# Patient Record
Sex: Female | Born: 1970 | Race: Black or African American | Hispanic: No | Smoking: Never smoker
Health system: Southern US, Community
[De-identification: ages and names within clinical notes are randomized; demographics above are authoritative.]

## PROBLEM LIST (undated history)

## (undated) VITALS — BP 117/77 | HR 64 | Temp 98.4°F | Resp 16

## (undated) VITALS — BP 111/68 | HR 95 | Temp 97.7°F | Resp 18

## (undated) DIAGNOSIS — G629 Polyneuropathy, unspecified: Secondary | ICD-10-CM

## (undated) DIAGNOSIS — D219 Benign neoplasm of connective and other soft tissue, unspecified: Secondary | ICD-10-CM

## (undated) DIAGNOSIS — E559 Vitamin D deficiency, unspecified: Secondary | ICD-10-CM

## (undated) DIAGNOSIS — K219 Gastro-esophageal reflux disease without esophagitis: Secondary | ICD-10-CM

## (undated) DIAGNOSIS — F325 Major depressive disorder, single episode, in full remission: Secondary | ICD-10-CM

## (undated) DIAGNOSIS — G988 Other disorders of nervous system: Secondary | ICD-10-CM

## (undated) DIAGNOSIS — I10 Essential (primary) hypertension: Secondary | ICD-10-CM

## (undated) DIAGNOSIS — I8290 Acute embolism and thrombosis of unspecified vein: Secondary | ICD-10-CM

## (undated) DIAGNOSIS — D649 Anemia, unspecified: Secondary | ICD-10-CM

## (undated) DIAGNOSIS — E669 Obesity, unspecified: Secondary | ICD-10-CM

## (undated) DIAGNOSIS — Z923 Personal history of irradiation: Secondary | ICD-10-CM

## (undated) DIAGNOSIS — M75100 Unspecified rotator cuff tear or rupture of unspecified shoulder, not specified as traumatic: Secondary | ICD-10-CM

## (undated) DIAGNOSIS — Z803 Family history of malignant neoplasm of breast: Secondary | ICD-10-CM

## (undated) DIAGNOSIS — I890 Lymphedema, not elsewhere classified: Secondary | ICD-10-CM

## (undated) DIAGNOSIS — C50919 Malignant neoplasm of unspecified site of unspecified female breast: Secondary | ICD-10-CM

## (undated) DIAGNOSIS — Z800 Family history of malignant neoplasm of digestive organs: Secondary | ICD-10-CM

## (undated) DIAGNOSIS — Z9221 Personal history of antineoplastic chemotherapy: Secondary | ICD-10-CM

## (undated) HISTORY — DX: Polyneuropathy, unspecified: G62.9

## (undated) HISTORY — DX: Family history of malignant neoplasm of digestive organs: Z80.0

## (undated) HISTORY — DX: Unspecified rotator cuff tear or rupture of unspecified shoulder, not specified as traumatic: M75.100

## (undated) HISTORY — PX: ABDOMINAL HYSTERECTOMY: SUR658

## (undated) HISTORY — PX: LYMPHADENECTOMY: SHX15

## (undated) HISTORY — DX: Family history of malignant neoplasm of breast: Z80.3

## (undated) HISTORY — PX: TUBAL LIGATION: SHX77

## (undated) HISTORY — PX: BREAST LUMPECTOMY: SHX2

## (undated) HISTORY — DX: Lymphedema, not elsewhere classified: I89.0

## (undated) HISTORY — DX: Other disorders of nervous system: G98.8

## (undated) HISTORY — DX: Major depressive disorder, single episode, in full remission: F32.5

## (undated) HISTORY — DX: Essential (primary) hypertension: I10

## (undated) HISTORY — DX: Obesity, unspecified: E66.9

## (undated) HISTORY — DX: Vitamin D deficiency, unspecified: E55.9

## (undated) HISTORY — DX: Benign neoplasm of connective and other soft tissue, unspecified: D21.9

---

## 1997-02-17 HISTORY — PX: TUBAL LIGATION: SHX77

## 2001-02-17 DIAGNOSIS — B009 Herpesviral infection, unspecified: Secondary | ICD-10-CM

## 2001-02-17 HISTORY — DX: Herpesviral infection, unspecified: B00.9

## 2002-04-30 ENCOUNTER — Emergency Department: Admit: 2002-04-30 | Payer: Self-pay | Source: Emergency Department | Admitting: Emergency Medical Services

## 2006-02-17 HISTORY — PX: SALPINGOOPHORECTOMY: SHX82

## 2006-02-17 HISTORY — PX: HYSTERECTOMY: SHX81

## 2006-03-23 ENCOUNTER — Ambulatory Visit
Admit: 2006-03-23 | Disposition: A | Discharge status: Home / Self Care | Payer: Self-pay | Source: Ambulatory Visit | Admitting: Gynecologic Oncology

## 2006-03-23 LAB — CBC- CERNER
Hematocrit: 24.2 % — ABNORMAL LOW (ref 37.0–47.0)
Hgb: 7.8 G/DL — ABNORMAL LOW (ref 12.0–16.0)
MCH: 22 PG — ABNORMAL LOW (ref 28.0–32.0)
MCHC: 32.2 G/DL (ref 32.0–36.0)
MCV: 68.4 FL — ABNORMAL LOW (ref 80.0–100.0)
MPV: 8.2 FL (ref 7.4–10.4)
Platelets: 256 /mm3 (ref 140–400)
RBC: 3.54 /mm3 — ABNORMAL LOW (ref 4.20–5.40)
RDW: 17.5 % — ABNORMAL HIGH (ref 11.5–15.0)
WBC: 5.7 /mm3 (ref 3.5–10.8)

## 2006-03-23 LAB — COMPREHENSIVE METABOLIC PANEL
ALT: 18 U/L (ref 3–36)
AST (SGOT): 27 U/L (ref 10–41)
Albumin/Globulin Ratio: 1.2 (ref 1.1–1.8)
Albumin: 4.1 g/dL (ref 3.4–4.9)
Alkaline Phosphatase: 77 U/L (ref 43–112)
BUN: 9 mg/dL (ref 8–20)
Bilirubin, Total: 0.2 mg/dL (ref 0.1–1.0)
CO2: 27 mEq/L (ref 21–30)
Calcium: 9 mg/dL (ref 8.6–10.2)
Chloride: 105 mEq/L (ref 98–107)
Creatinine: 0.9 mg/dL (ref 0.6–1.5)
Globulin: 3.3 g/dL (ref 2.0–3.7)
Glucose: 72 mg/dL (ref 70–100)
Potassium: 4 mEq/L (ref 3.6–5.0)
Protein, Total: 7.4 g/dL (ref 6.0–8.0)
Sodium: 140 mEq/L (ref 136–146)

## 2006-03-23 LAB — HCG QUANTITATIVE LEVEL - FH CERNER: TUMOR MARKER BHCG QUANT: 0 m[IU]/mL

## 2006-04-02 ENCOUNTER — Ambulatory Visit (HOSPITAL_BASED_OUTPATIENT_CLINIC_OR_DEPARTMENT_OTHER)
Admission: RE | Admit: 2006-04-02 | Disposition: A | Discharge status: Home / Self Care | Payer: Self-pay | Source: Ambulatory Visit | Admitting: Gynecologic Oncology

## 2006-04-02 LAB — CBC- CERNER
Hematocrit: 34.3 % — ABNORMAL LOW (ref 37.0–47.0)
Hgb: 11.2 G/DL — ABNORMAL LOW (ref 12.0–16.0)
MCH: 25.7 PG — ABNORMAL LOW (ref 28.0–32.0)
MCHC: 32.7 G/DL (ref 32.0–36.0)
MCV: 78.5 FL — ABNORMAL LOW (ref 80.0–100.0)
MPV: 7.6 FL (ref 7.4–10.4)
Platelets: 134 /mm3 — ABNORMAL LOW (ref 140–400)
RBC: 4.36 /mm3 (ref 4.20–5.40)
RDW: 23.1 % — ABNORMAL HIGH (ref 11.5–15.0)
WBC: 13.5 /mm3 — ABNORMAL HIGH (ref 3.5–10.8)

## 2006-04-02 LAB — CHLORIDE WHOLE BLOOD: Chloride, WB: 108 mEQ/L — ABNORMAL HIGH (ref 98–106)

## 2006-04-02 LAB — ABG WITH NA/K/CA IONIZED
Arterial Total CO2: 23.9 mEq/L — ABNORMAL LOW (ref 24.0–30.0)
Base Excess, Arterial: -0.9 mEq/L (ref <=2.0)
Calcium, Ionized: 2.3 MEQ/L (ref 2.30–2.58)
HCO3, Arterial: 22.8 mEq/L — ABNORMAL LOW (ref 23.0–29.0)
O2 Sat, Arterial: 98.4 % (ref 95.0–100.0)
Temperature: 36.1
Whole Blood Potassium: 3.4 mEQ/L — ABNORMAL LOW (ref 3.5–5.3)
Whole Blood Sodium: 140 mMEQ/L (ref 136–146)
pCO2, Arterial: 33.8 mmHg — ABNORMAL LOW (ref 35.0–45.0)
pH, Arterial: 7.44 (ref 7.350–7.450)
pO2, Arterial: 103 mmHg — ABNORMAL HIGH (ref 80.0–90.0)

## 2006-04-02 LAB — GLUCOSE WHOLE BLOOD: Whole Blood Glucose: 103 mg/dL — ABNORMAL HIGH (ref 70–100)

## 2006-04-02 LAB — PT AND APTT
PT INR: 1.1 {INR} (ref 0.9–1.1)
PT: 13.4 s — ABNORMAL HIGH (ref 10.8–13.3)
PTT: 25 s (ref 21–32)

## 2006-04-02 LAB — TOTAL HEMOGLOBIN GROUP
Hematocrit Calc: 19.2 % — ABNORMAL LOW (ref 37.0–47.0)
Hemoglobin Total: 6.1 G/DL — ABNORMAL LOW (ref 12.0–16.0)

## 2006-04-02 LAB — FIBRINOGEN: Fibrinogen: 214 MG/DL (ref 194–420)

## 2006-04-03 LAB — CBC- CERNER
Hematocrit: 32.4 % — ABNORMAL LOW (ref 37.0–47.0)
Hgb: 10.7 G/DL — ABNORMAL LOW (ref 12.0–16.0)
MCH: 26.1 PG — ABNORMAL LOW (ref 28.0–32.0)
MCHC: 33.1 G/DL (ref 32.0–36.0)
MCV: 78.8 FL — ABNORMAL LOW (ref 80.0–100.0)
MPV: 8.5 FL (ref 7.4–10.4)
Platelets: 147 /mm3 (ref 140–400)
RBC: 4.12 /mm3 — ABNORMAL LOW (ref 4.20–5.40)
RDW: 21.9 % — ABNORMAL HIGH (ref 11.5–15.0)
WBC: 14 /mm3 — ABNORMAL HIGH (ref 3.5–10.8)

## 2006-04-03 LAB — BASIC METABOLIC PANEL
BUN: 7 mg/dL — ABNORMAL LOW (ref 8–20)
CO2: 26 mEq/L (ref 21–30)
Calcium: 7.9 mg/dL — ABNORMAL LOW (ref 8.6–10.2)
Chloride: 105 mEq/L (ref 98–107)
Creatinine: 0.9 mg/dL (ref 0.6–1.5)
Glucose: 103 mg/dL — ABNORMAL HIGH (ref 70–100)
Potassium: 4.2 mEq/L (ref 3.6–5.0)
Sodium: 136 mEq/L (ref 136–146)

## 2006-04-03 LAB — GFR

## 2006-04-04 LAB — CBC- CERNER
Hematocrit: 30.1 % — ABNORMAL LOW (ref 37.0–47.0)
Hgb: 10 G/DL — ABNORMAL LOW (ref 12.0–16.0)
MCH: 26.3 PG — ABNORMAL LOW (ref 28.0–32.0)
MCHC: 33.2 G/DL (ref 32.0–36.0)
MCV: 79.4 FL — ABNORMAL LOW (ref 80.0–100.0)
MPV: 8.4 FL (ref 7.4–10.4)
Platelets: 127 /mm3 — ABNORMAL LOW (ref 140–400)
RBC: 3.79 /mm3 — ABNORMAL LOW (ref 4.20–5.40)
RDW: 23 % — ABNORMAL HIGH (ref 11.5–15.0)
WBC: 10 /mm3 (ref 3.5–10.8)

## 2006-04-05 LAB — TYPE AND XMATCH CERNER: AB Screen Gel: NEGATIVE

## 2006-04-09 ENCOUNTER — Inpatient Hospital Stay (HOSPITAL_BASED_OUTPATIENT_CLINIC_OR_DEPARTMENT_OTHER)
Admission: AD | Admit: 2006-04-09 | Disposition: A | Discharge status: Home / Self Care | Payer: Self-pay | Source: Other Acute Inpatient Hospital | Admitting: Female Pelvic Medicine and Reconstructive Surgery

## 2006-04-10 LAB — BASIC METABOLIC PANEL
BUN: 20 mg/dL (ref 8–20)
CO2: 31 mEq/L — ABNORMAL HIGH (ref 21–30)
Calcium: 8.3 mg/dL — ABNORMAL LOW (ref 8.6–10.2)
Chloride: 100 mEq/L (ref 98–107)
Creatinine: 1.1 mg/dL (ref 0.6–1.5)
Glucose: 96 mg/dL (ref 70–100)
Potassium: 3.3 mEq/L — ABNORMAL LOW (ref 3.6–5.0)
Sodium: 141 mEq/L (ref 136–146)

## 2006-04-10 LAB — CBC WITH AUTO DIFFERENTIAL CERNER
Basophils Absolute: 0 /mm3 (ref 0.0–0.2)
Basophils: 0 % (ref 0–2)
Eosinophils Absolute: 0.1 /mm3 (ref 0.0–0.7)
Eosinophils: 1 % (ref 0–5)
Granulocytes Absolute: 9.8 /mm3 — ABNORMAL HIGH (ref 1.8–8.1)
Hematocrit: 27.3 % — ABNORMAL LOW (ref 37.0–47.0)
Hgb: 9 G/DL — ABNORMAL LOW (ref 12.0–16.0)
Lymphocytes Absolute: 1 /mm3 (ref 0.5–4.4)
Lymphocytes: 9 % — ABNORMAL LOW (ref 15–41)
MCH: 25.9 PG — ABNORMAL LOW (ref 28.0–32.0)
MCHC: 33.1 G/DL (ref 32.0–36.0)
MCV: 78.3 FL — ABNORMAL LOW (ref 80.0–100.0)
MPV: 8.3 FL (ref 7.4–10.4)
Monocytes Absolute: 1.1 /mm3 (ref 0.0–1.2)
Monocytes: 9 % (ref 0–11)
Neutrophils %: 81 % — ABNORMAL HIGH (ref 52–75)
Platelets: 306 /mm3 (ref 140–400)
RBC: 3.48 /mm3 — ABNORMAL LOW (ref 4.20–5.40)
RDW: 23.8 % — ABNORMAL HIGH (ref 11.5–15.0)
WBC: 12 /mm3 — ABNORMAL HIGH (ref 3.5–10.8)

## 2006-04-10 LAB — CBC- CERNER
Hematocrit: 26.6 % — ABNORMAL LOW (ref 37.0–47.0)
Hgb: 8.8 G/DL — ABNORMAL LOW (ref 12.0–16.0)
MCH: 25.4 PG — ABNORMAL LOW (ref 28.0–32.0)
MCHC: 33 G/DL (ref 32.0–36.0)
MCV: 77.1 FL — ABNORMAL LOW (ref 80.0–100.0)
MPV: 8.1 FL (ref 7.4–10.4)
Platelets: 328 /mm3 (ref 140–400)
RBC: 3.45 /mm3 — ABNORMAL LOW (ref 4.20–5.40)
RDW: 24.3 % — ABNORMAL HIGH (ref 11.5–15.0)
WBC: 10.5 /mm3 (ref 3.5–10.8)

## 2006-04-10 LAB — TYPE AND SCREEN: AB Screen Gel: NEGATIVE

## 2006-04-10 LAB — GFR

## 2006-04-10 LAB — PT AND APTT
PT INR: 1.2 {INR} — ABNORMAL HIGH (ref 0.9–1.1)
PT: 13.6 s — ABNORMAL HIGH (ref 10.8–13.3)
PTT: 33 s — ABNORMAL HIGH (ref 21–32)

## 2006-04-10 LAB — MAGNESIUM: Magnesium: 1.9 mg/dL (ref 1.6–2.3)

## 2006-04-10 LAB — PHOSPHORUS: Phosphorus: 3.3 mg/dL (ref 2.5–4.5)

## 2006-04-11 LAB — COMPREHENSIVE METABOLIC PANEL
ALT: 21 U/L (ref 3–36)
AST (SGOT): 24 U/L (ref 10–41)
Albumin/Globulin Ratio: 0.7 — ABNORMAL LOW (ref 1.1–1.8)
Albumin: 2.7 g/dL — ABNORMAL LOW (ref 3.4–4.9)
Alkaline Phosphatase: 114 U/L — ABNORMAL HIGH (ref 43–112)
BUN: 15 mg/dL (ref 8–20)
Bilirubin, Total: 0.4 mg/dL (ref 0.1–1.0)
CO2: 31 mEq/L — ABNORMAL HIGH (ref 21–30)
Calcium: 7.8 mg/dL — ABNORMAL LOW (ref 8.6–10.2)
Chloride: 98 mEq/L (ref 98–107)
Creatinine: 1.1 mg/dL (ref 0.6–1.5)
Globulin: 3.8 g/dL — ABNORMAL HIGH (ref 2.0–3.7)
Glucose: 115 mg/dL — ABNORMAL HIGH (ref 70–100)
Potassium: 3.5 mEq/L — ABNORMAL LOW (ref 3.6–5.0)
Protein, Total: 6.5 g/dL (ref 6.0–8.0)
Sodium: 141 mEq/L (ref 136–146)

## 2006-04-11 LAB — CBC WITH AUTO DIFFERENTIAL CERNER
Basophils Absolute: 0 /mm3 (ref 0.0–0.2)
Basophils: 0 % (ref 0–2)
Eosinophils Absolute: 0.1 /mm3 (ref 0.0–0.7)
Eosinophils: 1 % (ref 0–5)
Granulocytes Absolute: 11.7 /mm3 — ABNORMAL HIGH (ref 1.8–8.1)
Hematocrit: 24.2 % — ABNORMAL LOW (ref 37.0–47.0)
Hgb: 8.1 G/DL — ABNORMAL LOW (ref 12.0–16.0)
Lymphocytes Absolute: 0.6 /mm3 (ref 0.5–4.4)
Lymphocytes: 4 % — ABNORMAL LOW (ref 15–41)
MCH: 26.3 PG — ABNORMAL LOW (ref 28.0–32.0)
MCHC: 33.6 G/DL (ref 32.0–36.0)
MCV: 78.1 FL — ABNORMAL LOW (ref 80.0–100.0)
MPV: 8.4 FL (ref 7.4–10.4)
Monocytes Absolute: 1.1 /mm3 (ref 0.0–1.2)
Monocytes: 8 % (ref 0–11)
Neutrophils %: 87 % — ABNORMAL HIGH (ref 52–75)
Platelets: 335 /mm3 (ref 140–400)
RBC: 3.1 /mm3 — ABNORMAL LOW (ref 4.20–5.40)
RDW: 24.2 % — ABNORMAL HIGH (ref 11.5–15.0)
WBC: 13.5 /mm3 — ABNORMAL HIGH (ref 3.5–10.8)

## 2006-04-11 LAB — MAGNESIUM: Magnesium: 2.1 mg/dL (ref 1.6–2.3)

## 2006-04-11 LAB — LIPASE: Lipase: 59 U/L (ref 32–219)

## 2006-04-11 LAB — AMYLASE: Amylase: 33 U/L (ref 5–90)

## 2006-04-11 LAB — GFR

## 2006-04-11 LAB — PHOSPHORUS: Phosphorus: 3 mg/dL (ref 2.5–4.5)

## 2006-04-12 LAB — CBC WITH MANUAL DIFF- CERNER
Bands: 6 % (ref 0–9)
Hematocrit: 22.8 % — ABNORMAL LOW (ref 37.0–47.0)
Hgb: 7.7 G/DL — ABNORMAL LOW (ref 12.0–16.0)
Lymphocytes Manual: 5 % — ABNORMAL LOW (ref 15–41)
MCH: 26.1 PG — ABNORMAL LOW (ref 28.0–32.0)
MCHC: 33.5 G/DL (ref 32.0–36.0)
MCV: 78 FL — ABNORMAL LOW (ref 80.0–100.0)
MPV: 8.3 FL (ref 7.4–10.4)
Monocytes Manual: 3 % (ref 0–11)
Neutrophils %: 86 % — ABNORMAL HIGH (ref 52–75)
Platelets: 401 /mm3 — ABNORMAL HIGH (ref 140–400)
RBC Morphology: ABNORMAL
RBC: 2.93 /mm3 — ABNORMAL LOW (ref 4.20–5.40)
RDW: 25.2 % — ABNORMAL HIGH (ref 11.5–15.0)
WBC: 18.8 /mm3 — ABNORMAL HIGH (ref 3.5–10.8)

## 2006-04-12 LAB — PHOSPHORUS: Phosphorus: 3.6 mg/dL (ref 2.5–4.5)

## 2006-04-12 LAB — GFR

## 2006-04-12 LAB — MAGNESIUM: Magnesium: 2 mg/dL (ref 1.6–2.3)

## 2006-04-12 LAB — BASIC METABOLIC PANEL
BUN: 14 mg/dL (ref 8–20)
CO2: 33 mEq/L — ABNORMAL HIGH (ref 21–30)
Calcium: 8.1 mg/dL — ABNORMAL LOW (ref 8.6–10.2)
Chloride: 99 mEq/L (ref 98–107)
Creatinine: 1.7 mg/dL — ABNORMAL HIGH (ref 0.6–1.5)
Glucose: 111 mg/dL — ABNORMAL HIGH (ref 70–100)
Potassium: 3.3 mEq/L — ABNORMAL LOW (ref 3.6–5.0)
Sodium: 141 mEq/L (ref 136–146)

## 2006-04-12 LAB — VANCOMYCIN, RANDOM: Vancomycin Random: 4 ug/mL

## 2006-04-13 LAB — COMPREHENSIVE METABOLIC PANEL
ALT: 24 U/L (ref 3–36)
ALT: 20 U/L (ref 3–36)
AST (SGOT): 29 U/L (ref 10–41)
AST (SGOT): 28 U/L (ref 10–41)
Albumin/Globulin Ratio: 0.8 — ABNORMAL LOW (ref 1.1–1.8)
Albumin/Globulin Ratio: 0.8 — ABNORMAL LOW (ref 1.1–1.8)
Albumin: 2.8 g/dL — ABNORMAL LOW (ref 3.4–4.9)
Albumin: 2.7 g/dL — ABNORMAL LOW (ref 3.4–4.9)
Alkaline Phosphatase: 138 U/L — ABNORMAL HIGH (ref 43–112)
Alkaline Phosphatase: 132 U/L — ABNORMAL HIGH (ref 43–112)
BUN: 12 mg/dL (ref 8–20)
BUN: 14 mg/dL (ref 8–20)
Bilirubin, Total: 0.4 mg/dL (ref 0.1–1.0)
Bilirubin, Total: 0.3 mg/dL (ref 0.1–1.0)
CO2: 33 mEq/L — ABNORMAL HIGH (ref 21–30)
CO2: 32 mEq/L — ABNORMAL HIGH (ref 21–30)
Calcium: 8.6 mg/dL (ref 8.6–10.2)
Calcium: 8.4 mg/dL — ABNORMAL LOW (ref 8.6–10.2)
Chloride: 102 mEq/L (ref 98–107)
Chloride: 102 mEq/L (ref 98–107)
Creatinine: 1.5 mg/dL (ref 0.6–1.5)
Creatinine: 1.4 mg/dL (ref 0.6–1.5)
Globulin: 3.6 g/dL (ref 2.0–3.7)
Globulin: 3.3 g/dL (ref 2.0–3.7)
Glucose: 91 mg/dL (ref 70–100)
Glucose: 135 mg/dL — ABNORMAL HIGH (ref 70–100)
Potassium: 3.4 mEq/L — ABNORMAL LOW (ref 3.6–5.0)
Potassium: 3.6 mEq/L (ref 3.6–5.0)
Protein, Total: 6.4 g/dL (ref 6.0–8.0)
Protein, Total: 6 g/dL (ref 6.0–8.0)
Sodium: 143 mEq/L (ref 136–146)
Sodium: 141 mEq/L (ref 136–146)

## 2006-04-13 LAB — CBC WITH AUTO DIFFERENTIAL CERNER
Basophils Absolute: 0.1 /mm3 (ref 0.0–0.2)
Basophils: 1 % (ref 0–2)
Eosinophils Absolute: 0.1 /mm3 (ref 0.0–0.7)
Eosinophils: 1 % (ref 0–5)
Granulocytes Absolute: 16.7 /mm3 — ABNORMAL HIGH (ref 1.8–8.1)
Hematocrit: 22.9 % — ABNORMAL LOW (ref 37.0–47.0)
Hgb: 7.8 G/DL — ABNORMAL LOW (ref 12.0–16.0)
Lymphocytes Absolute: 0.8 /mm3 (ref 0.5–4.4)
Lymphocytes: 4 % — ABNORMAL LOW (ref 15–41)
MCH: 26.6 PG — ABNORMAL LOW (ref 28.0–32.0)
MCHC: 34.2 G/DL (ref 32.0–36.0)
MCV: 78 FL — ABNORMAL LOW (ref 80.0–100.0)
MPV: 8.6 FL (ref 7.4–10.4)
Monocytes Absolute: 1.2 /mm3 (ref 0.0–1.2)
Monocytes: 6 % (ref 0–11)
Neutrophils %: 89 % — ABNORMAL HIGH (ref 52–75)
Platelets: 490 /mm3 — ABNORMAL HIGH (ref 140–400)
RBC: 2.94 /mm3 — ABNORMAL LOW (ref 4.20–5.40)
RDW: 25.5 % — ABNORMAL HIGH (ref 11.5–15.0)
WBC: 18.9 /mm3 — ABNORMAL HIGH (ref 3.5–10.8)

## 2006-04-13 LAB — PT/INR
PT INR: 1.4 {INR} — ABNORMAL HIGH (ref 0.9–1.1)
PT: 15.9 s — ABNORMAL HIGH (ref 10.8–13.3)

## 2006-04-13 LAB — PT AND APTT
PT INR: 1.5 {INR} — ABNORMAL HIGH (ref 0.9–1.1)
PT: 16.7 s — ABNORMAL HIGH (ref 10.8–13.3)
PTT: 30 s (ref 21–32)

## 2006-04-13 LAB — PHOSPHORUS
Phosphorus: 3.6 mg/dL (ref 2.5–4.5)
Phosphorus: 3.3 mg/dL (ref 2.5–4.5)

## 2006-04-13 LAB — MAGNESIUM
Magnesium: 2.1 mg/dL (ref 1.6–2.3)
Magnesium: 2.3 mg/dL (ref 1.6–2.3)

## 2006-04-13 LAB — TRIGLYCERIDES
Triglycerides: 64 mg/dL (ref 35–135)
Triglycerides: 86 mg/dL (ref 35–135)

## 2006-04-13 LAB — CBC- CERNER
Hematocrit: 23.2 % — ABNORMAL LOW (ref 37.0–47.0)
Hgb: 7.6 G/DL — ABNORMAL LOW (ref 12.0–16.0)
MCH: 25.7 PG — ABNORMAL LOW (ref 28.0–32.0)
MCHC: 32.7 G/DL (ref 32.0–36.0)
MCV: 78.7 FL — ABNORMAL LOW (ref 80.0–100.0)
MPV: 8.4 FL (ref 7.4–10.4)
Platelets: 509 /mm3 — ABNORMAL HIGH (ref 140–400)
RBC: 2.95 /mm3 — ABNORMAL LOW (ref 4.20–5.40)
RDW: 25 % — ABNORMAL HIGH (ref 11.5–15.0)
WBC: 19.4 /mm3 — ABNORMAL HIGH (ref 3.5–10.8)

## 2006-04-13 LAB — PREALBUMIN
Prealbumin: 5.8 mg/dL — ABNORMAL LOW (ref 19.9–41.9)
Prealbumin: 5.6 mg/dL — ABNORMAL LOW (ref 19.9–41.9)

## 2006-04-14 LAB — CBC WITH AUTO DIFFERENTIAL CERNER
Basophils Absolute: 0.1 /mm3 (ref 0.0–0.2)
Basophils: 0 % (ref 0–2)
Eosinophils Absolute: 0.2 /mm3 (ref 0.0–0.7)
Eosinophils: 1 % (ref 0–5)
Granulocytes Absolute: 18 /mm3 — ABNORMAL HIGH (ref 1.8–8.1)
Hematocrit: 28.8 % — ABNORMAL LOW (ref 37.0–47.0)
Hgb: 9.9 G/DL — ABNORMAL LOW (ref 12.0–16.0)
Lymphocytes Absolute: 1.4 /mm3 (ref 0.5–4.4)
Lymphocytes: 6 % — ABNORMAL LOW (ref 15–41)
MCH: 27.2 PG — ABNORMAL LOW (ref 28.0–32.0)
MCHC: 34.2 G/DL (ref 32.0–36.0)
MCV: 79.4 FL — ABNORMAL LOW (ref 80.0–100.0)
MPV: 8.8 FL (ref 7.4–10.4)
Monocytes Absolute: 2 /mm3 — ABNORMAL HIGH (ref 0.0–1.2)
Monocytes: 9 % (ref 0–11)
Neutrophils %: 83 % — ABNORMAL HIGH (ref 52–75)
Platelets: 557 /mm3 — ABNORMAL HIGH (ref 140–400)
RBC: 3.63 /mm3 — ABNORMAL LOW (ref 4.20–5.40)
RDW: 23.3 % — ABNORMAL HIGH (ref 11.5–15.0)
WBC: 21.6 /mm3 — ABNORMAL HIGH (ref 3.5–10.8)

## 2006-04-14 LAB — BASIC METABOLIC PANEL
BUN: 18 mg/dL (ref 8–20)
CO2: 28 mEq/L (ref 21–30)
Calcium: 8.9 mg/dL (ref 8.6–10.2)
Chloride: 104 mEq/L (ref 98–107)
Creatinine: 1.2 mg/dL (ref 0.6–1.5)
Glucose: 152 mg/dL — ABNORMAL HIGH (ref 70–100)
Potassium: 3.8 mEq/L (ref 3.6–5.0)
Sodium: 139 mEq/L (ref 136–146)

## 2006-04-14 LAB — GFR

## 2006-04-15 LAB — BASIC METABOLIC PANEL
BUN: 16 mg/dL (ref 8–20)
CO2: 26 mEq/L (ref 21–30)
Calcium: 8.2 mg/dL — ABNORMAL LOW (ref 8.6–10.2)
Chloride: 101 mEq/L (ref 98–107)
Creatinine: 1 mg/dL (ref 0.6–1.5)
Glucose: 253 mg/dL — ABNORMAL HIGH (ref 70–100)
Potassium: 3.9 mEq/L (ref 3.6–5.0)
Sodium: 137 mEq/L (ref 136–146)

## 2006-04-15 LAB — GFR

## 2006-04-16 LAB — MAGNESIUM: Magnesium: 2.1 mg/dL (ref 1.6–2.3)

## 2006-04-16 LAB — CBC WITH AUTO DIFFERENTIAL CERNER
Basophils Absolute: 0.1 /mm3 (ref 0.0–0.2)
Basophils: 1 % (ref 0–2)
Eosinophils Absolute: 0.4 /mm3 (ref 0.0–0.7)
Eosinophils: 3 % (ref 0–5)
Granulocytes Absolute: 10 /mm3 — ABNORMAL HIGH (ref 1.8–8.1)
Hematocrit: 28.6 % — ABNORMAL LOW (ref 37.0–47.0)
Hgb: 9.7 G/DL — ABNORMAL LOW (ref 12.0–16.0)
Lymphocytes Absolute: 1.6 /mm3 (ref 0.5–4.4)
Lymphocytes: 12 % — ABNORMAL LOW (ref 15–41)
MCH: 26.8 PG — ABNORMAL LOW (ref 28.0–32.0)
MCHC: 33.9 G/DL (ref 32.0–36.0)
MCV: 79 FL — ABNORMAL LOW (ref 80.0–100.0)
MPV: 8.6 FL (ref 7.4–10.4)
Monocytes Absolute: 1.4 /mm3 — ABNORMAL HIGH (ref 0.0–1.2)
Monocytes: 11 % (ref 0–11)
Neutrophils %: 74 % (ref 52–75)
Platelets: 539 /mm3 — ABNORMAL HIGH (ref 140–400)
RBC: 3.62 /mm3 — ABNORMAL LOW (ref 4.20–5.40)
RDW: 23.4 % — ABNORMAL HIGH (ref 11.5–15.0)
WBC: 13.4 /mm3 — ABNORMAL HIGH (ref 3.5–10.8)

## 2006-04-16 LAB — PHOSPHORUS: Phosphorus: 3.6 mg/dL (ref 2.5–4.5)

## 2006-04-16 LAB — COMPREHENSIVE METABOLIC PANEL
ALT: 19 U/L (ref 3–36)
AST (SGOT): 22 U/L (ref 10–41)
Albumin/Globulin Ratio: 0.7 — ABNORMAL LOW (ref 1.1–1.8)
Albumin: 2.5 g/dL — ABNORMAL LOW (ref 3.4–4.9)
Alkaline Phosphatase: 97 U/L (ref 43–112)
BUN: 11 mg/dL (ref 8–20)
Bilirubin, Total: 0.2 mg/dL (ref 0.1–1.0)
CO2: 25 mEq/L (ref 21–30)
Calcium: 8.5 mg/dL — ABNORMAL LOW (ref 8.6–10.2)
Chloride: 107 mEq/L (ref 98–107)
Creatinine: 1 mg/dL (ref 0.6–1.5)
Globulin: 3.6 g/dL (ref 2.0–3.7)
Glucose: 224 mg/dL — ABNORMAL HIGH (ref 70–100)
Potassium: 4.3 mEq/L (ref 3.6–5.0)
Protein, Total: 6.1 g/dL (ref 6.0–8.0)
Sodium: 137 mEq/L (ref 136–146)

## 2006-04-16 LAB — TYPE AND XMATCH - IFH ONLY CERNER
# of Units: 2
AB Screen Gel: NEGATIVE

## 2006-04-16 LAB — PREALBUMIN: Prealbumin: 11.9 mg/dL — ABNORMAL LOW (ref 19.9–41.9)

## 2006-04-16 LAB — TRIGLYCERIDES: Triglycerides: 101 mg/dL (ref 35–135)

## 2006-04-19 LAB — CBC WITH AUTO DIFFERENTIAL CERNER
Basophils Absolute: 0 /mm3 (ref 0.0–0.2)
Basophils: 0 % (ref 0–2)
Eosinophils Absolute: 0.1 /mm3 (ref 0.0–0.7)
Eosinophils: 2 % (ref 0–5)
Granulocytes Absolute: 6.5 /mm3 (ref 1.8–8.1)
Hematocrit: 28.5 % — ABNORMAL LOW (ref 37.0–47.0)
Hgb: 9.5 G/DL — ABNORMAL LOW (ref 12.0–16.0)
Lymphocytes Absolute: 1.2 /mm3 (ref 0.5–4.4)
Lymphocytes: 14 % — ABNORMAL LOW (ref 15–41)
MCH: 26.4 PG — ABNORMAL LOW (ref 28.0–32.0)
MCHC: 33.5 G/DL (ref 32.0–36.0)
MCV: 79 FL — ABNORMAL LOW (ref 80.0–100.0)
MPV: 8.8 FL (ref 7.4–10.4)
Monocytes Absolute: 0.9 /mm3 (ref 0.0–1.2)
Monocytes: 10 % (ref 0–11)
Neutrophils %: 75 % (ref 52–75)
Platelets: 414 /mm3 — ABNORMAL HIGH (ref 140–400)
RBC: 3.6 /mm3 — ABNORMAL LOW (ref 4.20–5.40)
RDW: 22.5 % — ABNORMAL HIGH (ref 11.5–15.0)
WBC: 8.7 /mm3 (ref 3.5–10.8)

## 2006-04-20 LAB — CBC WITH AUTO DIFFERENTIAL CERNER
Basophils Absolute: 0 /mm3 (ref 0.0–0.2)
Basophils: 1 % (ref 0–2)
Eosinophils Absolute: 0.1 /mm3 (ref 0.0–0.7)
Eosinophils: 2 % (ref 0–5)
Granulocytes Absolute: 5.2 /mm3 (ref 1.8–8.1)
Hematocrit: 30.2 % — ABNORMAL LOW (ref 37.0–47.0)
Hgb: 9.9 G/DL — ABNORMAL LOW (ref 12.0–16.0)
Lymphocytes Absolute: 1.1 /mm3 (ref 0.5–4.4)
Lymphocytes: 15 % (ref 15–41)
MCH: 26.5 PG — ABNORMAL LOW (ref 28.0–32.0)
MCHC: 32.8 G/DL (ref 32.0–36.0)
MCV: 80.8 FL (ref 80.0–100.0)
MPV: 8 FL (ref 7.4–10.4)
Monocytes Absolute: 0.7 /mm3 (ref 0.0–1.2)
Monocytes: 10 % (ref 0–11)
Neutrophils %: 73 % (ref 52–75)
Platelets: 451 /mm3 — ABNORMAL HIGH (ref 140–400)
RBC: 3.74 /mm3 — ABNORMAL LOW (ref 4.20–5.40)
RDW: 22.9 % — ABNORMAL HIGH (ref 11.5–15.0)
WBC: 7.1 /mm3 (ref 3.5–10.8)

## 2009-12-17 ENCOUNTER — Ambulatory Visit
Admit: 2009-12-17 | Disposition: A | Discharge status: Home / Self Care | Payer: Self-pay | Source: Ambulatory Visit | Admitting: Internal Medicine

## 2009-12-17 LAB — LIPID PANEL
Cholesterol / HDL Ratio: 3.8 Index
Cholesterol: 166 mg/dL (ref 0–199)
HDL: 44 mg/dL (ref >40)
LDL Calculated: 110 mg/dL — ABNORMAL HIGH (ref 0–99)
Triglycerides: 62 mg/dL (ref 34–149)
VLDL Calculated: 12 mg/dL (ref 10–40)

## 2009-12-17 LAB — COMPREHENSIVE METABOLIC PANEL
ALT: 25 U/L (ref 7–56)
AST (SGOT): 17 U/L (ref 5–40)
Albumin/Globulin Ratio: 1.1 (ref 1.1–1.8)
Albumin: 4.1 g/dL (ref 3.7–5.1)
Alkaline Phosphatase: 108 U/L (ref 43–122)
Anion Gap: 10 (ref 5.0–15.0)
BUN: 9 mg/dL (ref 7–21)
Bilirubin, Total: 0.5 mg/dL (ref 0.2–1.3)
CO2: 28 mEq/L (ref 22–31)
Calcium: 9.5 mg/dL (ref 8.6–10.2)
Chloride: 104 mEq/L (ref 98–107)
Creatinine: 0.9 mg/dL (ref 0.5–1.4)
Globulin: 3.6 g/dL (ref 2.0–3.7)
Glucose: 78 mg/dL (ref 70–100)
Potassium: 4 mEq/L (ref 3.6–5.0)
Protein, Total: 7.7 g/dL (ref 6.0–8.0)
Sodium: 142 mEq/L (ref 136–143)

## 2009-12-17 LAB — CBC
Hematocrit: 40.2 % (ref 37.0–47.0)
Hgb: 13.1 g/dL (ref 12.0–16.0)
MCH: 28 pg (ref 28.0–32.0)
MCHC: 32.6 g/dL (ref 32.0–36.0)
MCV: 85.9 fL (ref 80.0–100.0)
MPV: 11.2 fL (ref 9.4–12.3)
Platelets: 199 10*3/uL (ref 140–400)
RBC: 4.68 10*6/uL (ref 4.20–5.40)
RDW: 13 % (ref 12–15)
WBC: 6.63 10*3/uL (ref 3.50–10.80)

## 2009-12-17 LAB — TSH: TSH: 0.54 u[IU]/mL (ref 0.35–4.94)

## 2009-12-17 LAB — URINALYSIS, REFLEX TO MICROSCOPIC EXAM IF INDICATED
Bilirubin, UA: NEGATIVE
Blood, UA: NEGATIVE
Glucose, UA: NEGATIVE
Ketones UA: NEGATIVE
Leukocyte Esterase, UA: NEGATIVE
Nitrite, UA: NEGATIVE
Specific Gravity UA POCT: 1.02 (ref 1.001–1.035)
Urine pH: 6.5 (ref 5.0–8.0)
Urobilinogen, UA: NORMAL mg/dL

## 2009-12-17 LAB — HEMOLYSIS INDEX: Hemolysis Index: 10 Index — ABNORMAL HIGH (ref 0–9)

## 2009-12-17 LAB — GFR: EGFR: >60

## 2010-06-19 ENCOUNTER — Ambulatory Visit: Admit: 2010-06-19 | Discharge: 2010-06-19 | Payer: Self-pay | Source: Ambulatory Visit

## 2010-09-20 ENCOUNTER — Ambulatory Visit: Admit: 2010-09-20 | Discharge: 2010-09-20 | Payer: Self-pay | Source: Ambulatory Visit

## 2010-11-23 LAB — ECG 12-LEAD
Atrial Rate: 69 {beats}/min
P Axis: 57 degrees
P-R Interval: 160 ms
Q-T Interval: 384 ms
QRS Duration: 78 ms
QTC Calculation (Bezet): 411 ms
R Axis: 51 degrees
T Axis: 52 degrees
Ventricular Rate: 69 {beats}/min

## 2010-12-05 NOTE — Op Note (Signed)
DATE OF BIRTH:                        05/16/1970      ADMISSION DATE:                     04/02/2006            PATIENT LOCATION:                     [id number]            DATE OF PROCEDURE:      SURGEON:                            Zackery Barefoot, MD      ASSISTANT(S):                  PREOPERATIVE DIAGNOSIS:  SYMPTOMATIC LEIOMYOMATA UTERI.            POSTOPERATIVE DIAGNOSES:      1.   SYMPTOMATIC LEIOMYOMATA UTERI.      2.   EXTENSIVE PELVIC ADHESIVE DISEASE.            PROCEDURES:      1.   DIAGNOSTIC LAPAROSCOPY.      2.   EXPLORATORY LAPAROTOMY THROUGH A CHERNEY INCISION.      3.   TOTAL ABDOMINAL HYSTERECTOMY.      4.   RIGHT SALPINGO-OOPHORECTOMY.      5.   BILATERAL HYPOGASTRIC ARTERY LIGATION.      6.   REPAIR OF RECTOSIGMOID HYSTEROTOMY.            ANESTHETIC:  General via endotracheal tube.            ESTIMATED BLOOD LOSS:  1700 mL.            DRAINS:      1.   Foley to gravity.      2.   J-P in pelvis.            REPLACEMENT:  Four units packed red blood cells transfused.            DESCRIPTION OF PROCEDURE:  After the risks, benefits, indications and      alternatives of the procedure were reviewed with the patient and the family      and informed consent was obtained, she was taken to the operating room      where, adequate general anesthesia was obtained, she was prepped and draped      in usual sterile fashion  in the low lithotomy position  for abdominal      surgery.  A Foley catheter was placed.  A sponge stick was then placed in      the vagina.  A 5-mm periumbilical incision was made.  A pneumoperitoneum      was obtained after placing a 5-mm trocar under direct visualization using      an Ethicon Optiview port.  Two lateral 10-mm ports were then placed under      direct visualization after the pneumoperitoneum was obtained.  A 20-22 week      size myomatous uterus which, upon manipulation, was noted to be densely      adherent to the rectosigmoid colon and bladder flap.  Given the extensive       pelvic adhesive disease and the lack of mobilization of the  uterus, a      Pfannenstiel incision was made which was then converted to a Cherney to      allow better visualization and access laterally.  The rectus tendon was      transected at the pubic symphysis.  The peritoneum was then identified,      elevated and entered sharply.  Now the round ligaments were suture ligated      and transected bilaterally.  A left polycystic but normal-appearing ovary      was noted.  The right ovary was noted to be encased with myomas.  The      peritoneum overlying the external iliac vessels was incised bilaterally.      The round ligaments were suture ligated and then transected bilaterally.      The bladder flap was then created, dissecting the bladder off the lower      uterine segment, cervix and proximal vagina.  The right infundibulopelvic      ligament was doubly clamped, transected and suture ligated bilaterally.      The left utero-ovarian ligament was doubly clamped, transected and suture      ligated bilaterally.  Then an extensive posterior pelvic adhesiolysis was      performed mobilizing the corpus from the rectosigmoid colon and pararectal      spaces where the myomas had appeared to grow retroperitoneally.  The      pararectal and paravesical spaces were then created after the rectosigmoid      colon was mobilized sharply away.  A small serosal rectosigmoid defect was      noted.  This was oversewn using 3-0 Vicryl in an interrupted fashion.  A      bilateral hypogastric artery ligation was then performed having created the      pararectal and paravesical spaces bilaterally.  The hypogastric, as well as      external iliac vessels were identified approximately 2 cm from the      bifurcation.  The hypogastric arteries were doubly ligated using a 0 Vicryl      suture.  At this time, the ureters were mobilized laterally.  Of note, a      left hydroureter was appreciated.  The ureters, using sharp dissection,       were mobilized from their medial attachments to the corpus and cervix.      Then the cardinal ligaments were doubly clamped, transected and suture      ligated bilaterally.  The uterosacral ligaments were doubly clamped,      transected and suture ligated bilaterally.  All were done under direct      visualization of the ureters at all time.  Again the rectosigmoid colon had      been mobilized away.  Now the uterus, cervix, right tube and ovary were      then amputated and the proximal vaginal cuff closed using interrupted      sutures of 2-0 Vicryl suture.  Initial attempt at a supracervical      hysterectomy was made, however given the myomas in the lower uterine      segment and upper endocervix and continued bleeding after the attempt at      supracervical hysterectomy was made, the decision was made to perform a      total hysterectomy which was done and hemostasis assured.  Proctoscopy and      sigmoidoscopy were then performed to ensure no area of defect and a bubble  test was also performed to ensure integrity.  The abdomen and pelvis were      copiously irrigated.  Hemostasis was assured at all dissection sites.      Avitene and Gelfoam were placed at sites of dissection.  Seprafilm was then      placed.  A J-P drain was placed in the pelvis.  The rectus was then      reapproximated to the symphysis pubis.  The fascia was closed with a looped      PDS suture and the skin was closed with staples.  The J-P drain was secured      to the anterior abdominal wall using a 2-0 nylon suture.  The patient was      escorted awake and in stable condition, escorted via anesthesia to the      recovery room.                                          Electronic Signing MD: Zackery Barefoot, MD  ([id number])            D: 04/05/2006 by Zackery Barefoot, MD      T: 04/06/2006 by [id number] ([id number]) ([id number])      cc:  Zackery Barefoot, MD

## 2010-12-05 NOTE — Discharge Summary (Unsigned)
DATE OF BIRTH:                        09/30/1970            ADMISSION DATE:                     04/09/2006      DISCHARGE DATE:                     04/21/2006            ATTENDING PHYSICIAN:                  Zackery Barefoot, MD            ADMISSION DIAGNOSIS:      1.   Small bowel obstruction.      2.   History of total abdominal hysterectomy and right      salpingo-oophorectomy recently.            DISCHARGE DIAGNOSIS:      1.   Resolved small bowel obstruction.      2.   History of total abdominal hysterectomy and right      salpingo-oophorectomy recently.      3.   Pelvic abscess, clinically improving, status post interventional      radiology drainage of abscess.      4.   Blood transfusion.            HOSPITAL COURSE:   The patient was admitted on 04/09/2006 complaining of      fever, abdominal distention, and abdominal pain.  While in house she had a      CT scan which confirmed the diagnosis of small bowel obstruction as well as      two fluid collections suggestive of pelvic abscess measuring approximately      5 cm apiece.            Concerns:      1.   Small bowel obstruction.  For nutrition the patient was started on      total parenteral nutrition via a PICC line on 04/12/2006 as we awaited      return of her bowel function.  She initially remained n.p.o. and when the      bowel sounds were audible, distention improved, and flatus obtained, her      nasogastric tube was removed and eventually her total parenteral nutrition      was discontinued and diet was slowly advanced.  By the time of discharge      she was able to tolerate a regular diet without difficulty.            2.   Pelvic abscess.  The patient underwent an interventional radiology      drainage of the pelvic abscess on 04/10/2006 without difficulty.  Initially      she continued to spike temperatures up to 101 despite being on Levaquin,      Flagyl, and subsequently broadened to vancomycin.  She continued to spike      fevers throughout  04/12/2006, and the vancomycin was subsequently switched      to ertapenem which she was continued on throughout the remainder of her      hospital stay.  She soon defervesced and her white count continued to      decline.  At the time of discharge it was normal at 7.1.  She  had two prior      drains in place which at the time of discharge were putting out between 40      and 50 mL of serosanguineous fluid and were, therefore, left in place with      a home health consultation to help manage outpatient care of her      interventional radiology drains.  Of note, her wound cultures from the      drainage site grew out bacteroides species on two separate cultures.  At      the time of discharge the patient received a final dose of ertapenem IV      while in house and was discharged to home on Levaquin and Flagyl p.o.            3.   Anemia.  The patient's hematocrit fell to as low as 22 on 04/13/2006      and she received two units of packed red blood cells, responded      appropriately, and at the time of discharge had a hematocrit of 30 and a      hemoglobin of 9.9.  Vital signs were stable.            CONDITION ON DISCHARGE:  The patient was discharged to home in stable      condition.  By the time of discharge she was afebrile, had no subjective      fevers, was ambulatory, tolerating a regular diet, voiding spontaneously,      and desired to go home.            DISCHARGE MEDICATIONS:  She was discharged to home on the following      medications.  Levaquin 750 mg one tablet p.o. daily, Flagyl 500 mg one      tablet p.o. q.12 h., colace as needed for constipation, Mylanta one to two      teaspoons every four hours as needed for nausea or upset stomach.            DISCHARGE INSTRUCTIONS:  She was to follow up with infectious disease,      phone number [phone], this week, and to follow up with Dr. Maple Hudson      within the next week as well.            Precautions were given to the patient.  All questions were  answered.  The      patient was discharged to home in stable condition.                                          ___________________________________     Date Signed: __________      Zackery Barefoot, MD  ([id number])            D: 04/24/2006 by Roel Cluck, MD      T: 04/24/2006 by [id number] ([id number]) Dorris Carnes: [id number])      cc:  Zackery Barefoot, MD

## 2010-12-05 NOTE — Consults (Unsigned)
DATE OF BIRTH:                        [birth year]      ADMISSION DATE:                     04/09/2006            PATIENT LOCATION:                    [facility] [id number]            DATE OF CONSULTATION:                04/10/2006      CONSULTANT:                        Debroah Baller, MD      CONSULTING SERVICE:                  INFECTIOUS DISEASE            ATTENDING PHYSICIAN:                     Idelia Salm, MD            REASON FOR CONSULTATION:   Antibiotic advice.            HISTORY OF PRESENT ILLNESS:    Ms. Barbary is a 40 year old woman who is      postop day 5 for a total abdominal hysterectomy, with resection of her      right ovary for fibroids and endometriosis with dense adhesions in her      pelvis. She has her left remaining ovary. She was seen at [facility] and was noted to have a high-grade distal small bowel obstruction      on CT, and was transferred over here for admission and care today. She also      has been noted to have some fever. She had a repeat CT today which shows      that she has a decompressed small bowel with fluid in the colon and has two      collections, one that is 5 x 5.5 x 5.5 cm in the left side of the bladder      and 3 x 4.8 cm around the left side of the vaginal cuff. She was started on      Levaquin 500 mg IV daily and Flagyl 500 mg IV q.12. No long lines in place.                  Her T. max. since she has been here has been 101.1. Currently she is 96.1,      pulse 124, respirations 18, blood pressure is 142/71. She is moaning and      groaning. Her lungs are clear on the left. Decreased breath sounds in the      right base. Mouth shows a little bit of possible thrush, it is light. There      is no adenopathy in the neck. Heart sounds are an S1, S2, and heard      normally. Abdomen is quiet and obese, with diffuse tenderness, no erythema.      Staples are in place. She has a positive NG and her extremities are without      edema.  White count  is down to 10.5, hematocrit 26.6, platelets 328, blood sugar is      96. BUN 20, creatinine 1.1. Electrolytes 141, 3.3, 100 and 31. Her micro      results are negative. CT is noted above.            IMPRESSION:   Small bowel obstruction, rule out peritonitis, abscess.            Would cover broadly, add vancomycin 1 gram IV q.12, increase Flagyl to 500      IV q.8, Levaquin 750 IV q daily. Await culture results. Will check LFTs,      amylase, and lipase. Case discussed with the patient and father.            Thank you for the consultation.                        ___________________________________          Date Signed: __________      Debroah Baller, MD  ([id number])            D: 04/10/2006 by Debroah Baller, MD      T: 04/10/2006 by [facility] ([id number]) (N: [id number])      cc:  Debroah Baller, MD          Idelia Salm, MD

## 2012-05-20 ENCOUNTER — Ambulatory Visit: Payer: Commercial Managed Care - POS | Admitting: Obstetrics and Gynecology

## 2012-05-20 ENCOUNTER — Encounter: Payer: Self-pay | Admitting: Obstetrics and Gynecology

## 2012-05-20 VITALS — BP 141/92 | HR 86 | Ht 69.0 in | Wt 294.0 lb

## 2012-05-20 DIAGNOSIS — N390 Urinary tract infection, site not specified: Secondary | ICD-10-CM

## 2012-05-20 DIAGNOSIS — Z01419 Encounter for gynecological examination (general) (routine) without abnormal findings: Secondary | ICD-10-CM

## 2012-05-20 DIAGNOSIS — Z1212 Encounter for screening for malignant neoplasm of rectum: Secondary | ICD-10-CM

## 2012-05-20 NOTE — Progress Notes (Signed)
Subjective:      Leslie [name] is a 42 y.o. G53P3002 female here for a routine exam.  Current complaints: occasional pelvic pressure symptoms over the past couple months.   The patient is sexually active.   The patient has regular exercise: yes. She denies development of new medical concerns over the past year.    She had a TAH/RSO with rectosigmoid enterotomy with readmission with pelvic abscesses on POD #3 and eventual IR drainage with long term TPN and antibiotic therapy.    Gynecologic History  No LMP recorded. Patient has had a hysterectomy.  Contraception: status post hysterectomy    Common GYN tests  Last Pap Date: 02/17/06  Last Pap Result: Normal    The following portions of the patient's history were reviewed and updated as appropriate: allergies, current medications, past family history, past medical history, past social history, past surgical history and problem list.    Review of Systems  Pertinent items are noted in HPI.     Objective:   BP 141/92  Pulse 86  Ht 1.753 m (5\' 9" )  Wt 294 lb  BMI 43.40 kg/m2  General appearance: alert, appears stated age and cooperative  Neck: no adenopathy, supple, symmetrical, trachea midline and thyroid not enlarged, symmetric, no tenderness/mass/nodules  Lungs: clear to auscultation bilaterally  Breasts: normal appearance, no masses or tenderness, No nipple retraction or dimpling  Heart: regular rate and rhythm  Abdomen: soft, non-tender; bowel sounds normal; no masses,  no organomegaly  Extremities: extremities normal, atraumatic, no cyanosis or edema  Pelvic Exam:   Vulva:  normal   Vagina: normal vagina, no discharge, exudate, lesion, or erythema, trace bladder tenderness.   Cervix:  no cervical motion tenderness and no lesions   Corpus: normal size, contour, position, consistency, mobility, non-tender   Adnexa:  no mass, fullness, tenderness     Assessment:     Healthy female exam.    Plan:     UA, C&S  Pap done  Mammogram ordered  O-C Lite Kit given to  patient  Education reviewed: self breast exams.  Follow up in: 1 year

## 2012-05-20 NOTE — Addendum Note (Signed)
Addended by: Cherlyn Cushing on: 05/20/2012 02:39 PM     Modules accepted: Orders

## 2012-05-21 LAB — URINE MICROSCOPIC
Hyaline Casts, UA: NONE SEEN
Squam Epithel, UA: NONE SEEN (ref 0–5)
Urine Bacteria: NONE SEEN
WBC: NONE SEEN (ref 0–5)

## 2012-05-21 LAB — URINALYSIS
Bilirubin, UA: NEGATIVE
Blood, UA: NEGATIVE
Glucose Qualitative: NEGATIVE
Ketones UA: NEGATIVE
Leukocyte Esterase, UA: NEGATIVE
NITRITE: NEGATIVE
Specific Gravity, UA: 1.026 (ref 1.001–1.035)
pH: 6 (ref 5.0–8.0)

## 2012-05-22 LAB — URINE CULTURE

## 2012-05-24 LAB — THINPREP IMAGING PAP WITH REFLEX TO HPV MRNA E6/E7.

## 2012-07-09 ENCOUNTER — Encounter (INDEPENDENT_AMBULATORY_CARE_PROVIDER_SITE_OTHER): Payer: Self-pay | Admitting: Student in an Organized Health Care Education/Training Program

## 2013-07-04 ENCOUNTER — Encounter: Payer: Self-pay | Admitting: Obstetrics and Gynecology

## 2015-03-19 ENCOUNTER — Ambulatory Visit
Admission: RE | Admit: 2015-03-19 | Discharge: 2015-03-19 | Disposition: A | Discharge status: Home / Self Care | Payer: 59 | Source: Ambulatory Visit | Attending: Family Medicine | Admitting: Family Medicine

## 2015-03-19 ENCOUNTER — Other Ambulatory Visit: Payer: Self-pay | Admitting: Family Medicine

## 2015-03-19 DIAGNOSIS — M5416 Radiculopathy, lumbar region: Secondary | ICD-10-CM | POA: Insufficient documentation

## 2015-05-04 HISTORY — PX: OTHER SURGICAL HISTORY: SHX169

## 2015-11-12 ENCOUNTER — Emergency Department
Admission: EM | Admit: 2015-11-12 | Discharge: 2015-11-12 | Disposition: A | Discharge status: Home / Self Care | Payer: No Typology Code available for payment source | Attending: Emergency Medical Services | Admitting: Emergency Medical Services

## 2015-11-12 ENCOUNTER — Emergency Department: Payer: No Typology Code available for payment source

## 2015-11-12 DIAGNOSIS — Z90710 Acquired absence of both cervix and uterus: Secondary | ICD-10-CM | POA: Insufficient documentation

## 2015-11-12 DIAGNOSIS — Z8742 Personal history of other diseases of the female genital tract: Secondary | ICD-10-CM | POA: Insufficient documentation

## 2015-11-12 DIAGNOSIS — D72829 Elevated white blood cell count, unspecified: Secondary | ICD-10-CM | POA: Insufficient documentation

## 2015-11-12 DIAGNOSIS — R1032 Left lower quadrant pain: Secondary | ICD-10-CM | POA: Insufficient documentation

## 2015-11-12 DIAGNOSIS — Z87440 Personal history of urinary (tract) infections: Secondary | ICD-10-CM | POA: Insufficient documentation

## 2015-11-12 LAB — CBC AND DIFFERENTIAL
Absolute NRBC: 0 10*3/uL
Basophils Absolute Automated: 0.04 10*3/uL (ref 0.00–0.20)
Basophils Automated: 0.3 %
Eosinophils Absolute Automated: 0.05 10*3/uL (ref 0.00–0.70)
Eosinophils Automated: 0.4 %
Hematocrit: 40.7 % (ref 37.0–47.0)
Hgb: 13.6 g/dL (ref 12.0–16.0)
Immature Granulocytes Absolute: 0.04 10*3/uL
Immature Granulocytes: 0.3 %
Lymphocytes Absolute Automated: 2.24 10*3/uL (ref 0.50–4.40)
Lymphocytes Automated: 17.5 %
MCH: 28.6 pg (ref 28.0–32.0)
MCHC: 33.4 g/dL (ref 32.0–36.0)
MCV: 85.5 fL (ref 80.0–100.0)
MPV: 11.2 fL (ref 9.4–12.3)
Monocytes Absolute Automated: 0.79 10*3/uL (ref 0.00–1.20)
Monocytes: 6.2 %
Neutrophils Absolute: 9.61 10*3/uL — ABNORMAL HIGH (ref 1.80–8.10)
Neutrophils: 75.3 %
Nucleated RBC: 0 /100 WBC (ref 0.0–1.0)
Platelets: 172 10*3/uL (ref 140–400)
RBC: 4.76 10*6/uL (ref 4.20–5.40)
RDW: 14 % (ref 12–15)
WBC: 12.77 10*3/uL — ABNORMAL HIGH (ref 3.50–10.80)

## 2015-11-12 LAB — URINALYSIS, REFLEX TO MICROSCOPIC EXAM IF INDICATED
Bilirubin, UA: NEGATIVE
Blood, UA: NEGATIVE
Glucose, UA: NEGATIVE
Ketones UA: NEGATIVE
Leukocyte Esterase, UA: NEGATIVE
Nitrite, UA: POSITIVE — AB
Protein, UR: NEGATIVE
Specific Gravity UA: 1.009 (ref 1.001–1.035)
Urine pH: 6 (ref 5.0–8.0)
Urobilinogen, UA: 4 mg/dL — AB

## 2015-11-12 LAB — HEPATIC FUNCTION PANEL
ALT: 11 U/L (ref 0–55)
AST (SGOT): 15 U/L (ref 5–34)
Albumin/Globulin Ratio: 0.9 (ref 0.9–2.2)
Albumin: 3.6 g/dL (ref 3.5–5.0)
Alkaline Phosphatase: 101 U/L (ref 37–106)
Bilirubin Direct: 0.2 mg/dL (ref 0.0–0.5)
Bilirubin Indirect: 0.4 mg/dL (ref 0.0–1.1)
Bilirubin, Total: 0.6 mg/dL (ref 0.2–1.2)
Globulin: 4 g/dL — ABNORMAL HIGH (ref 2.0–3.6)
Protein, Total: 7.6 g/dL (ref 6.0–8.3)

## 2015-11-12 LAB — BASIC METABOLIC PANEL
Anion Gap: 9 (ref 5.0–15.0)
BUN: 11 mg/dL (ref 7–19)
CO2: 23 mEq/L (ref 22–29)
Calcium: 9 mg/dL (ref 8.5–10.5)
Chloride: 106 mEq/L (ref 100–111)
Creatinine: 1.1 mg/dL — ABNORMAL HIGH (ref 0.6–1.0)
Glucose: 83 mg/dL (ref 70–100)
Potassium: 4.1 mEq/L (ref 3.5–5.1)
Sodium: 138 mEq/L (ref 136–145)

## 2015-11-12 LAB — GFR: EGFR: >60

## 2015-11-12 LAB — LIPASE: Lipase: 25 U/L (ref 8–78)

## 2015-11-12 MED ORDER — LEVOFLOXACIN 500 MG PO TABS
500.0000 mg | ORAL_TABLET | Freq: Every day | ORAL | 0 refills | Status: AC
Start: 2015-11-12 — End: 2015-11-22

## 2015-11-12 MED ORDER — ONDANSETRON HCL 4 MG/2ML IJ SOLN
4.0000 mg | Freq: Once | INTRAMUSCULAR | Status: DC
Start: 2015-11-12 — End: 2015-11-12
  Filled 2015-11-12: qty 2

## 2015-11-12 MED ORDER — HYDROCODONE-ACETAMINOPHEN 5-325 MG PO TABS
1.0000 | ORAL_TABLET | Freq: Four times a day (QID) | ORAL | 0 refills | Status: DC | PRN
Start: 2015-11-12 — End: 2017-06-04

## 2015-11-12 MED ORDER — SODIUM CHLORIDE 0.9 % IV BOLUS
1000.0000 mL | Freq: Once | INTRAVENOUS | Status: AC
Start: 2015-11-12 — End: 2015-11-12
  Administered 2015-11-12: 1000 mL via INTRAVENOUS

## 2015-11-12 MED ORDER — MORPHINE SULFATE 4 MG/ML IJ/IV SOLN (WRAP)
4.0000 mg | Freq: Once | Status: DC
Start: 2015-11-12 — End: 2015-11-12
  Filled 2015-11-12: qty 1

## 2015-11-12 MED ORDER — ACETAMINOPHEN 325 MG PO TABS
650.0000 mg | ORAL_TABLET | Freq: Once | ORAL | Status: DC
Start: 2015-11-12 — End: 2015-11-12
  Filled 2015-11-12: qty 2

## 2015-11-12 MED ORDER — HYDROCODONE-ACETAMINOPHEN 5-325 MG PO TABS
1.0000 | ORAL_TABLET | Freq: Once | ORAL | Status: AC
Start: 2015-11-12 — End: 2015-11-12
  Administered 2015-11-12: 1 via ORAL
  Filled 2015-11-12: qty 1

## 2015-11-12 MED ORDER — IOHEXOL 240 MG/ML IJ SOLN
50.0000 mL | Freq: Once | INTRAMUSCULAR | Status: AC
Start: 2015-11-12 — End: 2015-11-12
  Administered 2015-11-12: 50 mL via ORAL

## 2015-11-12 MED ORDER — ONDANSETRON 4 MG PO TBDP
4.0000 mg | ORAL_TABLET | Freq: Four times a day (QID) | ORAL | 0 refills | Status: DC | PRN
Start: 2015-11-12 — End: 2017-11-03

## 2015-11-12 NOTE — ED Provider Notes (Addendum)
Physician/Midlevel provider first contact with patient: 11/12/15 1141         EMERGENCY DEPARTMENT HISTORY AND PHYSICAL EXAM      Patient Information     Patient Name: Leslie [name], Leslie [name]  Encounter Date:  11/12/2015  Patient DOB:  1970/10/27  MRN:  [id number]  Room:  14/B14  Rendering Provider: Sherri Sear, PA-C    History of Presenting Illness     Chief Complaint: LLQ pain  Historian: pt  Onset: sudden  Quality: aching  Location: LLQ    Duration: 1 week      HPI Comments:   45 y.o. female with hx of fibroids p/w worsening LLQ pain x 1 week. Associated with dysuria, although pt states "urinating just makes the pain worse, it doesn't hurt to pee." Pt began taking OTC cranberry tablets without relief. Surgical hx significant for prior total hysterectomy. Associated with nausea and the sensation of constipation, although she had a small BM this morning. No changes in diet or travel. No recent abx. No hx of frequent UTIs.       PMD: Satira Mccallum, MD    Past Medical History     Past Medical History:   Diagnosis Date   . Fibroid    . Herpes simplex without mention of complication 2003    possible recurrance in 2013   . Urinary tract infection        Past Surgical History     Past Surgical History:   Procedure Laterality Date   . HYSTERECTOMY  2008    fibroids, readmitted for pelvic abscess formation POD#3   . OTHER SURGICAL HISTORY      [place] balloon   . SALPINGOOPHORECTOMY  2008    right   . TUBAL LIGATION  1999       Family History     Family History   Problem Relation Age of Onset   . Hypertension Mother    . Coronary artery disease Mother    . Hypertension Father    . Diabetes Father        Social History     Social History     Social History   . Marital status: Legally Separated     Spouse name: N/A   . Number of children: N/A   . Years of education: N/A     Social History Main Topics   . Smoking status: Never Smoker   . Smokeless tobacco: Never Used   . Alcohol use Yes      Comment: wine - social   . Drug use: No   .  Sexual activity: Yes     Partners: Male     Other Topics Concern   . Not on file     Social History Narrative   . No narrative on file       Allergies     Allergies   Allergen Reactions   . Penicillins Rash       Home Medications     Prior to Admission medications    Medication Sig Start Date End Date Taking? Authorizing Provider   Ondansetron HCl (ZOFRAN PO) Take by mouth.   Yes [provider]         Review of Systems     Review of Systems   Constitutional: Negative for fever and chills.   HENT: Negative for sore throat.   Cardiovascular: Negative for chest pain.   Respiratory: Negative for cough and shortness of breath.   Gastrointestinal: + abdominal pain,  nausea. Negative for vomiting, diarrhea.  Genitourinary: + dysuria.   Musculoskeletal: No back pain.  Skin: Negative for rash.   Neuro: Negative for syncope, headache.   All other systems reviewed and are negative.      Physical Exam     Patient Vitals for the past 24 hrs:   BP Temp Temp src Pulse Resp SpO2 Height Weight   11/12/15 1651 - - - 92 - 98 % - -   11/12/15 1610 159/86 - - 88 15 98 % - -   11/12/15 1600 - - - 87 15 98 % - -   11/12/15 1330 (!) 144/109 - - 92 - 99 % - -   11/12/15 1117 (!) 178/103 98.2 F (36.8 C) Oral 85 20 99 % 5\' 9"  (1.753 m) 128.8 kg       Physical Exam   Nursing note and vitals reviewed.   Constitutional: Pt is oriented to person, place, and time. Pt appears well-developed and well-nourished. Obese, NAD.   HENT:     Head: Normocephalic and atraumatic.   Eyes: Conjunctivae normal and EOM are normal.   Neck: Normal range of motion.   Cardiovascular: Intact distal pulses.   Pulmonary/Chest: No respiratory distress. O2 sat 99% on RA. CTAB  Musculoskeletal: Normal ROM of extremities.   Abdomen: Normal BS. No audible bruits.LLQ abdominal tenderness with guarding. Mild suprapubic tenderness. Large body habitus but no hernias appreciated.  Neurological: Pt is alert and oriented to person, place, and time. GCS 15.   Skin: Skin  is warm and dry.   Psychiatric: Normal mood and affect. Behavior is normal.       Orders Placed During This Encounter     Orders Placed This Encounter   Procedures   . CT Abd/Pelvis with PO Contrast   . Basic Metabolic Panel (BMP)   . CBC with differential   . Lipase   . Hepatic function panel (LFTs)   . UA with reflex to micro (all hospital ED's and [facility],  pts 3 + yrs)   . GFR       ED Medications Administered     ED Medication Orders     Start Ordered     Status Ordering Provider    11/12/15 1725 11/12/15 1724    Once     Route: Oral  Ordered Dose: 650 mg     Discontinued CHU, JOSEPH L    11/12/15 1725 11/12/15 1724  HYDROcodone-acetaminophen (NORCO) 5-325 MG per tablet 1 tablet  Once     Route: Oral  Ordered Dose: 1 tablet     Last MAR action:  Given RHEE, JAMES    11/12/15 1249 11/12/15 1248  morphine injection 4 mg  Once     Route: Intravenous  Ordered Dose: 4 mg     Last MAR action:  Hold Kaitlan Bin H    11/12/15 1249 11/12/15 1248  ondansetron (ZOFRAN) injection 4 mg  Once     Route: Intravenous  Ordered Dose: 4 mg     Last MAR action:  Hold Abdimalik Mayorquin H    11/12/15 1155 11/12/15 1154  sodium chloride 0.9 % bolus 1,000 mL  Once     Route: Intravenous  Ordered Dose: 1,000 mL     Last MAR action:  Stopped Denelle Capurro H    11/12/15 1155 11/12/15 1154  iohexol (OMNIPAQUE) 240 MG/ML solution 50 mL  Once     Route: Oral  Ordered Dose: 50 mL  Last MAR action:  Imaging Agent Given Sheppard Evens          Diagnostic Study Results and Data Review     The results of the diagnostic studies below were reviewed by the ED provider:    Labs  Results     Procedure Component Value Units Date/Time    CBC with differential [[id number]]  (Abnormal) Collected:  11/12/15 1401    Specimen:  Blood from Blood Updated:  11/12/15 1433     WBC 12.77 (H) x10 3/uL      Hgb 13.6 g/dL      Hematocrit 11.9 %      Platelets 172 x10 3/uL      RBC 4.76 x10 6/uL      MCV 85.5 fL      MCH 28.6 pg      MCHC 33.4  g/dL      RDW 14 %      MPV 11.2 fL      Neutrophils 75.3 %      Lymphocytes Automated 17.5 %      Monocytes 6.2 %      Eosinophils Automated 0.4 %      Basophils Automated 0.3 %      Immature Granulocyte 0.3 %      Nucleated RBC 0.0 /100 WBC      Neutrophils Absolute 9.61 (H) x10 3/uL      Abs Lymph Automated 2.24 x10 3/uL      Abs Mono Automated 0.79 x10 3/uL      Abs Eos Automated 0.05 x10 3/uL      Absolute Baso Automated 0.04 x10 3/uL      Absolute Immature Granulocyte 0.04 x10 3/uL      Absolute NRBC 0.00 x10 3/uL     Basic Metabolic Panel (BMP) [[id number]]  (Abnormal) Collected:  11/12/15 1401    Specimen:  Blood Updated:  11/12/15 1430     Glucose 83 mg/dL      BUN 11 mg/dL      Creatinine 1.1 (H) mg/dL      Calcium 9.0 mg/dL      Sodium 130 mEq/L      Potassium 4.1 mEq/L      Chloride 106 mEq/L      CO2 23 mEq/L      Anion Gap 9.0    Lipase [[id number]] Collected:  11/12/15 1401    Specimen:  Blood Updated:  11/12/15 1430     Lipase 25 U/L     Hepatic function panel (LFTs) [[id number]]  (Abnormal) Collected:  11/12/15 1401    Specimen:  Blood Updated:  11/12/15 1430     Bilirubin, Total 0.6 mg/dL      Bilirubin, Direct 0.2 mg/dL      Bilirubin, Indirect 0.4 mg/dL      AST (SGOT) 15 U/L      ALT 11 U/L      Alkaline Phosphatase 101 U/L      Protein, Total 7.6 g/dL      Albumin 3.6 g/dL      Globulin 4.0 (H) g/dL      Albumin/Globulin Ratio 0.9    GFR [[id number]] Collected:  11/12/15 1401     Updated:  11/12/15 1430     EGFR >60.0    UA with reflex to micro (all hospital ED's and [facility],  pts 3 + yrs) [[id number]]  (Abnormal) Collected:  11/12/15 1326    Specimen:  Urine Updated:  11/12/15 1356  Urine Type Clean Catch     Color, UA Amber (A)     Clarity, UA Clear     Specific Gravity UA 1.009     Urine pH 6.0     Leukocyte Esterase, UA Negative     Nitrite, UA Positive (A)     Protein, UR Negative     Glucose, UA Negative     Ketones UA Negative     Urobilinogen, UA 4.0 (A) mg/dL       Bilirubin, UA Negative     Blood, UA Negative     RBC, UA 0 - 2 /hpf      WBC, UA 0 - 5 /hpf      Squamous Epithelial Cells, Urine 0 - 5 /hpf      Urine Mucus Present          Radiologic Studies  Radiology Results (24 Hour)     Procedure Component Value Units Date/Time    CT Abd/Pelvis with PO Contrast [[id number]] Collected:  11/12/15 1539    Order Status:  Completed Updated:  11/12/15 1548    Narrative:       Indication: Left lower quadrant pain.    Procedure: Unenhanced CT of the abdomen and pelvis. No IV contrast  material was administered. Oral contrast material is present.    Comparison: Prior exam March 2008.    Findings: There is a left pelvic fluid collection measuring 6.5 x 4.1 cm  with attenuation measuring 30 Hounsfield units. A smaller collection was  present at the same site on previous CT in 2008. The uterus is absent.  No thickening of the adjacent colonic wall. No colonic diverticula are  identified. The appendix is seen extending medially from the cecal tip.  No evidence of appendicitis. No dilated bowel loops are seen. No free  intraperitoneal gas. Sensitivity for some abnormalities is limited  without IV contrast material. No urinary tract calculi are seen. No  hydronephrosis. No renal masses or cysts. Unremarkable gallbladder. No  dilatation of the biliary tree. No focal liver lesions are seen.  Unremarkable spleen, pancreas, adrenals, and abdominal aorta.  Degenerative changes of the lower lumbar facet joints are seen. Normal  cardiac size. Included portions of the lungs are unremarkable.      Impression:         1. No evidence of diverticulitis.  2. There is a left pelvic fluid collection, possibly ovarian in origin  or possibly a postoperative seroma. Correlate for clinical evidence of  infection. Recommend ultrasound or CT follow-up.    Wynema Birch, MD   11/12/2015 3:44 PM              Abnormal results/incidental findings discussed with pt and/or family: yes    Monitors, EKG, Procedures,  Critical Care, and Splints     Procedure Note: US Guided Peripheral IV Line Placement.     Performed by: Sherri Sear PA-C   Consent: Verbal consent obtained.  Risks and benefits: risks, benefits and alternatives were discussed  Site marked: the operative site was marked  Patient identity confirmed: verbally with patient and arm band  Time out: Immediately prior to procedure a "time out" was called to verify the correct patient, procedure, equipment, support staff and site/side marked as required.  Preparation: skin prepped with 2% chlorhexidine  Skin prep agent dried: skin prep agent completely dried prior to procedure  Hand hygiene: hand hygiene performed prior to central venous catheter insertion  Standard sterile prep followed.  Pre-procedure:  landmarks identified  Ultrasound guidance: yes, direct sterile u/s guidance  Comments: Dark dribbling blood withdrawn. Not pulsatile or bright red.  Successful placement: yes    Anesthesia:        none  Location:            R AC vein  Catheter type:    20g  # of attempts:    1    Post-procedure: line secured  Dressing applied  Patient tolerated the procedure well with no immediate complications.        MDM and Clinical Notes     Working Differential (not completely inclusive): diverticulitis, constipation, UTI, pyelo, kidney stone , etc    Nursing records reviewed and agree: Yes    Clinical Notes & Decision Making:   45 yo female with LLQ pain x 1 week with worsening pain with urination. However, on examination pt was hyertensive, tachycardic, and very uncomfortable. IV access difficult to obtain, multiple attempts in the ED. Placed R AC IV to obtain labs, but unable to use for IV contrast, will order CT PO contrast only.       Signout given to Nettie Elm pending CT.     ED Course        Re-Eval: Pt feeling improved. Tolerated PO contrast with no vomiting.       Prescriptions       New Prescriptions    HYDROCODONE-ACETAMINOPHEN (NORCO) 5-325 MG PER TABLET    Take 1  tablet by mouth every 6 (six) hours as needed for Pain.for up to 15 doses    LEVOFLOXACIN (LEVAQUIN) 500 MG TABLET    Take 1 tablet (500 mg total) by mouth daily.for 10 days    ONDANSETRON (ZOFRAN-ODT) 4 MG DISINTEGRATING TABLET    Take 1 tablet (4 mg total) by mouth every 6 (six) hours as needed for Nausea.       Diagnosis and Disposition     Clinical Impression:  1. Left lower quadrant pain      Final diagnoses:   Left lower quadrant pain       Disposition:  ED Disposition     ED Disposition Condition Date/Time Comment    Discharge  Mon Nov 12, 2015  4:52 PM [name] discharge to home/self care.    Condition at disposition: Stable                Rendering Provider: Sherri Sear, PA-C    Attending's signature signifies review of the provider note and clinical impression.    This note was generated by the [facility] EMR system/Dragon speech recognition and may contain inherent errors or omissions not intended by the user. Grammatical errors, random word insertions, deletions, pronoun errors and incomplete sentences are occasional consequences of this technology due to software limitations. Not all errors are caught or corrected. If there are questions or concerns about the content of this note or information contained within the body of this dictation they should be addressed directly with the author for clarification.       Sheppard Evens, [place]  11/12/15 1532       Sheppard Evens, [place]  11/12/15 [phone]

## 2015-11-12 NOTE — ED Provider Notes (Signed)
ED Signout     Patient received from: Carolina Cellar PA-Cat [id number]    Signout notes: Pending CT abd pelvis. Has pain in LLQ, labs showed mild white count. Nitrites in UA. If CT neg, then can go home.       Diagnostic Study Results     The results of the diagnostic studies below were reviewed by the ED provider:    Labs  Results     Procedure Component Value Units Date/Time    CBC with differential [[id number]]  (Abnormal) Collected:  11/12/15 1401    Specimen:  Blood from Blood Updated:  11/12/15 1433     WBC 12.77 (H) x10 3/uL      Hgb 13.6 g/dL      Hematocrit 14.7 %      Platelets 172 x10 3/uL      RBC 4.76 x10 6/uL      MCV 85.5 fL      MCH 28.6 pg      MCHC 33.4 g/dL      RDW 14 %      MPV 11.2 fL      Neutrophils 75.3 %      Lymphocytes Automated 17.5 %      Monocytes 6.2 %      Eosinophils Automated 0.4 %      Basophils Automated 0.3 %      Immature Granulocyte 0.3 %      Nucleated RBC 0.0 /100 WBC      Neutrophils Absolute 9.61 (H) x10 3/uL      Abs Lymph Automated 2.24 x10 3/uL      Abs Mono Automated 0.79 x10 3/uL      Abs Eos Automated 0.05 x10 3/uL      Absolute Baso Automated 0.04 x10 3/uL      Absolute Immature Granulocyte 0.04 x10 3/uL      Absolute NRBC 0.00 x10 3/uL     Basic Metabolic Panel (BMP) [[id number]]  (Abnormal) Collected:  11/12/15 1401    Specimen:  Blood Updated:  11/12/15 1430     Glucose 83 mg/dL      BUN 11 mg/dL      Creatinine 1.1 (H) mg/dL      Calcium 9.0 mg/dL      Sodium 865 mEq/L      Potassium 4.1 mEq/L      Chloride 106 mEq/L      CO2 23 mEq/L      Anion Gap 9.0    Lipase [[id number]] Collected:  11/12/15 1401    Specimen:  Blood Updated:  11/12/15 1430     Lipase 25 U/L     Hepatic function panel (LFTs) [[id number]]  (Abnormal) Collected:  11/12/15 1401    Specimen:  Blood Updated:  11/12/15 1430     Bilirubin, Total 0.6 mg/dL      Bilirubin, Direct 0.2 mg/dL      Bilirubin, Indirect 0.4 mg/dL      AST (SGOT) 15 U/L      ALT 11 U/L      Alkaline Phosphatase 101 U/L      Protein,  Total 7.6 g/dL      Albumin 3.6 g/dL      Globulin 4.0 (H) g/dL      Albumin/Globulin Ratio 0.9    GFR [[id number]] Collected:  11/12/15 1401     Updated:  11/12/15 1430     EGFR >60.0    UA with reflex to micro (all hospital ED's and [facility],  pts 3 + yrs) [[id number]]  (Abnormal) Collected:  11/12/15 1326    Specimen:  Urine Updated:  11/12/15 1356     Urine Type Clean Catch     Color, UA Amber (A)     Clarity, UA Clear     Specific Gravity UA 1.009     Urine pH 6.0     Leukocyte Esterase, UA Negative     Nitrite, UA Positive (A)     Protein, UR Negative     Glucose, UA Negative     Ketones UA Negative     Urobilinogen, UA 4.0 (A) mg/dL      Bilirubin, UA Negative     Blood, UA Negative     RBC, UA 0 - 2 /hpf      WBC, UA 0 - 5 /hpf      Squamous Epithelial Cells, Urine 0 - 5 /hpf      Urine Mucus Present          Radiologic Studies  Radiology Results (24 Hour)     Procedure Component Value Units Date/Time    CT Abd/Pelvis with PO Contrast [[id number]] Collected:  11/12/15 1539    Order Status:  Completed Updated:  11/12/15 1548    Narrative:       Indication: Left lower quadrant pain.    Procedure: Unenhanced CT of the abdomen and pelvis. No IV contrast  material was administered. Oral contrast material is present.    Comparison: Prior exam March 2008.    Findings: There is a left pelvic fluid collection measuring 6.5 x 4.1 cm  with attenuation measuring 30 Hounsfield units. A smaller collection was  present at the same site on previous CT in 2008. The uterus is absent.  No thickening of the adjacent colonic wall. No colonic diverticula are  identified. The appendix is seen extending medially from the cecal tip.  No evidence of appendicitis. No dilated bowel loops are seen. No free  intraperitoneal gas. Sensitivity for some abnormalities is limited  without IV contrast material. No urinary tract calculi are seen. No  hydronephrosis. No renal masses or cysts. Unremarkable gallbladder. No  dilatation of  the biliary tree. No focal liver lesions are seen.  Unremarkable spleen, pancreas, adrenals, and abdominal aorta.  Degenerative changes of the lower lumbar facet joints are seen. Normal  cardiac size. Included portions of the lungs are unremarkable.      Impression:         1. No evidence of diverticulitis.  2. There is a left pelvic fluid collection, possibly ovarian in origin  or possibly a postoperative seroma. Correlate for clinical evidence of  infection. Recommend ultrasound or CT follow-up.    Wynema Birch, MD   11/12/2015 3:44 PM            Vital Signs  BP 159/86   Pulse 92   Temp 98.2 F (36.8 C) (Oral)   Resp 15   Ht 5\' 9"  (1.753 m)   Wt 128.8 kg   SpO2 98%   BMI 41.94 kg/m   Patient Vitals for the past 24 hrs:   BP Pulse Resp SpO2   11/12/15 1651 - 92 - 98 %   11/12/15 1610 159/86 88 15 98 %   11/12/15 1600 - 87 15 98 %             Clinical Course / MDM     Notes:   Pt here for eval of LLQ abd pain, labs show  white count elevation, pending CT abd pelvis. Will cont to monitor.   CT shows no acute concerns, noted free fluidfluidan abdomen which was discussed with the patient  Advised on pain control, close follow-up monitor symptoms and return for further concerns.        Clinical Impression:  1. Left lower quadrant pain        ED Disposition     ED Disposition Condition Date/Time Comment    Discharge  Mon Nov 12, 2015  4:52 PM [name] discharge to home/self care.    Condition at disposition: Stable             Nettie Elm, [place]  11/13/15 1336

## 2015-11-12 NOTE — ED Triage Notes (Signed)
Pt. reports worsening LLQ x 1 week. + urinary frequency. + dysuria. Denies N/V. Denies fevers. Pt. A&O, NAD.

## 2015-11-12 NOTE — ED Notes (Signed)
Pt discharged with stated understanding of medications and instructions.  Pt AA&OX4.  Respirations even and unlabored.  Skin, warm, pink and dry.  Pt ambulated to the exit with a steady gait and without difficulty.

## 2015-11-12 NOTE — Discharge Instructions (Signed)
Abdominal Pain     You have been diagnosed with abdominal (belly) pain. The cause of your pain is not yet known.     Many things can cause abdominal pain. Examples include viral infections and bowel (intestine) spasms. You might need another examination or more tests to find out why you have pain.     At this time, your pain does not seem to be caused by anything dangerous. You do not need surgery. You do not need to stay in the hospital.      Though we don’t believe your condition is dangerous right now, it is important to be careful. Sometimes a problem that seems mild can become serious later. This is why it is very important that you return here or go to the nearest Emergency Department unless you are 100% improved.     Return here or go to the nearest Emergency Department, or follow up with your physician in:  · 24 hours.     Drink only clear liquids such as water, clear broth, sports drinks, or clear caffeine-free soft drinks, like 7-Up or Sprite, for the next:  · 24 hours.     YOU SHOULD SEEK MEDICAL ATTENTION IMMEDIATELY, EITHER HERE OR AT THE NEAREST EMERGENCY DEPARTMENT, IF ANY OF THE FOLLOWING OCCURS:  · Your pain does not go away or gets worse.  · You cannot keep fluids down or your vomit is dark green.   · You vomit blood or see blood in your stool. Blood might be bright red or dark red. It can also be black and look like tar.  · You have a fever (temperature higher than 100.4ºF / 38ºC) or shaking chills.  · Your skin or eyes look yellow or your urine looks brown.  · You have severe diarrhea.

## 2015-11-14 ENCOUNTER — Encounter: Payer: Self-pay | Admitting: Obstetrics and Gynecology

## 2017-05-26 ENCOUNTER — Other Ambulatory Visit (INDEPENDENT_AMBULATORY_CARE_PROVIDER_SITE_OTHER): Payer: Self-pay | Admitting: Family Medicine

## 2017-05-26 DIAGNOSIS — I10 Essential (primary) hypertension: Secondary | ICD-10-CM | POA: Insufficient documentation

## 2017-05-26 DIAGNOSIS — E559 Vitamin D deficiency, unspecified: Secondary | ICD-10-CM | POA: Insufficient documentation

## 2017-10-30 ENCOUNTER — Encounter (INDEPENDENT_AMBULATORY_CARE_PROVIDER_SITE_OTHER): Payer: Self-pay

## 2017-10-30 NOTE — Progress Notes (Signed)
:  Room        VASCULAR SURGERY PATIENT FORM       Appt Date/Time:11/03/17 @3 :40pm   PCP: Satira Mccallum, MD  Referring Physician:    New Patient Follow-Up Post-op   [place] [facility] [place] [facility] Other [facility]     Chief Complaint/HPI: Consult LLE swelling. Date of Last Visit: Visit date not found   Allergies   Allergen Reactions   . Penicillins Rash        Selected Medications:    Coumadin Plavix Eliquis Xarelto Aspirin Fish Oil Lovenox   Insulin Metformin Other Diabetes Atorvastatin  Other Cholesterol    MHx:  Stroke/TIA/Seizures Thyroid  Diabetes   Myocardial Infarction Arrhythmia  COPD/Asthma (other lung)   Kidney Disease Liver Disease DVT   Wounds Musculoskeletal (spine) Cancer             Smoker           Former Smoker      x    Never Smoker    Vital Signs this Visit Pulses  HT WT HR   Rad Ulnar Brach Fem Pop DP PT       L          BP L BP R SpO2  R                        Imaging results:           Plan of Care:

## 2017-11-03 ENCOUNTER — Ambulatory Visit (INDEPENDENT_AMBULATORY_CARE_PROVIDER_SITE_OTHER): Payer: Commercial Managed Care - POS | Admitting: Specialist

## 2017-11-03 ENCOUNTER — Encounter (INDEPENDENT_AMBULATORY_CARE_PROVIDER_SITE_OTHER): Payer: Self-pay | Admitting: Specialist

## 2017-11-03 DIAGNOSIS — I890 Lymphedema, not elsewhere classified: Secondary | ICD-10-CM

## 2017-11-03 HISTORY — DX: Lymphedema, not elsewhere classified: I89.0

## 2017-11-03 NOTE — Progress Notes (Signed)
Leslie [name]    Chief Complaint   Patient presents with   . Swelling In Extremities     LLE foot to knee swelling since Mar 2019,  took hctx for 4 mo with no decrease in swelling         History of Present Illness     Leslie [name] is a 47 y.o. female who presents with complaints of left foot swelling for past 20 years.  He states that the swelling has been extended up to the left knee area this has been going on for past few months.  Denies having any history of deep vein thrombosis ischemic rest pain or claudication has not use knee-high compression stockings in the past    Past Medical History     Past Medical History:   Diagnosis Date   . Fibroid    . Herpes simplex without mention of complication 2003    possible recurrance in 2013   . Hypertension    . Lymphedema of extremity 11/03/2017   . Urinary tract infection        Allergies     Allergies   Allergen Reactions   . Penicillins Rash       Medications     Current Outpatient Prescriptions on File Prior to Visit   Medication Sig Dispense Refill   . [DISCONTINUED] ondansetron (ZOFRAN-ODT) 4 MG disintegrating tablet Take 1 tablet (4 mg total) by mouth every 6 (six) hours as needed for Nausea. 8 tablet 0   . [DISCONTINUED] Ondansetron HCl (ZOFRAN PO) Take by mouth.       No current facility-administered medications on file prior to visit.        Review of Systems     Constitutional: Negative for fevers and chills  Skin: No rash or lesions  Respiratory: Negative for cough, wheezing, or hemoptysis  Cardiovascular: as per HPI  Gastrointestinal: Negative for abdominal pain, nausea, vomiting and diarrhea  Musculoskeletal:  No arthritic symptoms  Genitourinary: Negative for dysuria  All other systems were reviewed and are negative except what is stated in the HPI    Physical Exam     Vitals:    11/03/17 1555   BP: (!) 161/99   Pulse:    Temp:    SpO2:        Body mass index is 44.3 kg/m.    General: Patient appears their stated age, well-nourished. Alert  and in no apparent distress.  Lungs: Respiratory effort unlabored, chest expansion symmetric.  Cardiac: RRR, no carotid bruits, no JVD.   Extremities warm, Right Femoral pulses 2+, Left Femoral 2+, Right popliteal 2+, Left popliteal 2+, Right DP 2+, Left DP 2+, Right PT 2+, Left PT 2+,   Abd: Soft, nondistended, nontender. No guarding or rebound, No mid line pulsatile mass   ZOX:WRUE ROM in all 4 extremities, symmetric pitting edema left foot up to the knee area  Skin: Color appropriate for race, Skin warm, dry, no gangrene, no non healing ulcers, no varicose veins , no hyperpigmentation, no lipo-dermatosclerosis  Neuro: Good insight and judgment, oriented to person, place, and time CN II-XII intact, gross motor and sensory intact      Labs     CBC:   WBC   Date/Time Value Ref Range Status   11/12/2015 02:01 PM 12.77 (H) 3.50 - 10.80 x10 3/uL Final   05/20/2012 01:30 PM NONE SEEN 0 - 5 Final     RBC   Date/Time Value Ref Range Status  11/12/2015 02:01 PM 4.76 4.20 - 5.40 x10 6/uL Final     Hgb   Date/Time Value Ref Range Status   11/12/2015 02:01 PM 13.6 12.0 - 16.0 g/dL Final     Hematocrit   Date/Time Value Ref Range Status   11/12/2015 02:01 PM 40.7 37.0 - 47.0 % Final     MCV   Date/Time Value Ref Range Status   11/12/2015 02:01 PM 85.5 80.0 - 100.0 fL Final     MCHC   Date/Time Value Ref Range Status   11/12/2015 02:01 PM 33.4 32.0 - 36.0 g/dL Final     RDW   Date/Time Value Ref Range Status   11/12/2015 02:01 PM 14 12 - 15 % Final     Platelets   Date/Time Value Ref Range Status   11/12/2015 02:01 PM 172 140 - 400 x10 3/uL Final     Comment:     Platelet clumps present. Platelets are adequate.       CMP:   Sodium   Date/Time Value Ref Range Status   11/12/2015 02:01 PM 138 136 - 145 mEq/L Final     Potassium   Date/Time Value Ref Range Status   11/12/2015 02:01 PM 4.1 3.5 - 5.1 mEq/L Final     Comment:     Specimen is 2+ hemolyzed, results may be affected.     Chloride   Date/Time Value Ref Range Status    11/12/2015 02:01 PM 106 100 - 111 mEq/L Final     CO2   Date/Time Value Ref Range Status   11/12/2015 02:01 PM 23 22 - 29 mEq/L Final     Glucose   Date/Time Value Ref Range Status   11/12/2015 02:01 PM 83 70 - 100 mg/dL Final     Comment:     ADA guidelines for diabetes mellitus:  Fasting:  Equal to or greater than 126 mg/dL  Random:   Equal to or greater than 200 mg/dL       BUN   Date/Time Value Ref Range Status   11/12/2015 02:01 PM 11 7 - 19 mg/dL Final     Protein, Total   Date/Time Value Ref Range Status   11/12/2015 02:01 PM 7.6 6.0 - 8.3 g/dL Final     Alkaline Phosphatase   Date/Time Value Ref Range Status   11/12/2015 02:01 PM 101 37 - 106 U/L Final     AST (SGOT)   Date/Time Value Ref Range Status   11/12/2015 02:01 PM 15 5 - 34 U/L Final     ALT   Date/Time Value Ref Range Status   11/12/2015 02:01 PM 11 0 - 55 U/L Final     Anion Gap   Date/Time Value Ref Range Status   11/12/2015 02:01 PM 9.0 5.0 - 15.0 Final       Lipid Panel   Cholesterol   Date/Time Value Ref Range Status   12/17/2009 01:00 PM 166 0 - 199 mg/dL Final     Triglycerides   Date/Time Value Ref Range Status   12/17/2009 01:00 PM 62 34 - 149 mg/dL Final     HDL   Date/Time Value Ref Range Status   12/17/2009 01:00 PM 44 >40 mg/dL Final     Comment:     HDL:       Less than 40 mg/dL - Major risk heart disease       Greater than or equal to 60 mg/dL - Negative risk factor  for heart disease       Coags:   PT   Date/Time Value Ref Range Status   04/13/2006 06:05 AM 15.9 (H) 10.8 - 13.3 SEC Final     Comment:     [facility] PT NORMAL MEAN: 12.0 SEC (EFFECTIVE August 15, 2002, 1000)     PT INR   Date/Time Value Ref Range Status   04/13/2006 06:05 AM 1.4 (H) 0.9 - 1.1 INR Final     Comment:     RECOMMENDED RANGES FOR PROTIME INR:    2.0-3.0 for most medical and surgical thromboembolic states.    2.5-3.5 for artificial heart valves and recurrent embolism or DVT.  It is recommended that the INR is to be used ONLY for the management  of  patients on stable (> 2 weeks) oral anticoagulant therapy.     PTT   Date/Time Value Ref Range Status   04/13/2006 12:10 AM 30 21 - 32 SEC Final     Comment:     In vivo therapeutic range of heparin (0.3-0.7 u/ml) correlate with the  following APTT times:  58 - 82 seconds       Assessment and Plan       1. Lymphedema of extremity  US Venous Low Extrem Duplx Dopp Uni Left       My impression is that her symptoms are most likely secondary to lymphedema since this started approximately 20 years ago in the left foot.  However I would like to make sure there is no components of venous insufficiency therefore I have recommended to the patient to undergo venous duplex ultrasound examination and depending on the findings then we will make the appropriate recommendation.  I have recommended to the patient to use knee-high compression stockings of 20 to 30 mmHg to reduce the swelling.  This was explained to the patient and I will be seeing her back after the venous duplex ultrasound has been completed.    Romelle Starcher, MD, FACS, RPVI  [facility]  Chief, Section of [name]  [place]  [facility]

## 2017-11-05 ENCOUNTER — Other Ambulatory Visit (INDEPENDENT_AMBULATORY_CARE_PROVIDER_SITE_OTHER): Payer: Self-pay

## 2017-11-05 ENCOUNTER — Ambulatory Visit (INDEPENDENT_AMBULATORY_CARE_PROVIDER_SITE_OTHER): Payer: Commercial Managed Care - POS

## 2017-11-05 DIAGNOSIS — I890 Lymphedema, not elsewhere classified: Secondary | ICD-10-CM

## 2017-11-05 NOTE — Progress Notes (Signed)
Demos and office visit notes for lymphedema pump sent to [facility] with [facility]

## 2017-11-10 ENCOUNTER — Encounter (INDEPENDENT_AMBULATORY_CARE_PROVIDER_SITE_OTHER): Payer: Self-pay | Admitting: Specialist

## 2017-11-18 ENCOUNTER — Encounter (INDEPENDENT_AMBULATORY_CARE_PROVIDER_SITE_OTHER): Payer: Self-pay | Admitting: Specialist

## 2017-11-26 ENCOUNTER — Encounter (INDEPENDENT_AMBULATORY_CARE_PROVIDER_SITE_OTHER): Payer: Self-pay | Admitting: Specialist

## 2018-02-26 ENCOUNTER — Other Ambulatory Visit (INDEPENDENT_AMBULATORY_CARE_PROVIDER_SITE_OTHER): Payer: Self-pay | Admitting: Family Medicine

## 2018-04-05 DIAGNOSIS — M5412 Radiculopathy, cervical region: Secondary | ICD-10-CM | POA: Insufficient documentation

## 2018-08-04 LAB — VITAMIN D,25 OH,TOTAL: Vitamin D 25-Hydroxy: 6.5 ng/mL — ABNORMAL LOW (ref 30.0–100.0)

## 2018-08-04 LAB — COMPREHENSIVE METABOLIC PANEL
ALT: 11 IU/L (ref 0–32)
AST (SGOT): 14 IU/L (ref 0–40)
Albumin/Globulin Ratio: 1.4 (ref 1.2–2.2)
Albumin: 4.1 g/dL (ref 3.5–5.5)
Alkaline Phosphatase: 85 IU/L (ref 39–117)
BUN / Creatinine Ratio: 11 (ref 9–23)
BUN: 11 mg/dL (ref 6–24)
Bilirubin, Total: 0.4 mg/dL (ref 0.0–1.2)
CO2: 24 mmol/L (ref 20–29)
Calcium: 8.9 mg/dL (ref 8.7–10.2)
Chloride: 103 mmol/L (ref 96–106)
Creatinine: 1.02 mg/dL — ABNORMAL HIGH (ref 0.57–1.00)
EGFR: 66 mL/min/{1.73_m2} (ref >59)
EGFR: 76 mL/min/{1.73_m2} (ref >59)
Globulin, Total: 3 g/dL (ref 1.5–4.5)
Glucose: 94 mg/dL (ref 65–99)
Potassium: 4 mmol/L (ref 3.5–5.2)
Protein, Total: 7.1 g/dL (ref 6.0–8.5)
Sodium: 139 mmol/L (ref 134–144)

## 2018-08-04 LAB — CBC AND DIFFERENTIAL
Baso(Absolute): 0 10*3/uL (ref 0.0–0.2)
Basos: 0 %
Eos: 1 %
Eosinophils Absolute: 0.1 10*3/uL (ref 0.0–0.4)
Hematocrit: 39.6 % (ref 34.0–46.6)
Hemoglobin: 13 g/dL (ref 11.1–15.9)
Immature Granulocytes Absolute: 0 10*3/uL (ref 0.0–0.1)
Immature Granulocytes: 0 %
Lymphocytes Absolute: 1.8 10*3/uL (ref 0.7–3.1)
Lymphocytes: 27 %
MCH: 28.2 pg (ref 26.6–33.0)
MCHC: 32.8 g/dL (ref 31.5–35.7)
MCV: 86 fL (ref 79–97)
Monocytes Absolute: 0.4 10*3/uL (ref 0.1–0.9)
Monocytes: 7 %
Neutrophils Absolute: 4.4 10*3/uL (ref 1.4–7.0)
Neutrophils: 65 %
Platelets: 211 10*3/uL (ref 150–379)
RBC: 4.61 x10E6/uL (ref 3.77–5.28)
RDW: 14.3 % (ref 12.3–15.4)
WBC: 6.7 10*3/uL (ref 3.4–10.8)

## 2018-08-04 LAB — FFPC LIPID PANEL AND CHOL/HDL RATIO
Cholesterol / HDL Ratio: 2.9 ratio (ref 0.0–4.4)
Cholesterol: 161 mg/dL (ref 100–199)
HDL: 55 mg/dL (ref >39)
LDL Calculated: 96 mg/dL (ref 0–99)
Triglycerides: 51 mg/dL (ref 0–149)
VLDL Calculated: 10 mg/dL (ref 5–40)

## 2018-08-04 LAB — HEMOGLOBIN A1C: Hemoglobin A1C: 5.3 % (ref 4.8–5.6)

## 2018-08-04 LAB — INSULIN, S: Insulin: 13.5 u[IU]/mL (ref 2.6–24.9)

## 2018-08-04 LAB — TSH: TSH: 0.992 u[IU]/mL (ref 0.450–4.500)

## 2018-09-14 LAB — COMPREHENSIVE METABOLIC PANEL
ALT: 8 IU/L (ref 0–32)
AST (SGOT): 16 IU/L (ref 0–40)
Albumin/Globulin Ratio: 1.2 (ref 1.2–2.2)
Albumin: 4.1 g/dL (ref 3.5–5.5)
Alkaline Phosphatase: 88 IU/L (ref 39–117)
BUN / Creatinine Ratio: 10 (ref 9–23)
BUN: 10 mg/dL (ref 6–24)
Bilirubin, Total: 0.4 mg/dL (ref 0.0–1.2)
CO2: 20 mmol/L (ref 20–29)
Calcium: 9.1 mg/dL (ref 8.7–10.2)
Chloride: 102 mmol/L (ref 96–106)
Creatinine: 1.05 mg/dL — ABNORMAL HIGH (ref 0.57–1.00)
EGFR: 63 mL/min/{1.73_m2} (ref >59)
EGFR: 73 mL/min/{1.73_m2} (ref >59)
Globulin, Total: 3.3 g/dL (ref 1.5–4.5)
Glucose: 87 mg/dL (ref 65–99)
Potassium: 3.7 mmol/L (ref 3.5–5.2)
Protein, Total: 7.4 g/dL (ref 6.0–8.5)
Sodium: 137 mmol/L (ref 134–144)

## 2018-09-14 LAB — CBC AND DIFFERENTIAL
Baso(Absolute): 0 10*3/uL (ref 0.0–0.2)
Basos: 1 %
Eos: 1 %
Eosinophils Absolute: 0.1 10*3/uL (ref 0.0–0.4)
Hematocrit: 40.9 % (ref 34.0–46.6)
Hemoglobin: 13.6 g/dL (ref 11.1–15.9)
Immature Granulocytes Absolute: 0 10*3/uL (ref 0.0–0.1)
Immature Granulocytes: 0 %
Lymphocytes Absolute: 1.7 10*3/uL (ref 0.7–3.1)
Lymphocytes: 27 %
MCH: 28 pg (ref 26.6–33.0)
MCHC: 33.3 g/dL (ref 31.5–35.7)
MCV: 84 fL (ref 79–97)
Monocytes Absolute: 0.4 10*3/uL (ref 0.1–0.9)
Monocytes: 6 %
Neutrophils Absolute: 4.2 10*3/uL (ref 1.4–7.0)
Neutrophils: 65 %
Platelets: 219 10*3/uL (ref 150–450)
RBC: 4.85 x10E6/uL (ref 3.77–5.28)
RDW: 13 % (ref 11.7–15.4)
WBC: 6.4 10*3/uL (ref 3.4–10.8)

## 2018-09-14 LAB — VITAMIN D,25 OH,TOTAL: Vitamin D 25-Hydroxy: 11.3 ng/mL — ABNORMAL LOW (ref 30.0–100.0)

## 2018-09-14 LAB — FFPC LIPID PANEL AND CHOL/HDL RATIO
Cholesterol / HDL Ratio: 3.1 ratio (ref 0.0–4.4)
Cholesterol: 184 mg/dL (ref 100–199)
HDL: 59 mg/dL (ref >39)
LDL Calculated: 114 mg/dL — ABNORMAL HIGH (ref 0–99)
Triglycerides: 53 mg/dL (ref 0–149)
VLDL Calculated: 11 mg/dL (ref 5–40)

## 2018-09-14 LAB — TSH: TSH: 0.908 u[IU]/mL (ref 0.450–4.500)

## 2018-09-14 LAB — HEMOGLOBIN A1C: Hemoglobin A1C: 5.5 % (ref 4.8–5.6)

## 2018-12-13 ENCOUNTER — Encounter (INDEPENDENT_AMBULATORY_CARE_PROVIDER_SITE_OTHER): Payer: Self-pay | Admitting: Family Medicine

## 2018-12-14 MED ORDER — LOSARTAN POTASSIUM-HCTZ 100-25 MG PO TABS
1.0000 | ORAL_TABLET | Freq: Every day | ORAL | 0 refills | Status: DC
Start: 2018-12-14 — End: 2019-01-10

## 2019-01-03 ENCOUNTER — Encounter (INDEPENDENT_AMBULATORY_CARE_PROVIDER_SITE_OTHER): Payer: Self-pay | Admitting: Family Medicine

## 2019-01-10 ENCOUNTER — Telehealth (INDEPENDENT_AMBULATORY_CARE_PROVIDER_SITE_OTHER): Payer: Commercial Managed Care - POS | Admitting: Family Medicine

## 2019-01-10 ENCOUNTER — Encounter (INDEPENDENT_AMBULATORY_CARE_PROVIDER_SITE_OTHER): Payer: Self-pay | Admitting: Family Medicine

## 2019-01-10 VITALS — Ht 69.0 in | Wt 300.0 lb

## 2019-01-10 DIAGNOSIS — I10 Essential (primary) hypertension: Secondary | ICD-10-CM

## 2019-01-10 DIAGNOSIS — Z1231 Encounter for screening mammogram for malignant neoplasm of breast: Secondary | ICD-10-CM

## 2019-01-10 DIAGNOSIS — E559 Vitamin D deficiency, unspecified: Secondary | ICD-10-CM

## 2019-01-10 MED ORDER — VITAMIN D3 1.25 MG (50000 UT) PO CAPS
ORAL_CAPSULE | ORAL | 1 refills | Status: DC
Start: 2019-01-10 — End: 2019-01-12

## 2019-01-10 MED ORDER — LOSARTAN POTASSIUM-HCTZ 100-25 MG PO TABS
1.0000 | ORAL_TABLET | Freq: Every day | ORAL | 0 refills | Status: DC
Start: 2019-01-10 — End: 2019-02-19

## 2019-01-10 NOTE — Progress Notes (Signed)
[facility] - [place]                       Date of Virtual Visit: 01/10/2019 11:23 AM        Patient ID: Leslie [name] is a 48 y.o. female.  Attending Physician: Satira Mccallum, MD       Telemedicine Eligibility:    State Location:  [x]  [place]  []  [place]  []  [place] []  [place]  []  Other:    Physical Location:  [x]  Home  []         []        []          []  Other:    Patient Identity Verification:  [x]  State Issued ID  []  Insurance Eligibility Check  []  Other:    Physical Address Verification: (for 911)  [x]  Yes  []  No    Personal identity shared with patient:  [x]  Yes  []  No    Education on nature of video visit shared with patient:  [x]  Yes  []  No    Emergency plan agreed upon with patient:  [x]  Yes  []  No    If the patient had not had this virtual visit, what would they have done?  []         []         []        []          []  Other:    Visit terminated since not appropriate for virtual care:  [x]  N/A  []  Reason:         Chief Complaint:    Chief Complaint   Patient presents with    Hypertension               HPI:    HPI   48 y/o F presents for VV    1.) Hypertension.  Pt. Reports taking medication as prescribed.  Pt. Has not checked BP  Pt. Reports no symptoms.  Pt. Reports no side effects.  Pt. Needs a refill for her losartan-hydrochlorothiazide.    Pt. Gave consent to bill insurance for today's VV    2.vitamin d def: taking once a week and sometimes forget    3. Obesity: do not check, does not feel different            Problem List:    Patient Active Problem List   Diagnosis    Lymphedema of extremity    Morbid obesity    Cervical radiculopathy    Essential hypertension    Vitamin D deficiency             Current Meds:    Outpatient Medications Marked as Taking for the 01/10/19 encounter (Telemedicine Visit) with Satira Mccallum, MD   Medication Sig Dispense Refill    Cholecalciferol (Vitamin D3) 1.25 MG (50000 UT) Cap Vitamin D2 50,000 unit capsule weekly 12  capsule 1    Ibuprofen 200 MG Cap Take by mouth as needed      losartan-hydrochlorothiazide (HYZAAR) 100-25 MG per tablet Take 1 tablet by mouth daily 30 tablet 0    [DISCONTINUED] Cholecalciferol (VITAMIN D3) 50000 units Cap Vitamin D2 50,000 unit capsule weekly      [DISCONTINUED] losartan-hydrochlorothiazide (HYZAAR) 100-25 MG per tablet Take 1 tablet by mouth daily 30 tablet 0          Allergies:    Allergies   Allergen Reactions    Valsartan Other (See Comments)  achiness    Penicillins Hives     Symptoms occurred as child  Onset date: 07-28-2004             Past Surgical History:    Past Surgical History:   Procedure Laterality Date    HYSTERECTOMY  2008    fibroids, readmitted for pelvic abscess formation POD#3    OTHER SURGICAL HISTORY  05/04/2015    Obalon balloon - have 3 balloons placed in stomach for weight loss, Dr. Georgann Housekeeper    SALPINGOOPHORECTOMY Right 2008    RSO with hysterectomy    TUBAL LIGATION  02/17/1997           Family History:    Family History   Problem Relation Age of Onset    Hypertension Mother     Coronary artery disease Mother     Heart disease Mother     Hyperlipidemia Mother     Hypertension Father     Diabetes Father     Other Father         cardiac catheterization    Hypertension Sister     Breast cancer Maternal Aunt     Uterine cancer Maternal Aunt     Breast cancer Other         maternal cousin           Social History:    Social History     Tobacco Use    Smoking status: Never Smoker    Smokeless tobacco: Never Used   Substance Use Topics    Alcohol use: Yes     Comment: wine - social, 2-4 times a month    Drug use: No          The following sections were reviewed this encounter by the provider:   Tobacco   Allergies   Meds   Problems   Med Hx   Surg Hx   Fam Hx              Vital Signs:    Ht 1.753 m (5\' 9" )    Wt 136.1 kg (300 lb)    BMI 44.30 kg/m          ROS:    Review of Systems   No sob, no cp, no swelling         Physical Exam:     Physical Exam   GENERAL APPEARANCE: alert, in no acute distress, pleasant, well nourished.   HEAD: normal appearance  EYES: no discharge  EARS: normal hearing  NECK/THYROID: appearance -supple  PSYCH: appropriate affect, appropriate mood, normal speech, normal attention        Assessment:    1. Essential hypertension  - Basic Metabolic Panel [id number]); Future  - losartan-hydrochlorothiazide (HYZAAR) 100-25 MG per tablet; Take 1 tablet by mouth daily  Dispense: 30 tablet; Refill: 0    2. Morbid obesity    3. Vitamin D deficiency  - Vitamin D,25 [place], Total; Future  - Cholecalciferol (Vitamin D3) 1.25 MG (50000 UT) Cap; Vitamin D2 50,000 unit capsule weekly  Dispense: 12 capsule; Refill: 1    4. Encounter for screening mammogram for breast cancer  - Mammo Digital Screening Bilateral W Cad; Future            Plan:    DURING THIS PANDEMIC  I recommend you have a good working thermometer, a weight scale, a blood pressure monitor with an armcuff to monitor your vitals if possible. A pulse oximeter to monitor oxygen level would  be great.    I recommend you watch the following video to maximize your care:    https://youtu.be/[email or web address]    Continue current medicines  Side effect of meds discussed  Do healthy , high fiber, low cholesterol, low carb diet  Check wt once a week , has allowed food list to use  Exercise most days  Plan for wellness visit every year  Follow up visit with [facility] in 2 months      Pt to get labs done, check bp at home and send me readings before more meds        Follow-up:    Return in about 2 months (around 03/12/2019) for wellness PE, fasting.         Satira Mccallum, MD

## 2019-01-12 ENCOUNTER — Other Ambulatory Visit (INDEPENDENT_AMBULATORY_CARE_PROVIDER_SITE_OTHER): Payer: Self-pay | Admitting: Family Medicine

## 2019-01-12 DIAGNOSIS — E559 Vitamin D deficiency, unspecified: Secondary | ICD-10-CM

## 2019-01-12 MED ORDER — ERGOCALCIFEROL 1.25 MG (50000 UT) PO CAPS
50000.00 [IU] | ORAL_CAPSULE | ORAL | 1 refills | Status: AC
Start: 2019-01-12 — End: 2019-07-11

## 2019-01-18 ENCOUNTER — Encounter (INDEPENDENT_AMBULATORY_CARE_PROVIDER_SITE_OTHER): Payer: Self-pay | Admitting: Family Medicine

## 2019-01-18 DIAGNOSIS — M62838 Other muscle spasm: Secondary | ICD-10-CM

## 2019-01-18 MED ORDER — TIZANIDINE HCL 4 MG PO TABS
ORAL_TABLET | ORAL | 0 refills | Status: DC
Start: 2019-01-18 — End: 2019-11-28

## 2019-01-18 NOTE — Progress Notes (Signed)
I called and spoke with pt. Gave condolences. Has support. Make appt with me if needing further help.  Sent tizanidine

## 2019-01-18 NOTE — Progress Notes (Signed)
Dr. Jonathon Bellows pt had a televisit with you on 01/10/19. Please advise, TY

## 2019-01-24 ENCOUNTER — Encounter (INDEPENDENT_AMBULATORY_CARE_PROVIDER_SITE_OTHER): Payer: Self-pay | Admitting: Family Medicine

## 2019-01-25 ENCOUNTER — Other Ambulatory Visit (INDEPENDENT_AMBULATORY_CARE_PROVIDER_SITE_OTHER): Payer: Self-pay

## 2019-01-25 DIAGNOSIS — Z1231 Encounter for screening mammogram for malignant neoplasm of breast: Secondary | ICD-10-CM

## 2019-01-25 DIAGNOSIS — E559 Vitamin D deficiency, unspecified: Secondary | ICD-10-CM

## 2019-01-31 ENCOUNTER — Telehealth (INDEPENDENT_AMBULATORY_CARE_PROVIDER_SITE_OTHER): Payer: Commercial Managed Care - POS | Admitting: Family Medicine

## 2019-01-31 ENCOUNTER — Encounter (INDEPENDENT_AMBULATORY_CARE_PROVIDER_SITE_OTHER): Payer: Self-pay | Admitting: Family Medicine

## 2019-01-31 VITALS — Ht 69.0 in | Wt 300.0 lb

## 2019-01-31 DIAGNOSIS — F321 Major depressive disorder, single episode, moderate: Secondary | ICD-10-CM

## 2019-01-31 DIAGNOSIS — F411 Generalized anxiety disorder: Secondary | ICD-10-CM

## 2019-01-31 DIAGNOSIS — F4321 Adjustment disorder with depressed mood: Secondary | ICD-10-CM

## 2019-01-31 DIAGNOSIS — F4329 Adjustment disorder with other symptoms: Secondary | ICD-10-CM

## 2019-01-31 DIAGNOSIS — G4700 Insomnia, unspecified: Secondary | ICD-10-CM

## 2019-01-31 MED ORDER — ESCITALOPRAM OXALATE 10 MG PO TABS
10.0000 mg | ORAL_TABLET | Freq: Every day | ORAL | 1 refills | Status: DC
Start: 2019-01-31 — End: 2019-03-22

## 2019-01-31 NOTE — Patient Instructions (Signed)
Helping Yourself Through Grief and Loss  Do what you can to stay healthy. Reaching out for support will help a great deal. You may find yourself asking: Why? Its normal to seek meaning by asking questions, but theres not always an answer for loss. With time, your loss may still be part of your life, but not the only thing in it.    Take care of yourself  Taking good care of yourself helps your body heal from the physical and emotional symptoms of grief. Pay extra attention to healthy exercise, sleep, and eating routines. What else do you need to feel better? Having family around can help you feel loved. Or you might need a walk or movie with friends to take your mind off things for a little while.  Accept support  Joining the world again is part of healing. These tips may help:   Stay in touch with family and friends, even if its hard to talk.   Tell people how they can help. It can be as simple as bringing you a meal or walking your dog.   Stick to a daily routine that keeps you connected to friends and family.   Try to avoid making major decisions.   Attend a support group of people who have been through the same type of loss.  When to get help  There's no normal length of time to grieve. But if you feel stuck and unable to move on, or if it has been 6 months or more since your loss and you still have signs of grief listed below, it may be time for professional help. Seeking professional help is not a sign of weakness. It means that you are taking responsibility for your recovery. Be alert to depression and call your healthcare provider if you:   Cant go to work or take care of the kids.   Cant eat or sleep normally.   Feel helpless, hopeless, or worthless.   Lose interest in hobbies, friends, and activities that used to give pleasure.   Gain or lose a lot of weight.   Feel your grief is getting worse, or not getting any better.   Have repeated thoughts of suicide or of harming yourself. Call  911 or a crisis hotline (you can find one online) if you have these thoughts.  At some point, youll begin thinking about the future. Youll want to look ahead and make plans. To help yourself reach this point, try to do one thing each day to join in life. Keep at it, even if it feels strange at first. Your life can never be exactly the same. But one day youll find youre living life fully again.  [employer] last reviewed this educational content on 03/20/2016   2000-2020 [employer], [place]. [address], [place], [place] [address]. All rights reserved. This information is not intended as a substitute for professional medical care. Always follow your healthcare professional's instructions.        Grief and Loss  Losing someone you care about is painful.Grief is the emotional reaction thatfollows. Its a normal process. It has both physical and emotional signs. But even with major life changes, such as the loss of a spouse or parent, you can face the loss and move on.    Grief takes many forms  Each person will have their own grief process. Grief may or may not occur in predictable stages. Or it may bounce between stages. But over time, grief will slowly  lead you to accept the loss. Many people can keep doing normaldaily activities even with bouts of grief. These normal grief reactions are common:   Not wanting to believe the loss is real   Feeling emotional numbness or shock   Feeling annoyed or outright angry   Thinking you could have done something to stop the loss   Feeling sad or even hopeless   Loss of appetite and sleep   Loss of interest in things you used to enjoy  Normal griefoften does not need to be treated. You will slowly start feeling more adjusted to your new life. In some cases grief lasts long or is more difficult. This may need treatment with talk therapy. Severe grief reactions to griefmay need treatment. So may depression that's linked to grief.If you are concerned about your  grief,talk with your healthcare provider.  [employer] last reviewed this educational content on 03/20/2016   2000-2020 [employer], [place]. [address] [place], [place] [address]. All rights reserved. This information is not intended as a substitute for professional medical care. Always follow your healthcare professional's instructions.

## 2019-01-31 NOTE — Progress Notes (Signed)
foll              [facility] [place]                       Date of Virtual Visit: 01/31/2019 9:16 AM        Patient ID: Leslie [name] is a 48 y.o. female.  Attending Physician: Satira Mccallum, MD       Telemedicine Eligibility:    State Location:  [x]  [place]  []  [place]  []  [place] []  [place]  []  Other:    Physical Location:  [x]  Home  []         []        []          []  Other:    Patient Identity Verification:  [x]  State Issued ID  []  Insurance Eligibility Check  []  Other:    Physical Address Verification: (for 911)  [x]  Yes  []  No    Personal identity shared with patient:  [x]  Yes  []  No    Education on nature of video visit shared with patient:  [x]  Yes  []  No    Emergency plan agreed upon with patient:  [x]  Yes  []  No    If the patient had not had this virtual visit, what would they have done?  []         []         []        []          []  Other:    Visit terminated since not appropriate for virtual care:  [x]  N/A  []  Reason:         Chief Complaint:    Chief Complaint   Patient presents with    Follow-up               HPI:    48 y/o F presents for a virtual visit for follow up  Verbal consent given to bill for this VV      Pt states since the passing of her son she not able to work   Pt want to take off work   She applied for medical leave   Son in CCU, pt off since 12/2018. He overdosed with xanax and fentanyl.  She did not know he was depressed. She wishes she had know so could help.  Died on January 21, 2019    Crying a lot  Hard time  Boyfriend works at night  Keep waking up  Daughter there who is grieving too    Friends visit  Xmas always with son, her birthday coming up    Scared feeling, slow breath    Feeling sad  Cannot focus               Problem List:    Patient Active Problem List   Diagnosis    Lymphedema of extremity    Morbid obesity    Cervical radiculopathy    Essential hypertension    Vitamin D deficiency             Current Meds:    Outpatient Medications  Marked as Taking for the 01/31/19 encounter (Telemedicine Visit) with Satira Mccallum, MD   Medication Sig Dispense Refill    ergocalciferol (ERGOCALCIFEROL) 1.25 MG (50000 UT) capsule Take 1 capsule (50,000 Units total) by mouth once a week 12 capsule 1    Ibuprofen 200 MG Cap Take by mouth as needed  losartan-hydrochlorothiazide (HYZAAR) 100-25 MG per tablet Take 1 tablet by mouth daily 30 tablet 0    tiZANidine (Zanaflex) 4 MG tablet One tab every 8 hrs as needed sparingly to relax muscles 30 tablet 0          Allergies:    Allergies   Allergen Reactions    Valsartan Other (See Comments)     achiness    Penicillins Hives     Symptoms occurred as child  Onset date: 07-28-2004             Past Surgical History:    Past Surgical History:   Procedure Laterality Date    HYSTERECTOMY  2008    fibroids, readmitted for pelvic abscess formation POD#3    OTHER SURGICAL HISTORY  05/04/2015    Obalon balloon - have 3 balloons placed in stomach for weight loss, Dr. Georgann Housekeeper    SALPINGOOPHORECTOMY Right 2008    RSO with hysterectomy    TUBAL LIGATION  02/17/1997           Family History:    Family History   Problem Relation Age of Onset    Hypertension Mother     Coronary artery disease Mother     Heart disease Mother     Hyperlipidemia Mother     Hypertension Father     Diabetes Father     Other Father         cardiac catheterization    Hypertension Sister     Breast cancer Maternal Aunt     Uterine cancer Maternal Aunt     Breast cancer Other         maternal cousin           Social History:    Social History     Socioeconomic History    Marital status: Legally Separated     Spouse name: Not on file    Number of children: Not on file    Years of education: Not on file    Highest education level: Not on file   Occupational History    Not on file   Social Needs    Financial resource strain: Not on file    Food insecurity     Worry: Not on file     Inability: Not on file    Transportation needs      Medical: Not on file     Non-medical: Not on file   Tobacco Use    Smoking status: Never Smoker    Smokeless tobacco: Never Used   Substance and Sexual Activity    Alcohol use: Yes     Comment: wine - social, 2-4 times a month    Drug use: No    Sexual activity: Yes     Partners: Male   Lifestyle    Physical activity     Days per week: 7 days     Minutes per session: 20 min    Stress: Not on file   Relationships    Social connections     Talks on phone: Not on file     Gets together: Not on file     Attends religious service: Not on file     Active member of club or organization: Not on file     Attends meetings of clubs or organizations: Not on file     Relationship status: Not on file    Intimate partner violence     Fear of current or ex partner:  Not on file     Emotionally abused: Not on file     Physically abused: Not on file     Forced sexual activity: Not on file   Other Topics Concern    Not on file   Social History Narrative    Has elliptical machine        Lives with boyfriend, daughter home        Son died on 07-Feb-2019 in CCU                  Vital Signs:    Ht 1.753 m (5\' 9" )    Wt 136.1 kg (300 lb)    BMI 44.30 kg/m          ROS:    Review of Systems   No suicidal thoughts but feel ok if no more  No harming thoughts  Feel gasping for air, went  Out yesterday and wanted to run back in  No exercise        Physical Exam:    Physical Exam   GENERAL APPEARANCE: alert, in no acute distress, sad, well nourished. crying   HEAD: normal appearance  EYES: no discharge  EARS: normal hearing  NECK/THYROID: appearance -supple  PSYCH: appropriate affect, appropriate mood, normal speech, normal attention        Assessment:    1. Complicated grieving  - escitalopram (LEXAPRO) 10 MG tablet; Take 1 tablet (10 mg total) by mouth daily  Dispense: 30 tablet; Refill: 1    2. GAD (generalized anxiety disorder)  - escitalopram (LEXAPRO) 10 MG tablet; Take 1 tablet (10 mg total) by mouth daily  Dispense: 30  tablet; Refill: 1    3. Insomnia, unspecified type    4. Current moderate episode of major depressive disorder without prior episode  - escitalopram (LEXAPRO) 10 MG tablet; Take 1 tablet (10 mg total) by mouth daily  Dispense: 30 tablet; Refill: 1            Plan:    Add lexapro to help  Continue current  Other medicines  Side effect of meds discussed  Do healthy , high fiber, low cholesterol, low carb diet  Exercise most days  counselling important, encouraged  Follow up visit with [facility] in 1 month  Forms will be filled, not able to work since 01/07/2019,   25 min spent counselling support  Try meditation daily     Counselling resources:    1.  Check out www.betterhelp.com to be matched with a right counsellor for you.    2. Behavioral health services: [facility]       [facility] [place] location: [phone]      3.[facility] [place]: [phone]: check and ask for Lloyd Huger [facility] if possible specializing in grief      4. [facility] : [phone]      Meditation    Daily meditation can have a great impact on your sense of calm. Learning how to meditate for out of our hectic lives. Recent research has shown that meditating 20 minutes twice a day can actually reduce blockages in your blood vessels, significantly lowering the risk of sudden death by heart attack or stroke.  The following addresses how to meditate, and how to choose your own daily meditation practices.   To meditate, one must break away, however briefly, from the world. Turn off your  cell phone and pager, turn off the TV, computer and whatever else that may be a distraction. Allow no interruptions during this special time.   Choose a location or area; indoors or outdoors, that provides a calm, quiet, and peaceful environment.   Find a way of sitting that is comfortable for you.    Try to keep your eyes softly  open to help keep all of your sense open. Dont focus on anything particular. The goal is not to fall asleep but to find yourself in a state of relaxed alertness.   It is recommended to meditate 20 minutes twice daily, but its not how long you meditate, its whether the practice brings you to a certain state of mindfulness and presence, where you are a little open and able to connect with your heart essence.    Four basic ways to practice meditation  1) Breathing: This is the most widely used techniques. First, exhale strongly a few times to clear the base of the lungs of carbon dioxide. Then follow with the deep breathing method, allowing your belly to expand and retract.  2) Observation: Allow your mind to rest lightly on an object or an image.  3) Recite a mantra: meaning that which protects the mind. So reciting a mantra protects you with a spiritual power. Choose something with meaning for you within you spiritual tradition.  4) Guided Meditation: Its like a guided imagery, a powerful technique that focuses and directs the imagination toward a goal. For example, a diver imagining a perfect dive before leaving the platform.  The benefit of meditation is subtle and its effects will become apparent to you later. You may notice you respond to a situation calmer or not be triggered by a situation that normally would disturb you. It is a subtle transformation that happens not only in your mind and your emotions but also in your body. This transformation is a healing one.      Forms will be filled: off from 01/07/2019 to end of 01/2019  Starting back on first week of Jan 2021 full time          Follow-up:    Return in about 4 weeks (around 02/28/2019) for visit, anxiety, insomnia grief.         Satira Mccallum, MD

## 2019-02-01 ENCOUNTER — Encounter (INDEPENDENT_AMBULATORY_CARE_PROVIDER_SITE_OTHER): Payer: Self-pay

## 2019-02-01 ENCOUNTER — Telehealth (INDEPENDENT_AMBULATORY_CARE_PROVIDER_SITE_OTHER): Payer: Self-pay | Admitting: Family Medicine

## 2019-02-01 ENCOUNTER — Encounter (INDEPENDENT_AMBULATORY_CARE_PROVIDER_SITE_OTHER): Payer: Self-pay | Admitting: Family Medicine

## 2019-02-01 NOTE — Telephone Encounter (Signed)
Forms sent via my chart to pt

## 2019-02-01 NOTE — Telephone Encounter (Signed)
FMLA form filled and in [employer] folder. pls fax for pt and notify her done TY

## 2019-02-01 NOTE — Progress Notes (Signed)
Were these faxed as well?

## 2019-02-01 NOTE — Progress Notes (Signed)
For sent to patient via mychart messgae

## 2019-02-09 NOTE — Progress Notes (Signed)
Left a vm to make a follow up appointment for 30 min per Dr.KC

## 2019-02-15 ENCOUNTER — Encounter (INDEPENDENT_AMBULATORY_CARE_PROVIDER_SITE_OTHER): Payer: Self-pay

## 2019-02-15 ENCOUNTER — Encounter (INDEPENDENT_AMBULATORY_CARE_PROVIDER_SITE_OTHER): Payer: Self-pay | Admitting: Family Medicine

## 2019-02-18 DIAGNOSIS — C50919 Malignant neoplasm of unspecified site of unspecified female breast: Secondary | ICD-10-CM

## 2019-02-18 HISTORY — DX: Malignant neoplasm of unspecified site of unspecified female breast: C50.919

## 2019-02-19 ENCOUNTER — Encounter (INDEPENDENT_AMBULATORY_CARE_PROVIDER_SITE_OTHER): Payer: Self-pay | Admitting: Family Medicine

## 2019-02-19 DIAGNOSIS — I10 Essential (primary) hypertension: Secondary | ICD-10-CM

## 2019-02-19 MED ORDER — LOSARTAN POTASSIUM-HCTZ 100-25 MG PO TABS
1.0000 | ORAL_TABLET | Freq: Every day | ORAL | 1 refills | Status: DC
Start: 2019-02-19 — End: 2019-05-11

## 2019-02-21 ENCOUNTER — Encounter (INDEPENDENT_AMBULATORY_CARE_PROVIDER_SITE_OTHER): Payer: Self-pay | Admitting: Family Medicine

## 2019-02-25 ENCOUNTER — Encounter (INDEPENDENT_AMBULATORY_CARE_PROVIDER_SITE_OTHER): Payer: Self-pay | Admitting: Family Medicine

## 2019-02-28 ENCOUNTER — Telehealth (INDEPENDENT_AMBULATORY_CARE_PROVIDER_SITE_OTHER): Payer: Self-pay | Admitting: Family Medicine

## 2019-02-28 NOTE — Telephone Encounter (Signed)
Pl advise pt VV15 min to discuss test result TY

## 2019-03-01 ENCOUNTER — Encounter (INDEPENDENT_AMBULATORY_CARE_PROVIDER_SITE_OTHER): Payer: Self-pay | Admitting: Family Medicine

## 2019-03-01 NOTE — Telephone Encounter (Signed)
Spoke with pt. Pt reported she scheduled a biopsy for 03/07/2019. Pt declined the VV to discuss results until after the biopsy.

## 2019-03-10 ENCOUNTER — Encounter (INDEPENDENT_AMBULATORY_CARE_PROVIDER_SITE_OTHER): Payer: Self-pay | Admitting: Family Medicine

## 2019-03-13 ENCOUNTER — Telehealth (INDEPENDENT_AMBULATORY_CARE_PROVIDER_SITE_OTHER): Payer: Self-pay | Admitting: Family Medicine

## 2019-03-13 NOTE — Telephone Encounter (Signed)
On 03/11/2019, I called pathologist to confirm re: dx. Then I called [facility] and found out pt has appt on 03/14/2019 to discuss report and plan

## 2019-03-14 ENCOUNTER — Telehealth (INDEPENDENT_AMBULATORY_CARE_PROVIDER_SITE_OTHER): Payer: Self-pay | Admitting: Family Medicine

## 2019-03-14 NOTE — Telephone Encounter (Signed)
S/w  Pam from [facility] the breast biopsy came back positive for breast cancer, pt has appt today to go over her results with the radilogist   They will also referral her to breasts surgeon in the area

## 2019-03-14 NOTE — Telephone Encounter (Signed)
Pam from [facility] calling to speak to nurse regarding patient breast biopsy. [phone]

## 2019-03-15 ENCOUNTER — Encounter (INDEPENDENT_AMBULATORY_CARE_PROVIDER_SITE_OTHER): Payer: Self-pay | Admitting: Family Medicine

## 2019-03-15 ENCOUNTER — Telehealth: Payer: Self-pay

## 2019-03-15 NOTE — Telephone Encounter (Signed)
[facility] New Patient Coordinator Note    Physician/Location Preference:    Location Preference: [place]  Physician Preference: Dr. Smitty Cords or Dr. Tye Savoy     Referral:    Referring Provider: Dr. Jonathon Bellows    Is Referral required per insurance? No      History:    Personal Hx of Cancer: No     Prior Chemotherapy - No   Prior Radiation - No    Prior Surgery related to Cancer - No    Family Hx of Cancer : Yes - Maternal aunt - breast cancer paternal cousin - breast cancer    Biopsy History:    Yes - Location Performed: Other [facility]    Imaging History:    Prior Imaging: Yes     Type of Imaging: Mammogram   Location Performed: [facility]    Other:     Are there patient owned records that will be brought to the first appointment?Yes Patient to bring imaging with her to appointment     Has the Appointment been scheduled? Yes 1/29 with Dr. Tye Savoy

## 2019-03-16 ENCOUNTER — Encounter (INDEPENDENT_AMBULATORY_CARE_PROVIDER_SITE_OTHER): Payer: Self-pay | Admitting: Family Medicine

## 2019-03-18 ENCOUNTER — Ambulatory Visit: Payer: Commercial Managed Care - POS | Admitting: Surgery

## 2019-03-21 NOTE — Progress Notes (Signed)
New Patient H&P - Breast Surgery    CC: LEFT breast TNBC    History of Present Illness:   Leslie [name] is a 49 y.o. 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 Patient had a BILATERAL mammogram on Jan 2021 and noted LEFT breast 3.1 x 2cm hypoechoic mass and mildly enlarged axillary node abnormality. She underwent a CNB of the LEFT breast 2:00 mass and node- LEFT breast mass was IDC ER/PR/HER2 negative , node was benign. She is here for consultation.    Patient denies any palpable masses, skin changes, nipple inversion/retraction/discharge, adenopathy, breast pain. Patient denies any personal or family hx of ovarian, colon, pancreatic cancer or melanoma. Denies previous hx of breast bx, breast surgery, chest wall radiation or HRT use. Family Hx of Cancer  Maternal aunt and paternal cousin     Rad/Path: [name]      Breast Cancer Risk Factors:  Age at Menarche:  24     Age at 77st FTP:  34    Still menstruating?:  y  Surgical History:           Breast biopsies or surgeries: no         Hysterectomy: no  Estrogen Therapy:          Oral Contraceptives: yes         Hormone Replacement Therapy:  no         Reproductive Assistance Therapies:  no  Family History of Cancer:            Breast:   MA         Ovarian:  no         Colon:  no         Prostate:  no         Pancreatic:  no         Melanoma: no  Ashkenazi Jewish Ancestry: no     OB History   Gravida Para Term Preterm AB Living   3 3 3  0 0 2   SAB TAB Ectopic Multiple Live Births   0 0 0 0 2       Past Medical History:   Diagnosis Date    Fibroid     Herpes simplex without mention of complication 2003    possible recurrance in 2013    Hypertension     Lymphedema of extremity 11/03/2017    Neurologic disorder     brachial neuritis    Obesity     Urinary tract infection     Vitamin D deficiency    :    Past Surgical History:   Procedure Laterality Date    HYSTERECTOMY  2008    fibroids, readmitted for pelvic abscess formation  POD#3    OTHER SURGICAL HISTORY  05/04/2015    Obalon balloon - have 3 balloons placed in stomach for weight loss, Dr. Georgann Housekeeper    SALPINGOOPHORECTOMY Right 2008    RSO with hysterectomy    TUBAL LIGATION  02/17/1997   :      Current Outpatient Medications:     ergocalciferol (ERGOCALCIFEROL) 1.25 MG (50000 UT) capsule, Take 1 capsule (50,000 Units total) by mouth once a week, Disp: 12 capsule, Rfl: 1    escitalopram (LEXAPRO) 10 MG tablet, Take 1 tablet (10 mg total) by mouth daily, Disp: 30 tablet, Rfl: 1    Ibuprofen 200 MG Cap, Take by mouth as needed, Disp: , Rfl:  losartan-hydrochlorothiazide (HYZAAR) 100-25 MG per tablet, Take 1 tablet by mouth daily, Disp: 30 tablet, Rfl: 1    tiZANidine (Zanaflex) 4 MG tablet, One tab every 8 hrs as needed sparingly to relax muscles, Disp: 30 tablet, Rfl: 0    Allergies:  Valsartan and Penicillins    Family History:  Her family history includes Breast cancer in her maternal aunt and another family member; Coronary artery disease in her mother; Diabetes in her father; Heart disease in her mother; Hyperlipidemia in her mother; Hypertension in her father, mother, and sister; Other in her father; Uterine cancer in her maternal aunt.    Social History:  Social History     Socioeconomic History    Marital status: Legally Separated     Spouse name: None    Number of children: None    Years of education: None    Highest education level: None   Occupational History    None   Social Engineer, site strain: None    Food insecurity     Worry: None     Inability: None    Transportation needs     Medical: None     Non-medical: None   Tobacco Use    Smoking status: Never Smoker    Smokeless tobacco: Never Used   Substance and Sexual Activity    Alcohol use: Yes     Comment: wine - social, 2-4 times a month    Drug use: No    Sexual activity: Yes     Partners: Male   Lifestyle    Physical activity     Days per week: 7 days     Minutes per  session: 20 min    Stress: None   Relationships    Social connections     Talks on phone: None     Gets together: None     Attends religious service: None     Active member of club or organization: None     Attends meetings of clubs or organizations: None     Relationship status: None    Intimate partner violence     Fear of current or ex partner: None     Emotionally abused: None     Physically abused: None     Forced sexual activity: None   Other Topics Concern    None   Social History Narrative    Has elliptical machine        Lives with boyfriend, daughter home        Son died on 01-27-2019 in CCU       Review of Systems  Review of Systems                 Physical Exam  BP 133/83    Pulse 94    Temp 97.5 F (36.4 C) (Temporal)    Ht 1.753 m (5\' 9" )    Wt 139.3 kg (307 lb)    BMI 45.34 kg/m     Constitutional: Well-appearing, comfortable woman in no acute distress.   Head: Normocephalic and atraumatic.   Eyes: No scleral icterus.   Neck: Normal range of motion. Neck supple. No tracheal deviation present.   Cardiovascular: Regular rate and rhythm, no murmurs.   Pulmonary/Chest: Clear to auscultation bilaterally. Effort normal.   Breast Exam: Exam was performed accompanied. Patient was examined in supine and upright position.Bilateral breast symmetric. Ptosis Grade 3. RIGHT Breast: No palpable masses, no skin changes, no nipple inversion/retraction/discharge,  no adenopathy LEFT Breast: No palpable masses, no skin changes, no nipple inversion/retraction/discharge, no adenopathy.   Abdominal: She exhibits no distension.   Musculoskeletal: She exhibits no edema.   Neurological: She is alert and oriented to person, place, and time.   Skin: Skin is warm and dry. She is not diaphoretic. No erythema.   Psychiatric: Mood, memory, affect and judgment normal.     Assessment:  49 y.o. 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 Clinically Stage II (cT2N0M0).    Patient Active Problem List    Diagnosis    Lymphedema of extremity    Morbid obesity    Cervical radiculopathy    Essential hypertension    Vitamin D deficiency     I spent 60 total minutes in the care of this patient, greater than 50% in coordination of care and counseling patient/family about the natural hx of breast cancer.     I reviewed the images with patient and discussed her pathology report. We discussed with her the surgical management of breast cancer management which includes mastectomy and breast conserving therapy (BCT) involving lumpectomy and radiation.  The second option is mastectomy with or without immediate reconstruction.   I described to her that there is no differences in overall survival regarding the different surgical modalities; however, there is a slightly higher risk of local recurrence in women having BCT versus mastectomy. We discussed third option and the one I recommend which is pre-op chemo followed by surgery and +/- radiation. I discussed for TNBC chemotherapy is indicated and pre-op we can assess response and if residual disease she can obtain adjuvant therapy. I recommend she had genetic testing and meet with medical oncology as well as BILATERAL breast MRI.   A personalized treatment plan was given to patient and is scanned in patients chart.    Plan:   BILATERAL breast MRI  Genetics  Medical oncology for NAC  Follow up with me after  Call prior if any issues    Dairl Ponder. Tye Savoy, MD    Dairl Ponder. Tye Savoy MD  Surgeon, [facility]  [facility] Department of Surgery    03/22/2019 2:22 PM            CC List :  No Recipients

## 2019-03-22 ENCOUNTER — Encounter (HOSPITAL_BASED_OUTPATIENT_CLINIC_OR_DEPARTMENT_OTHER): Payer: Self-pay

## 2019-03-22 ENCOUNTER — Encounter (HOSPITAL_BASED_OUTPATIENT_CLINIC_OR_DEPARTMENT_OTHER): Payer: Self-pay | Admitting: Surgery

## 2019-03-22 ENCOUNTER — Ambulatory Visit (HOSPITAL_BASED_OUTPATIENT_CLINIC_OR_DEPARTMENT_OTHER): Payer: Commercial Managed Care - POS | Admitting: Surgery

## 2019-03-22 ENCOUNTER — Ambulatory Visit (INDEPENDENT_AMBULATORY_CARE_PROVIDER_SITE_OTHER): Payer: Commercial Managed Care - POS | Admitting: Surgery

## 2019-03-22 VITALS — BP 133/83 | HR 94 | Temp 97.5°F | Ht 69.0 in | Wt 307.0 lb

## 2019-03-22 DIAGNOSIS — C50912 Malignant neoplasm of unspecified site of left female breast: Secondary | ICD-10-CM

## 2019-03-22 NOTE — Progress Notes (Addendum)
Met with patient after her initial appointment with Dr. 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 Provided reputable websites for further information, to include [facility]       Appointments:    MRI -with  FRC on 02/05 @ 4:45 pm @ [facility]    Med/Onc with Dr. Ladell Heads on 02/09 @ 3 pm at [facility]    E mail referral request sent to genetics. Patient aware they will reach out to her.    F/U with Dr. Tye Savoy - on 02/16 @ 8:30 am @ [facility]      Reviewed new patient folder materials and upcoming appointments. Discussed that the MRI is more sensitive than a mammogram which can reveal more areas of concern.   Patient verbalizes understanding the reason for the images to be done and that the plan of care and treatment is  based on those results. All questions answered at this time. Advised to call back with any further questions.

## 2019-03-25 ENCOUNTER — Other Ambulatory Visit (HOSPITAL_BASED_OUTPATIENT_CLINIC_OR_DEPARTMENT_OTHER): Payer: Self-pay | Admitting: Surgery

## 2019-03-25 ENCOUNTER — Ambulatory Visit
Admission: RE | Admit: 2019-03-25 | Discharge: 2019-03-25 | Disposition: A | Discharge status: Home / Self Care | Payer: Commercial Managed Care - POS | Source: Ambulatory Visit | Attending: Surgery | Admitting: Surgery

## 2019-03-25 DIAGNOSIS — C50919 Malignant neoplasm of unspecified site of unspecified female breast: Secondary | ICD-10-CM

## 2019-03-25 DIAGNOSIS — Z9889 Other specified postprocedural states: Secondary | ICD-10-CM | POA: Insufficient documentation

## 2019-03-25 DIAGNOSIS — C50812 Malignant neoplasm of overlapping sites of left female breast: Secondary | ICD-10-CM | POA: Insufficient documentation

## 2019-03-25 MED ORDER — GADOBUTROL 1 MMOL/ML IV SOLN
10.00 mL | Freq: Once | INTRAVENOUS | Status: AC | PRN
Start: 2019-03-25 — End: 2019-03-25
  Administered 2019-03-25: 10 mmol via INTRAVENOUS
  Filled 2019-03-25: qty 10

## 2019-03-28 ENCOUNTER — Encounter (FREE_STANDING_LABORATORY_FACILITY): Payer: Commercial Managed Care - POS

## 2019-03-28 ENCOUNTER — Other Ambulatory Visit: Payer: Self-pay

## 2019-03-28 DIAGNOSIS — C50912 Malignant neoplasm of unspecified site of left female breast: Secondary | ICD-10-CM

## 2019-03-28 LAB — SURGICAL PATHOLOGY EXAM

## 2019-03-28 NOTE — Progress Notes (Signed)
Pathology slides arrived from [facility]. Will be sent to [facility] Pathology department for second opinion.

## 2019-03-29 ENCOUNTER — Encounter (HOSPITAL_BASED_OUTPATIENT_CLINIC_OR_DEPARTMENT_OTHER): Payer: Self-pay

## 2019-03-29 ENCOUNTER — Other Ambulatory Visit: Payer: Self-pay | Admitting: Internal Medicine

## 2019-03-29 ENCOUNTER — Telehealth: Payer: Self-pay | Admitting: Internal Medicine

## 2019-03-29 ENCOUNTER — Ambulatory Visit: Payer: Self-pay

## 2019-03-29 ENCOUNTER — Telehealth (INDEPENDENT_AMBULATORY_CARE_PROVIDER_SITE_OTHER): Payer: Commercial Managed Care - POS | Admitting: Family Medicine

## 2019-03-29 ENCOUNTER — Ambulatory Visit: Payer: Commercial Managed Care - POS | Attending: Internal Medicine | Admitting: Internal Medicine

## 2019-03-29 ENCOUNTER — Encounter: Payer: Self-pay | Admitting: Internal Medicine

## 2019-03-29 ENCOUNTER — Other Ambulatory Visit (FREE_STANDING_LABORATORY_FACILITY): Payer: Commercial Managed Care - POS

## 2019-03-29 VITALS — BP 121/83 | HR 87 | Temp 99.4°F | Resp 20 | Ht 69.0 in | Wt 303.3 lb

## 2019-03-29 DIAGNOSIS — C50912 Malignant neoplasm of unspecified site of left female breast: Secondary | ICD-10-CM

## 2019-03-29 DIAGNOSIS — Z171 Estrogen receptor negative status [ER-]: Secondary | ICD-10-CM

## 2019-03-29 DIAGNOSIS — I10 Essential (primary) hypertension: Secondary | ICD-10-CM

## 2019-03-29 DIAGNOSIS — Z170 Estrogen receptor positive status [ER+]: Secondary | ICD-10-CM

## 2019-03-29 DIAGNOSIS — C50412 Malignant neoplasm of upper-outer quadrant of left female breast: Secondary | ICD-10-CM

## 2019-03-29 DIAGNOSIS — E559 Vitamin D deficiency, unspecified: Secondary | ICD-10-CM

## 2019-03-29 DIAGNOSIS — M5412 Radiculopathy, cervical region: Secondary | ICD-10-CM

## 2019-03-29 DIAGNOSIS — I890 Lymphedema, not elsewhere classified: Secondary | ICD-10-CM

## 2019-03-29 DIAGNOSIS — C50419 Malignant neoplasm of upper-outer quadrant of unspecified female breast: Secondary | ICD-10-CM | POA: Insufficient documentation

## 2019-03-29 LAB — COMPREHENSIVE METABOLIC PANEL
ALT: 14 U/L (ref 0–55)
AST (SGOT): 14 U/L (ref 5–34)
Albumin/Globulin Ratio: 0.9 (ref 0.9–2.2)
Albumin: 3.7 g/dL (ref 3.5–5.0)
Alkaline Phosphatase: 80 U/L (ref 37–106)
Anion Gap: 11 (ref 5.0–15.0)
BUN: 15.8 mg/dL (ref 7.0–19.0)
Bilirubin, Total: 0.3 mg/dL (ref 0.1–1.2)
CO2: 23 mEq/L (ref 21–29)
Calcium: 9.1 mg/dL (ref 8.5–10.5)
Chloride: 103 mEq/L (ref 100–111)
Creatinine: 1.2 mg/dL (ref 0.4–1.5)
Globulin: 4.3 g/dL — ABNORMAL HIGH (ref 2.0–3.7)
Glucose: 85 mg/dL (ref 70–100)
Potassium: 4 mEq/L (ref 3.5–5.1)
Protein, Total: 8 g/dL (ref 6.0–8.3)
Sodium: 137 mEq/L (ref 136–145)

## 2019-03-29 LAB — CBC AND DIFFERENTIAL
Absolute NRBC: 0 10*3/uL (ref 0.00–0.00)
Basophils Absolute Automated: 0.03 10*3/uL (ref 0.00–0.08)
Basophils Automated: 0.3 %
Eosinophils Absolute Automated: 0.11 10*3/uL (ref 0.00–0.44)
Eosinophils Automated: 1.1 %
Hematocrit: 39.5 % (ref 34.7–43.7)
Hgb: 12.7 g/dL (ref 11.4–14.8)
Immature Granulocytes Absolute: 0.02 10*3/uL (ref 0.00–0.07)
Immature Granulocytes: 0.2 %
Lymphocytes Absolute Automated: 2.89 10*3/uL (ref 0.42–3.22)
Lymphocytes Automated: 28.5 %
MCH: 27.9 pg (ref 25.1–33.5)
MCHC: 32.2 g/dL (ref 31.5–35.8)
MCV: 86.8 fL (ref 78.0–96.0)
MPV: 10.7 fL (ref 8.9–12.5)
Monocytes Absolute Automated: 0.91 10*3/uL — ABNORMAL HIGH (ref 0.21–0.85)
Monocytes: 9 %
Neutrophils Absolute: 6.17 10*3/uL (ref 1.10–6.33)
Neutrophils: 60.9 %
Nucleated RBC: 0 /100 WBC (ref 0.0–0.0)
Platelets: 192 10*3/uL (ref 142–346)
RBC: 4.55 10*6/uL (ref 3.90–5.10)
RDW: 14 % (ref 11–15)
WBC: 10.13 10*3/uL — ABNORMAL HIGH (ref 3.10–9.50)

## 2019-03-29 LAB — LAB USE ONLY - HISTORICAL SURGICAL PATHOLOGY

## 2019-03-29 LAB — PT AND APTT
PT INR: 1.5 — ABNORMAL HIGH (ref 0.9–1.1)
PT: 18.4 s — ABNORMAL HIGH (ref 12.6–15.0)
PTT: 44 s — ABNORMAL HIGH (ref 23–37)

## 2019-03-29 LAB — HEMOLYSIS INDEX: Hemolysis Index: 20 — ABNORMAL HIGH (ref 0–18)

## 2019-03-29 LAB — GFR: EGFR: 58

## 2019-03-29 LAB — TROPONIN I: Troponin I: <0.01 ng/mL (ref 0.00–0.05)

## 2019-03-29 LAB — B-TYPE NATRIURETIC PEPTIDE: B-Natriuretic Peptide: <10 pg/mL (ref 0–100)

## 2019-03-29 LAB — HEPATITIS B SURFACE ANTIGEN W/ REFLEX TO CONFIRMATION: Hepatitis B Surface Antigen: NONREACTIVE

## 2019-03-29 LAB — HEPATITIS C ANTIBODY: Hepatitis C, AB: NONREACTIVE

## 2019-03-29 NOTE — Telephone Encounter (Signed)
Per checkout sheet:  1.Port Placement(LVM @FO  IR)  2.Echo (email sent to [facility] heart)  3.Referral to Cardio- ONC(email sent)  4.Teaching (scheduled for 04/04/2019)  5.3 week follow up FO location (04/25/2019)

## 2019-03-29 NOTE — Telephone Encounter (Signed)
Please schedule chemo and follow up in FO.

## 2019-03-29 NOTE — Telephone Encounter (Signed)
---   dd AC followed by weekly taxol/carbo: Doxorubicin 60 mg/m2 IV day 1 + Cytoxan 600 mg/m2 IV Day 1 q 14 days x 4 cycles with Neulasta 6 mg Day 1 with each cycle followed by paclitaxel 80 mg/m2 + Carbo AUC 1.5 weekly x 12 weeks.   Start chemotherapy within 10 days from today.     CBC, CMP, hepatitis B panel, PT, PTT, BNP, troponin, bhcg today.    Order baseline ECHO with strain and schedule IPORT placement and chemo teaching.   Referral to cardio-oncology.      RTC in 3 weeks.

## 2019-03-29 NOTE — Assessment & Plan Note (Signed)
49 yo female with newly diagnosed IDC of the left breast, clinical stage II, cT2, cN0, cMx, G3, ER 0%, PR 0%, Her 2 neu negative (1+) by IHC, Ki 67 80%, referred for neoadjuvant chemotherapy.     I discussed with the patient chemotherapy with dd AC x 4 followed by paclitaxel with carboplatin based on superior pathologic responses when carboplatin is added to AC/taxol. There are 2 large randomized studies, CALGB 16109 and GeparSixto showing a significantly higher pCR with addition of carbo to AC-->T from 41% to 54% and 47% to 64%. Additionally, the GeparSixto trial showed a 10% absolute improvement in [facility].     --- dd AC followed by weekly taxol/carbo: Doxorubicin 60 mg/m2 IV day 1 + Cytoxan 600 mg/m2 IV Day 1 q 14 days x 4 cycles with Neulasta 6 mg Day 1 with each cycle followed by paclitaxel 80 mg/m2 + Carbo AUC 1.5 weekly x 12 weeks.       Chemotherapy treatment was discussed with the patient. The patient was informed of possible side effects that could include nausea, vomiting, diarrhea, constipation, stomatitis, cardiac toxicity, renal toxicity, neuropathy, alopecia, fatigue, and pancytopenia, MDS/leukemia.       CBC, CMP, hepatitis B, PT, PTT, BNP, troponin.   Order baseline ECHO and schedule IPORT placement and chemo teaching.   Referral to cardio-oncology.   Discussed cryotherapy to prevent CIPN and digni caps to prevent chemotherapy induced alopecia.      Chemo to start within 10 days from today.     RTC in 3 weeks

## 2019-03-29 NOTE — Progress Notes (Signed)
[facility]  [address] [name]  [phone]      [name] Office  [phone]          Visit Date: 03/29/2019    Chief Complaint   Patient presents with    Breast Cancer New Diagnosis     discuss neoadjuvant chemotherapy          HISTORY OF PRESENT ILLNESS:  New patient. Please refer to oncology history.      Oncology History Overview Note   Physician Requesting Consult: de Lawson Radar, Dairl Ponder, MD    Breast Surgeon: Orlene Och, MD  Radiation Oncologist:  Plastic Surgeon:   PCP: Satira Mccallum, MD      Leslie [name] is a 49 y.o.-year-old female with newly diagnosed IDC of the left breast.   She presented with abnormal mammogram.     Bilateral screening mammogram 02/2019 showed left breast 3.1 x 2 m hypoechoic mass and midly enlarged axillary node abnormality.     CNB left breast mass 2 OC and left axillary node 03/07/2019 [facility]) showed:   A) Breast: IDC, G3, ER 0%, PR 0%, Her 2 neu negative (1+) by IHC, Ki 67 80%  B) B) Lymph node, left axillary: benign-appearing lymph node.    Bilateral breast MRI and Genetic consult ordered. Path review pending.       Breast Cancer Risk Factors:  Age at Menarche:  89  Age at 47st FTP:  49    Still menstruating?:  No   Last menses: 2008   Hysterectomy: Yes 2008 TAH+ left oophorectomy   Estrogen Therapy:          Oral Contraceptives: No           Hormone Replacement Therapy:  No         Reproductive Assistance Therapies:  No  Family History of Cancer:            Breast:   Yes maternal aunt age 78, maternal cousin age 62, paternal cousin age 19,       Ashkenazi Jewish Ancestry: No     Genetic testing: pending       Denies F/C/N/V/diarrhea. No CP/SOB. No HA/visual changes/focal weakness.        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    Past Medical History:   Diagnosis Date    Fibroid     Herpes simplex without mention of complication 2003    possible recurrance in 2013    Hypertension     Lymphedema of extremity 11/03/2017    Neurologic disorder     brachial neuritis    Obesity     Urinary tract infection     Vitamin D deficiency      Past Surgical History:   Procedure Laterality Date    HYSTERECTOMY  2008    fibroids, readmitted for pelvic abscess formation POD#3    OTHER SURGICAL HISTORY  05/04/2015    Obalon balloon - have 3 balloons placed in stomach for weight loss, Dr. Georgann Housekeeper    SALPINGOOPHORECTOMY Right 2008    RSO with hysterectomy    TUBAL LIGATION  02/17/1997     Social History  Socioeconomic History    Marital status: Legally Separated     Spouse name: None    Number of children: None    Years of education: None    Highest education level: None   Occupational History    None   Social Engineer, site strain: None    Food insecurity     Worry: None     Inability: None    Transportation needs     Medical: None     Non-medical: None   Tobacco Use    Smoking status: Never Smoker    Smokeless tobacco: Never Used   Substance and Sexual Activity    Alcohol use: Yes     Comment: wine - social, 2-4 times a month    Drug use: No    Sexual activity: Yes     Partners: Male   Lifestyle    Physical activity     Days per week: 7 days     Minutes per session: 20 min    Stress: None   Relationships    Social connections     Talks on phone: None     Gets together: None     Attends religious service: None     Active member of club or organization: None     Attends meetings of clubs or organizations: None     Relationship status: None    Intimate partner violence     Fear of current or ex partner: None     Emotionally abused: None     Physically abused: None     Forced sexual activity: None   Other Topics Concern    None   Social History Narrative    Has  elliptical machine        Lives with boyfriend, daughter home        Son died on 2019/01/20 in CCU      Family History   Problem Relation Age of Onset    Hypertension Mother     Coronary artery disease Mother     Heart disease Mother     Hyperlipidemia Mother     Hypertension Father     Diabetes Father     Other Father         cardiac catheterization    Hypertension Sister     Breast cancer Maternal Aunt     Uterine cancer Maternal Aunt     Vaginal cancer Maternal Aunt     Breast cancer Other         maternal cousin    Ovarian cancer Neg Hx      OB History     Gravida   3    Para   3    Term   3    Preterm        AB        Living   2       SAB        TAB        Ectopic        Multiple        Live Births   2                Allergies   Allergen Reactions    Valsartan Other (See Comments)     achiness    Penicillins Hives     Symptoms occurred as child  Onset date: 07-28-2004     has a current medication list  which includes the following prescription(s): ergocalciferol, losartan-hydrochlorothiazide, and tizanidine.        REVIEW OF SYSTEMS:    Constitutional:  negative  Eyes: negative  Ears, nose, mouth, throat, and face: negative  Respiratory: negative  Cardiovascular: negative  Gastrointestinal: negative  Genitourinary: negative  Integument/breast: negative  Hematologic/lymphatic: negative  Musculoskeletal: negative  Neurological: negative  Behavioral/Psych: negative  Endocrine: negative  Allergic/Immunologic: negative    PHYSICAL EXAM:  BP 121/83 (BP Site: Right arm, Patient Position: Sitting, Cuff Size: Large)    Pulse 87    Temp 99.4 F (37.4 C) (Oral)    Resp 20    Ht 1.753 m (5\' 9" )    Wt 137.6 kg (303 lb 4.8 oz)    SpO2 98%    BMI 44.79 kg/m         Performance Status:  0 - Fully active, able to carry on all pre-disease performance without restriction         General Appearance: Alert, cooperative, no distress, appears stated age  Neck: Supple, symmetrical, trachea midline, no adenopathy;    thyroid:  no enlargement/tenderness/nodules  Back: Symmetric, no curvature, ROM normal, no CVA tenderness  Lungs: Clear to auscultation bilaterally, respirations unlabored  Chest Wall: No tenderness or deformity  Heart: Regular rate and rhythm, S1 and S2 normal, no murmur, rub or gallop  Breast Exam: Normal breast exam  Abdomen: Soft, non-tender, bowel sounds active all four quadrants, no masses, no organomegaly  Extremities: Extremities normal, atraumatic, no cyanosis or edema  Skin:  Skin color, texture, turgor normal, no rashes or lesions  Lymph Nodes:  Cervical, supraclavicular, and axillary nodes normal      Labs Results:  Reviewed in epic today  Lab Results   Component Value Date    WBC 10.13 (H) 03/29/2019    HGB 12.7 03/29/2019    HCT 39.5 03/29/2019    MCV 86.8 03/29/2019    PLT 192 03/29/2019     Lab Results   Component Value Date    CREAT 1.2 03/29/2019    BUN 15.8 03/29/2019    NA 137 03/29/2019    K 4.0 03/29/2019    CL 103 03/29/2019    CO2 23 03/29/2019     Lab Results   Component Value Date    ALT 14 03/29/2019    AST 14 03/29/2019    ALKPHOS 80 03/29/2019    BILITOTAL 0.3 03/29/2019       Imaging Results: reviewed mammogram, ultrasound breast     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     I discussed with the patient chemotherapy with dd AC x 4 followed by paclitaxel with carboplatin based on superior pathologic responses when carboplatin is added to AC/taxol. There are 2 large randomized studies, CALGB 16109 and GeparSixto showing a significantly higher pCR with addition of carbo to AC-->T from 41% to 54% and 47% to 64%. Additionally, the GeparSixto trial showed a 10% absolute improvement in [facility].     --- dd AC followed by weekly taxol/carbo: Doxorubicin 60  mg/m2 IV day 1 + Cytoxan 600 mg/m2 IV Day 1 q 14 days x 4 cycles with Neulasta 6 mg Day 1 with each cycle followed by paclitaxel 80 mg/m2 + Carbo AUC  1.5 weekly x 12 weeks.       Chemotherapy treatment was discussed with the patient. The patient was informed of possible side effects that could include nausea, vomiting, diarrhea, constipation, stomatitis, cardiac toxicity, renal toxicity, neuropathy, alopecia, fatigue, and pancytopenia, MDS/leukemia.       CBC, CMP, hepatitis B, PT, PTT, BNP, troponin.   Order baseline ECHO and schedule IPORT placement and chemo teaching.   Referral to cardio-oncology.   Discussed cryotherapy to prevent CIPN and digni caps to prevent chemotherapy induced alopecia.     Will order US breast after AC to assess response as lump not palpable.   IMED consent created. Patient will sign at time of first infusion.      Chemo to start within 10 days from today. Patient prefers FO location.     RTC in 3 weeks in FO           Orders Placed This Encounter   Procedures    COVID-19  Pre-Surgical & Pre-Procedure    CBC and differential    Comprehensive metabolic panel    PT/APTT    B-type Natriuretic Peptide    Troponin I    Pregnancy, urine    Hepatitis B (HBV) Surface Antibody Quant    Hepatitis B core antibody, total    Referral to Hematology / Oncology - [place] Specialized Programs: Cardio-Oncology    Echo Adult TTE Comp 2D M-Mode CLR DOP WAV W Myocard Strain    MEDIPORT     Total time spent on encounter  including face to face interaction with patient and non face to face activities including preparing to see the patient (review imaging and pathology records), obtaining and reviewing the separately obtained history, ordering medication, test, procedure, referring the communicated with other healthcare professionals, documenting clinical information sent electronically, independently interpreting results and communicating results with patient, family, caregiver, care conversation on  the date of service was equal to 60 minutes.     [name], MD

## 2019-03-29 NOTE — Progress Notes (Signed)
Went over the Breast MRI results - 4 cm of Biopsy proven cancer at 3:00 on the Lt. Breast. No  additional findings  On both breasts. Patient verbalized understanding. Next visit on 02/16

## 2019-03-29 NOTE — Telephone Encounter (Signed)
Please assist patient with scheduling Cardiology/Oncology appointment.  Referral placed.

## 2019-03-29 NOTE — Telephone Encounter (Signed)
Please see below.

## 2019-03-30 ENCOUNTER — Other Ambulatory Visit: Payer: Self-pay

## 2019-03-30 DIAGNOSIS — Z9189 Other specified personal risk factors, not elsewhere classified: Secondary | ICD-10-CM

## 2019-03-30 DIAGNOSIS — Z5111 Encounter for antineoplastic chemotherapy: Secondary | ICD-10-CM

## 2019-03-30 NOTE — Telephone Encounter (Addendum)
1.Port Placement(scheduled for 04/08/2019 @1pm  FO IR)  2.Echo (scheduled for 2/18/2021FO location)  3.Teaching (schedulded)  4.Follow up(scheduled)  5.COVID testing (schedulded for 04/07/2019 @ [facility])  6.Referral to Cardio-Onc(email sent 2/9)

## 2019-03-30 NOTE — Telephone Encounter (Signed)
It says Leslie [name], that is [name], correct?

## 2019-03-30 NOTE — Progress Notes (Addendum)
Provider:  Dr. 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    Chemo consent signed (Y/N):  N  Teaching packet given (Y/N):  N  Notes:

## 2019-03-30 NOTE — Telephone Encounter (Signed)
Yes, go ahead and schedule port placement that day and her new chemo appointment is 04/11/19 at 9a.m. Please let patient know.  Thank you.

## 2019-03-30 NOTE — Telephone Encounter (Signed)
1.[name] scheduled for follow up in FO  please schedule [name] Leslie [name] access doesn't schedule Leslie for a new start [name].

## 2019-03-30 NOTE — Telephone Encounter (Signed)
Yes that's correct, I will let patient know. thanks

## 2019-03-30 NOTE — Telephone Encounter (Signed)
Pt has already been scheduled for follow up. Pt is a new start patient access does not schedule first chemo RN please assist. Please review encounter dated 03/29/19

## 2019-03-30 NOTE — Telephone Encounter (Signed)
Spoke FO IR next open slot for port placement is on 2/19 same day as patient Chemo stat date, patient states can Chemo date be changed? She only want to go to FO location she states it's closer to her home.

## 2019-03-30 NOTE — Telephone Encounter (Signed)
Patient would like to have Chemo in FO location she called and states she seen it in my chart not scheduled for FO.

## 2019-03-31 NOTE — Progress Notes (Signed)
Patient here for DDAC/Taxol teaching session; patient's mother also present for teaching.  Today's teaching was focused on Dose Dense Adriamycin + Cytoxan (DDAC).      Treatment schedule was reviewed in detail:  Dose Dense Adriamycin + Cytoxan every 2 weeks x 4 cycles with Neulasta support.      Major possible side effects were reviewed including low immune function, nausea, hair loss, fatigue, urine color change, cardiac changes, hemorrhagic cystitis, and bone pain.  Advised to maintain activity level and exercise to minimize fatigue.  The importance of contraception was reinforced.  Written information on each chemotherapy agent provided.  Rationale for ECHO before DDAC was discussed.      When to call the office, home safety, how to avoid infections such as URI, UTI, and port site were discussed in detail.  Patient stated she will purchase a thermometer to monitor temperature and is aware to call with temperature greater than 100.4.  Neulasta OBI was explained; Claritin as supportive medication was reviewed.      Information on food choices with nausea, taste changes and immunocompromised status.  The home medication schedule, including nausea management, was reviewed.  Handout for Oral Care reviewed and provided.      Mediport teaching completed; patient will get Mediport placed 07/07/18.         Prescriptions for Decadron, Zofran, Compazine, and EMLA were e-prescribed to patient's pharmacy.      Patient questions were answered to the best of my ability; patient encouraged to call with any concerns or questions.  Consent reviewed.    Dose Dense Adriamycin + Cytoxan (DDAC or AC)  Home Medication Schedule     Day 1 Day 2 Day 3 Day 4 Day 5 Day 6   Morning Chemo Decadron 8mg  (2 tabs) with breakfast Decadron 8mg  (2 tabs) with breakfast Decadron 8mg  (2 tabs) with breakfast       Claritin   10mg  (1 tab) Claritin   10mg  (1 tab)  Claritin   10mg  (1 tab) Claritin 10mg   Claritin 10mg       Neulasta/ Udenyca (injection)  Decadron 8mg  (2 tabs) with Lunch Decadron 8mg  (2 tabs) with Lunch     Bedtime Olanzapine 2.5mg , 1 tab Olanzapine 2.5mg  (1 tab) Olanzapine 2.5mg  (1 tab) Olanzapine 2.5mg  (1 tab)                                 Home Medication Schedule  ? Zyprexa (olanzapine) 2.5 mg:  Take at 8pm on Days 1 - 4  ? Decadron (dexamethasome):  8 mg (2 tabs) with breakfast on Day 2  ? Decadron:  8 mg (2 tabs) with breakfast and lunch on Days 3 - 4  ? Claritin: 10mg  (1 tab) recommended in morning Days 2 - 6    Nausea:  Zofran (ondansetron) 8 mg tab (one tab):  May be taken every 8 hours as needed for nausea   Most common side effects:  Headache, constipation, diarrhea.  If nausea is not relieved with Zofran in 1.5 hours, please take Compazine.      Compazine (prochlorperazine) 5 mg (one tab):  May be taken every 6 hours as needed for nausea.     Most common side effects:  drowsiness, difficulty sleeping, dizziness, constipation, feeling nervous or excitable   Do not take Compazine at same time as Zyprexa (olanzapine).    Constipation:  Miralax 17gm:  Take as needed.    Other:  EMLA Cream:  Apply to Mediport 1 hour prior to chemotherapy labs.  Cover with plastic wrap.

## 2019-04-01 ENCOUNTER — Encounter: Payer: Self-pay | Admitting: Internal Medicine

## 2019-04-01 ENCOUNTER — Telehealth (INDEPENDENT_AMBULATORY_CARE_PROVIDER_SITE_OTHER): Payer: Self-pay

## 2019-04-01 DIAGNOSIS — Z5111 Encounter for antineoplastic chemotherapy: Secondary | ICD-10-CM | POA: Insufficient documentation

## 2019-04-01 NOTE — Telephone Encounter (Signed)
Patient scheduled for Cardio-Onc(04/07/2019)

## 2019-04-01 NOTE — Telephone Encounter (Signed)
error 

## 2019-04-04 ENCOUNTER — Encounter: Payer: Self-pay | Admitting: Family

## 2019-04-04 ENCOUNTER — Ambulatory Visit: Payer: Commercial Managed Care - POS | Attending: Family | Admitting: Family

## 2019-04-04 DIAGNOSIS — C50412 Malignant neoplasm of upper-outer quadrant of left female breast: Secondary | ICD-10-CM

## 2019-04-04 DIAGNOSIS — Z171 Estrogen receptor negative status [ER-]: Secondary | ICD-10-CM

## 2019-04-04 MED ORDER — DEXAMETHASONE 4 MG PO TABS
ORAL_TABLET | ORAL | 0 refills | Status: DC
Start: 2019-04-04 — End: 2019-11-28

## 2019-04-04 MED ORDER — PROCHLORPERAZINE MALEATE 10 MG PO TABS
10.0000 mg | ORAL_TABLET | Freq: Four times a day (QID) | ORAL | 3 refills | Status: DC | PRN
Start: 2019-04-04 — End: 2019-11-28

## 2019-04-04 MED ORDER — ONDANSETRON 8 MG PO TBDP
8.00 mg | ORAL_TABLET | Freq: Three times a day (TID) | ORAL | 3 refills | Status: DC | PRN
Start: 2019-04-04 — End: 2019-11-28

## 2019-04-04 MED ORDER — LIDOCAINE-PRILOCAINE 2.5-2.5 % EX CREA
TOPICAL_CREAM | CUTANEOUS | 3 refills | Status: DC | PRN
Start: 2019-04-04 — End: 2019-06-29

## 2019-04-04 MED ORDER — OLANZAPINE 2.5 MG PO TABS
ORAL_TABLET | ORAL | 0 refills | Status: DC
Start: 2019-04-04 — End: 2019-06-22

## 2019-04-04 NOTE — Progress Notes (Signed)
Leslie [name] is working on this.

## 2019-04-05 ENCOUNTER — Ambulatory Visit (INDEPENDENT_AMBULATORY_CARE_PROVIDER_SITE_OTHER): Payer: Commercial Managed Care - POS | Admitting: Surgery

## 2019-04-05 ENCOUNTER — Encounter (HOSPITAL_BASED_OUTPATIENT_CLINIC_OR_DEPARTMENT_OTHER): Payer: Self-pay | Admitting: Surgery

## 2019-04-05 VITALS — BP 126/82 | HR 105 | Temp 97.5°F | Ht 69.0 in | Wt 300.2 lb

## 2019-04-05 DIAGNOSIS — C50912 Malignant neoplasm of unspecified site of left female breast: Secondary | ICD-10-CM

## 2019-04-05 NOTE — Progress Notes (Signed)
Follow Up Note - Breast Surgery    CC: LEFT breast IDC ER/PR/HER2 negative    History of Present Illness:   Leslie [name] is a 49 y.o. 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 She met with medical oncology and is set to start neoadjuvant chemotherapy on Monday.     Patient had a BILATERAL mammogram on Jan 2021 and noted LEFT breast 3.1 x 2cm hypoechoic mass and mildly enlarged axillary node abnormality. She underwent a CNB of the LEFT breast 2:00 mass and node- LEFT breast mass was IDC ER/PR/HER2 negative , node was benign.      Rad/Path: [facility]  03/27/2019- MRI BREAST BILATERAL WITHOUT AND WITH CONTRAST    HISTORY: New diagnosis of left breast cancer at 2:00 position. Benign left  axillary lymph node biopsy.    TECHNIQUE: Sagittal T2-weighted, sagittal pre and dynamic postcontrast  VIBRANT and axial post contrast VIBRANT images of both breasts using a  dedicated breast coil. Subtraction images were generated from the pre- and  postcontrast data set and 3D images were created and reviewed separately on  an independent workstation. Computer aided detection applied.   Contrast: Gadavist 10 cc        COMPARISON: No images available. Outside mammogram and ultrasound report  this was biopsied reports available from 03/07/2019, 02/25/2019,  02/16/2019.    FINDINGS:  Amount of glandular tissue: There is scattered fibroglandular tissue.  Background parenchymal enhancement:  Mild.    Left breast: In the lateral breast approximately 3:00 position, there is a  heterogeneously enhancing lobulated mass containing biopsy clip measuring  approximately 4.0 x 1.9 x 1.9 cm in size. This enhancing mass presumably  corresponds with biopsy-proven cancer from outside facility labeled as 2:00  position. There is no additional suspicious finding noted.    Right breast: Scattered foci of enhancement without suspicious mass or area  of enhancement seen.    No bulky lymphadenopathy noted of the bilateral  axillary and internal  mammary region. A left axillary lymph node was biopsied at outside facility  with negative results.    IMPRESSION:     Biopsy-proven cancer of the left lateral breast approximately 3:00 position  on MRI. This mass probably corresponds with mass described on prior outside  ultrasound report as 2:00 position. No additional suspicious finding.    BIRADS CATEGORY 06-KNOWN BIOPSY PROVEN MALIGNANCY      Electronically signed by: Sallye Lat M.D.  [Interpreted at: 41BCF]  [facility]      Obstetric History  OB History   Gravida Para Term Preterm AB Living   3 3 3  0 0 2   SAB TAB Ectopic Multiple Live Births   0 0 0 0 2       Past Medical History:   Diagnosis Date    Fibroid     Herpes simplex without mention of complication 2003    possible recurrance in 2013    Hypertension     Lymphedema of extremity 11/03/2017    Neurologic disorder     brachial neuritis    Obesity     Urinary tract infection     Vitamin D deficiency    :    Past Surgical History:   Procedure Laterality Date    HYSTERECTOMY  2008    fibroids, readmitted for pelvic abscess formation POD#3    OTHER SURGICAL HISTORY  05/04/2015    Obalon balloon - have 3 balloons placed in stomach for weight  loss, Dr. Georgann Housekeeper    SALPINGOOPHORECTOMY Right 2008    RSO with hysterectomy    TUBAL LIGATION  02/17/1997   :      Current Outpatient Medications:     dexamethasone (DECADRON) 4 MG tablet, Take 8 mg (2 tabs) on days 2, 3, and 4 for breakfast and days 3 and 4 for lunch of each chemotherapy cycle., Disp: 40 tablet, Rfl: 0    ergocalciferol (ERGOCALCIFEROL) 1.25 MG (50000 UT) capsule, Take 1 capsule (50,000 Units total) by mouth once a week, Disp: 12 capsule, Rfl: 1    lidocaine-prilocaine (EMLA) cream, Apply topically as needed (one hour prior to chemotherapy appointment), Disp: 30 g, Rfl: 3    losartan-hydrochlorothiazide (HYZAAR) 100-25 MG per tablet, Take 1 tablet by mouth daily, Disp: 30 tablet,  Rfl: 1    OLANZapine (ZyPREXA) 2.5 MG tablet, Take one tab at bedtime days 1,2,3, and 4 of each chemotherapy cycle, Disp: 16 tablet, Rfl: 0    ondansetron (Zofran ODT) 8 MG disintegrating tablet, Take 1 tablet (8 mg total) by mouth every 8 (eight) hours as needed for Nausea, Disp: 60 tablet, Rfl: 3    prochlorperazine (COMPAZINE) 10 MG tablet, Take 1 tablet (10 mg total) by mouth every 6 (six) hours as needed (nausea), Disp: 30 tablet, Rfl: 3    tiZANidine (Zanaflex) 4 MG tablet, One tab every 8 hrs as needed sparingly to relax muscles, Disp: 30 tablet, Rfl: 0    Allergies:  Valsartan and Penicillins    Family History:  Her family history includes Breast cancer in her maternal aunt and another family member; Coronary artery disease in her mother; Diabetes in her father; Heart disease in her mother; Hyperlipidemia in her mother; Hypertension in her father, mother, and sister; Other in her father; Uterine cancer in her maternal aunt; Vaginal cancer in her maternal aunt.    Social History:  Social History     Socioeconomic History    Marital status: Legally Separated     Spouse name: None    Number of children: None    Years of education: None    Highest education level: None   Occupational History    None   Social Engineer, site strain: None    Food insecurity     Worry: None     Inability: None    Transportation needs     Medical: None     Non-medical: None   Tobacco Use    Smoking status: Never Smoker    Smokeless tobacco: Never Used   Substance and Sexual Activity    Alcohol use: Yes     Comment: wine - social, 2-4 times a month    Drug use: No    Sexual activity: Yes     Partners: Male   Lifestyle    Physical activity     Days per week: 7 days     Minutes per session: 20 min    Stress: None   Relationships    Social Wellsite geologist on phone: None     Gets together: None     Attends religious service: None     Active member of club or organization: None     Attends meetings of  clubs or organizations: None     Relationship status: None    Intimate partner violence     Fear of current or ex partner: None     Emotionally abused: None  Physically abused: None     Forced sexual activity: None   Other Topics Concern    None   Social History Narrative    Has elliptical machine        Lives with boyfriend, daughter home        Son died on 02/09/19 in CCU       Review of Systems  Review of Systems   Constitutional: Negative.    HENT: Negative.    Eyes: Negative.    Respiratory: Negative.    Cardiovascular: Negative.    Gastrointestinal: Negative.    Endocrine: Negative.    Genitourinary: Negative.    Musculoskeletal: Negative.    Skin: Negative.    Allergic/Immunologic: Negative.    Neurological: Negative.    Hematological: Negative.    Psychiatric/Behavioral: Negative.        Physical Exam  BP 126/82    Pulse (!) 105    Temp 97.5 F (36.4 C) (Temporal)    Ht 1.753 m (5\' 9" )    Wt 136.2 kg (300 lb 3.2 oz)    BMI 44.33 kg/m   Physical Exam   Constitutional: She is oriented to person, place, and time and well-developed, well-nourished, and in no distress.   HENT:   Head: Normocephalic and atraumatic.   Eyes: Conjunctivae are normal.   Neck: Normal range of motion.   Abdominal: Soft.   Neurological: She is alert and oriented to person, place, and time. Gait normal.   Skin: Skin is warm.   Psychiatric: Mood, memory, affect and judgment normal.     .       Assessment: 49 y.o. 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 Clinically Stage II (cT2N0M0).      Plan:   Start NAC.   Follow up with me in 2 months   Call prior if any issues      Dairl Ponder. Tye Savoy, MD    Dairl Ponder. Tye Savoy MD  Surgeon, [facility]  [facility] Department of Surgery    04/05/2019 10:03 AM        CC List :  No Recipients

## 2019-04-05 NOTE — Telephone Encounter (Signed)
Okey Regal form cardio oncology called and state pt will be seen by Dr. Carlyn Reichert on 02/19

## 2019-04-06 ENCOUNTER — Telehealth: Payer: Self-pay | Admitting: Internal Medicine

## 2019-04-06 ENCOUNTER — Other Ambulatory Visit: Payer: Self-pay | Admitting: Internal Medicine

## 2019-04-06 DIAGNOSIS — C50412 Malignant neoplasm of upper-outer quadrant of left female breast: Secondary | ICD-10-CM

## 2019-04-06 DIAGNOSIS — Z5111 Encounter for antineoplastic chemotherapy: Secondary | ICD-10-CM

## 2019-04-06 DIAGNOSIS — Z171 Estrogen receptor negative status [ER-]: Secondary | ICD-10-CM

## 2019-04-06 NOTE — Telephone Encounter (Signed)
Ordered and linked.

## 2019-04-06 NOTE — Telephone Encounter (Signed)
Pt wants to get labs done 2/18 after her covid test.  She also states she didn't get the urine pregnancy test done while she was here.  Does she need that before chemo as well?  I don't see an Hep B order to link to the lab appt for tomorrow; please assist.

## 2019-04-06 NOTE — Telephone Encounter (Signed)
Please assist patient with making a lab appointment, ASAP.  Thank you.

## 2019-04-07 ENCOUNTER — Ambulatory Visit: Payer: Commercial Managed Care - POS

## 2019-04-07 ENCOUNTER — Other Ambulatory Visit: Payer: Commercial Managed Care - POS

## 2019-04-08 ENCOUNTER — Ambulatory Visit: Payer: Commercial Managed Care - POS

## 2019-04-08 ENCOUNTER — Telehealth (INDEPENDENT_AMBULATORY_CARE_PROVIDER_SITE_OTHER): Payer: Commercial Managed Care - POS | Admitting: Cardiovascular Disease

## 2019-04-08 ENCOUNTER — Ambulatory Visit
Admission: RE | Admit: 2019-04-08 | Discharge: 2019-04-08 | Disposition: A | Discharge status: Home / Self Care | Payer: Commercial Managed Care - POS | Source: Ambulatory Visit | Attending: Interventional Radiology and Diagnostic Radiology | Admitting: Interventional Radiology and Diagnostic Radiology

## 2019-04-08 ENCOUNTER — Encounter
Admission: RE | Disposition: A | Discharge status: Home / Self Care | Payer: Self-pay | Source: Ambulatory Visit | Attending: Interventional Radiology and Diagnostic Radiology

## 2019-04-08 ENCOUNTER — Telehealth: Payer: Self-pay

## 2019-04-08 ENCOUNTER — Other Ambulatory Visit (INDEPENDENT_AMBULATORY_CARE_PROVIDER_SITE_OTHER): Payer: Self-pay

## 2019-04-08 ENCOUNTER — Ambulatory Visit (HOSPITAL_BASED_OUTPATIENT_CLINIC_OR_DEPARTMENT_OTHER)
Admission: RE | Admit: 2019-04-08 | Discharge: 2019-04-08 | Disposition: A | Discharge status: Home / Self Care | Payer: Commercial Managed Care - POS | Source: Ambulatory Visit | Attending: Interventional Radiology and Diagnostic Radiology | Admitting: Interventional Radiology and Diagnostic Radiology

## 2019-04-08 ENCOUNTER — Other Ambulatory Visit: Payer: Self-pay | Admitting: Internal Medicine

## 2019-04-08 DIAGNOSIS — C50919 Malignant neoplasm of unspecified site of unspecified female breast: Secondary | ICD-10-CM

## 2019-04-08 DIAGNOSIS — I10 Essential (primary) hypertension: Secondary | ICD-10-CM

## 2019-04-08 DIAGNOSIS — M5412 Radiculopathy, cervical region: Secondary | ICD-10-CM | POA: Insufficient documentation

## 2019-04-08 DIAGNOSIS — E559 Vitamin D deficiency, unspecified: Secondary | ICD-10-CM | POA: Insufficient documentation

## 2019-04-08 DIAGNOSIS — C50912 Malignant neoplasm of unspecified site of left female breast: Secondary | ICD-10-CM | POA: Insufficient documentation

## 2019-04-08 HISTORY — PX: MEDIPORT PLACEMENT: IMG2599

## 2019-04-08 LAB — HEPATITIS B SURFACE ANTIBODY: HEPATITIS B SURFACE ANTIBODY: <8

## 2019-04-08 LAB — COVID-19 (SARS-COV-2): SARS CoV 2 Overall Result: NEGATIVE

## 2019-04-08 LAB — HEMOLYSIS INDEX: Hemolysis Index: 8 (ref 0–18)

## 2019-04-08 SURGERY — MEDIPORT PLACEMENT
Anesthesia: Conscious Sedation

## 2019-04-08 MED ORDER — ACETAMINOPHEN 325 MG PO TABS
650.0000 mg | ORAL_TABLET | Freq: Once | ORAL | Status: AC
Start: 2019-04-08 — End: 2019-04-08
  Administered 2019-04-08: 15:00:00 650 mg via ORAL

## 2019-04-08 MED ORDER — MIDAZOLAM HCL 1 MG/ML IJ SOLN (WRAP)
INTRAMUSCULAR | Status: AC | PRN
Start: 2019-04-08 — End: 2019-04-08
  Administered 2019-04-08: 0.5 mg via INTRAVENOUS

## 2019-04-08 MED ORDER — FENTANYL CITRATE (PF) 50 MCG/ML IJ SOLN (WRAP)
INTRAMUSCULAR | Status: AC | PRN
Start: 2019-04-08 — End: 2019-04-08
  Administered 2019-04-08: 50 ug via INTRAVENOUS

## 2019-04-08 MED ORDER — ONDANSETRON HCL 4 MG/2ML IJ SOLN
INTRAMUSCULAR | Status: AC | PRN
Start: 2019-04-08 — End: 2019-04-08
  Administered 2019-04-08: 4 mg via INTRAVENOUS

## 2019-04-08 MED ORDER — FENTANYL CITRATE (PF) 50 MCG/ML IJ SOLN (WRAP)
INTRAMUSCULAR | Status: AC
Start: 2019-04-08 — End: ?
  Filled 2019-04-08: qty 4

## 2019-04-08 MED ORDER — DIPHENHYDRAMINE HCL 50 MG/ML IJ SOLN
INTRAMUSCULAR | Status: AC | PRN
Start: 2019-04-08 — End: 2019-04-08
  Administered 2019-04-08: 25 mg via INTRAVENOUS

## 2019-04-08 MED ORDER — FENTANYL CITRATE (PF) 50 MCG/ML IJ SOLN (WRAP)
INTRAMUSCULAR | Status: AC | PRN
Start: 2019-04-08 — End: 2019-04-08
  Administered 2019-04-08: 25 ug via INTRAVENOUS

## 2019-04-08 MED ORDER — LIDOCAINE 1% BUFFERED - CNR/OUTSOURCED
INTRAMUSCULAR | Status: AC
Start: 2019-04-08 — End: ?
  Filled 2019-04-08: qty 22

## 2019-04-08 MED ORDER — ACETAMINOPHEN 325 MG PO TABS
ORAL_TABLET | ORAL | Status: AC
Start: 2019-04-08 — End: ?
  Filled 2019-04-08: qty 2

## 2019-04-08 MED ORDER — HEPARIN SOD (PORK) LOCK FLUSH 100 UNIT/ML IV SOLN
INTRAVENOUS | Status: AC
Start: 2019-04-08 — End: ?
  Filled 2019-04-08: qty 5

## 2019-04-08 MED ORDER — SODIUM CHLORIDE 0.9 % IV SOLN
INTRAVENOUS | Status: AC | PRN
Start: 2019-04-08 — End: 2019-04-08
  Administered 2019-04-08: 100 mL/h via INTRAVENOUS

## 2019-04-08 MED ORDER — BUPIVACAINE HCL (PF) 0.25 % IJ SOLN
INTRAMUSCULAR | Status: AC
Start: 2019-04-08 — End: ?
  Filled 2019-04-08: qty 10

## 2019-04-08 MED ORDER — VANCOMYCIN 1000 MG IN 250 ML NS IVPB VIAL-MATE (CNR)
1000.00 mg | Freq: Once | INTRAVENOUS | Status: AC
Start: 2019-04-08 — End: 2019-04-08
  Administered 2019-04-08: 13:00:00 1000 mg via INTRAVENOUS

## 2019-04-08 MED ORDER — VANCOMYCIN 1000 MG IN 250 ML NS IVPB VIAL-MATE (CNR)
INTRAVENOUS | Status: AC
Start: 2019-04-08 — End: ?
  Filled 2019-04-08: qty 250

## 2019-04-08 MED ORDER — MIDAZOLAM HCL 1 MG/ML IJ SOLN (WRAP)
INTRAMUSCULAR | Status: AC
Start: 2019-04-08 — End: ?
  Filled 2019-04-08: qty 4

## 2019-04-08 MED ORDER — MIDAZOLAM HCL 1 MG/ML IJ SOLN (WRAP)
INTRAMUSCULAR | Status: AC | PRN
Start: 2019-04-08 — End: 2019-04-08
  Administered 2019-04-08: 1 mg via INTRAVENOUS

## 2019-04-08 MED ORDER — DIPHENHYDRAMINE HCL 50 MG/ML IJ SOLN
INTRAMUSCULAR | Status: AC
Start: 2019-04-08 — End: ?
  Filled 2019-04-08: qty 1

## 2019-04-08 MED ORDER — ONDANSETRON HCL 4 MG/2ML IJ SOLN
INTRAMUSCULAR | Status: AC
Start: 2019-04-08 — End: ?
  Filled 2019-04-08: qty 2

## 2019-04-08 MED ORDER — LIDOCAINE-EPINEPHRINE 1 %-1:100000 IJ SOLN
INTRAMUSCULAR | Status: AC | PRN
Start: 2019-04-08 — End: 2019-04-08
  Administered 2019-04-08: 1 mL

## 2019-04-08 MED ORDER — LIDOCAINE-EPINEPHRINE 2 %-1:100000 IJ SOLN
INTRAMUSCULAR | Status: AC
Start: 2019-04-08 — End: ?
  Filled 2019-04-08: qty 20

## 2019-04-08 MED ORDER — MIDAZOLAM HCL 1 MG/ML IJ SOLN (WRAP)
INTRAMUSCULAR | Status: AC
Start: 2019-04-08 — End: ?
  Filled 2019-04-08: qty 2

## 2019-04-08 MED ORDER — LIDOCAINE 1% BUFFERED - CNR/OUTSOURCED
INTRAMUSCULAR | Status: AC | PRN
Start: 2019-04-08 — End: 2019-04-08
  Administered 2019-04-08: 10 mL via INTRADERMAL

## 2019-04-08 NOTE — Telephone Encounter (Signed)
Good morning once again,    I spoke to Leslie [name] and rescheduled her echo for Tuesday 2/23 @ 11:15am, per the previous email, it was OK to schedule within 24 hours of her appt on Monday 2/22 .   She said shes not sure how she will feel that day, but took the appt with the hopes shed be able to make it.    Please let me know if theres anything else you need regarding this patient.    Thanks,

## 2019-04-08 NOTE — Telephone Encounter (Signed)
Pt did not pick up so I left a VM notifying her that her chemotherapy apt is scheduled for 04/13/19 at 11am.

## 2019-04-08 NOTE — Discharge Instructions (Signed)
Interventional Radiology  Discharge Instructions for Mediport Placement    Your Mediport was placed today by  Dr. Varma.    The port is designed to provide you reliable long-term venous access.    The port is placed in a small pocket created under your skin.  The pocket is closed with absorbable sutures that do not have to be removed.  The skin is then covered with an adhesive skin glue (Dermabond).    1. DO NOT GET THE INCISION WET FOR 24  HOURS.  Then shower or bathe as usual, but do not submerge the incision in water until completely healed (approximately 7 days).   2. Keep the area clean and dry.   3. Do not scrub the incision. Gently wash with soap and water.  4. After bathing, pat the area dry with a soft towel.  5. It is not necessary to place a bandage over the incision.   6. Do not scratch, rub, or pick at the film. The skin glue film will fall off naturally in 5 to 10 days. Do not place tape directly over the film.  7. Do not apply liquids (such as peroxide), ointments, or creams to the incision while the film is in place.  8. There is a smaller incision at the base of your neck or on the chest where the catheter was inserted into the vein.  Treat this incision the same as the other incision.  9. Most incisions heal without problems. However, an infection sometimes occurs despite proper treatment. Observe for signs of infection:    a. redness, warmth, swelling, drainage, or temperature greater than 101 degrees F.    b. If you suspect infection, call the Interventional Radiologist.    You may experience soreness or mild pain at the site for a couple days.  You may take Acetaminophen (Tylenol) if needed. You may also apply a cool pack.    If you have questions or concerns, please call an Interventional Radiologist:    Contact Numbers:    Regular business hours (8A-5P M-F):  [facility]: [phone]    After hours:  Answering service:  [phone]

## 2019-04-08 NOTE — Progress Notes (Signed)
Patient returned to IR recovery for observation and discharge when stable, she was drowsy and c/o some pain to right neck. Tylenol given with PO fluids and a snack when more awake, no nausea reported. Dressing remained CDI, tylenol helped pain, PIV was removed. Discharge instructions were given and reviewed by RN with patient and her friend Peyton Najjar, questions were answered. Pt reported she has "sippin pain" she reported having some withdrawals from not drinking alcohol and was advised against drinking alcohol x24 hours. Pt was escorted to the lobby in a wheelchair with her belongings.

## 2019-04-08 NOTE — Telephone Encounter (Signed)
Good morning,    I spoke with Ms. Granger yesterday regarding her echo and gave her our central scheduling phone number to call and reschedule at any location.   Unfortunately here at [facility], we do not have any openings before 2/25 for this type of echo, so she would need to look at a different location.    If you cannot get ahold of her, I will call her again and see what I can do.

## 2019-04-08 NOTE — Brief Op Note (Signed)
BRIEF OP NOTE    Date Time: 04/08/19 2:38 PM    Patient Name:   Leslie [name]    Date of Operation:   04/08/2019    Providers Performing:   Creola Corn. Sallee Lange, MD  Bayard Beaver, PA-C    Operative Procedure:   Mediport Insertion    Preoperative Diagnosis:   Pre-Op Diagnosis Codes:     * Essential hypertension [I10]     * Cervical radiculopathy [M54.12]     * Morbid obesity [E66.01]     * Vitamin D deficiency [E55.9]     * Stage I breast cancer in female [C50.919]    Postoperative Diagnosis:   Same    Anesthesia:   (x) FENTANYL  (x) VERSED  (x) LOCAL  (  ) GENERAL ANESTHESIA (DEPT OF ANESTHESIOLOGY) )    Estimated Blood Loss:   2cc    Findings:   Widely patent Right IJ  8FR Single lumen implanted power port placed  Tip at SVC/RA junction  Ready for immediate use    Complications:   None.      Signed by: Pollyann Kennedy, MD                                                                                  [facility]   [facility]  Office [phone]   [place] - [phone]   Contact numbers - [facility] - [phone]  [facility] - [phone]  [facility]- [phone]  [facility] [facility] - [phone]  [facility] [phone]  [facility] [phone]

## 2019-04-08 NOTE — H&P (Signed)
BRIEF IR H&P      PROCEDURE   Mediport insertion - RIGHT    HISTORY   Pre-Op Diagnosis Codes:     * Essential hypertension [I10]     * Cervical radiculopathy [M54.12]     * Morbid obesity [E66.01]     * Vitamin D deficiency [E55.9]     * Stage I breast cancer in female [C50.919]  For chemotherapy    No current facility-administered medications on file prior to encounter.      Current Outpatient Medications on File Prior to Encounter   Medication Sig Dispense Refill    losartan-hydrochlorothiazide (HYZAAR) 100-25 MG per tablet Take 1 tablet by mouth daily 30 tablet 1    ergocalciferol (ERGOCALCIFEROL) 1.25 MG (50000 UT) capsule Take 1 capsule (50,000 Units total) by mouth once a week 12 capsule 1    tiZANidine (Zanaflex) 4 MG tablet One tab every 8 hrs as needed sparingly to relax muscles 30 tablet 0       ALLERGIES:     Allergies   Allergen Reactions    Valsartan Other (See Comments)     achiness    Penicillins Hives     Symptoms occurred as child  Onset date: 07-28-2004       REVIEW OF SYSTEMS:   YES  (x)       PHYSICAL EXAM     Visit Vitals  BP 138/77   Pulse 84   Ht 1.753 m (5\' 9" )   Wt 136.1 kg (300 lb)   SpO2 99%   BMI 44.30 kg/m       CARDIAC :   (x)  RRR  (  )  IRREG  (  )  TACHY  (  )  MURMUR      LUNGS:   (x)  CLEAR  (  )  DIMINISHED    (  ) LEFT   (  )  RIGHT  (  )  ABSENT          (  ) LEFT   (  )  RIGHT  (  )  TUBES            (  ) LEFT   (  )  RIGHT          ABDOMEN:   Soft  NTND    AIRWAY CLASSIFICATION:  CLASS I   (  )     CLASS II  (x  )    CLASS III  (  )     CLASS IV  (  )      ASA PHYSICAL STATUS   (  )  ASA 1   HEALTHY PATIENT  (  )  ASA 2   MILD SYSTEMIC ILLNESS  (x)  ASA 3   SYSTEMIC DISEASE, NOT INCAPACITATING  (  )  ASA 4   SEVERE SYSTEMIC DISEASE, DISEASE IS CONSTANT THREAT TO                         LIFE  (  )  ASA 5   MORIBUND CONDITION, NOT EXPECTED TO LIVE >24 HOURS            IRRESPECTIVE OF PROCEDURE  (  )  E           EMERGENCY PROCEDURE       PLANNED SEDATION:   (  ) NO  SEDATION  (x) MODERATE SEDATION  (  )  DEEP SEDATION WITH ANESTHESIA      CONCLUSION:   PATIENT HAS BEEN REASSESSED IMMEDIATELY PRIOR TO THE PROCEDURE   AND IS AN APPROPRIATE CANDIDATE FOR THE PLANNED SEDATION AND   PROCEDURE.  RISKS, BENEFITS AND ALTERNATIVES TO THE PLANNED   PROCEDURE AND SEDATION HAVE BEEN EXPLAINED TO THE PATIENT   OR GUARDIAN.    (x)  YES  (  )  EMERGENCY CONSENT         Signed by Andee Lineman, MD    [facility]   [facility]  Office - [phone]   [place] - [phone]   Contact numbers - [facility] - [phone]  [place] - [phone]  [facility]- [phone]  [place] - [phone]  [place] [phone]  [place] [phone]

## 2019-04-08 NOTE — Procedures (Signed)
Patient came to IR department for right chest mediport placement, consent signed with Dr. Sallee Lange. Patient positioned on table supine, and placed on cardiac monitor and supplemental oxygen. Patient was sterile draped and prepped by IR Tech.     Patient tolerated procedure well. [place] and xray utilized for procedure to verify placement and anatomy. Patient vital signs stable on monitor, pain controlled with local anesthetic and IV sedation medication.     Dressing placed to right chest, absorbable sutures and dermabond. No bleeding of oozing noted from site. Report called to [facility] RN.     Patient transported back to IR department with all belongings.   Patient denies any other complaints. Post procedure instructions added to chart.

## 2019-04-11 ENCOUNTER — Encounter: Payer: Self-pay | Admitting: Interventional Radiology and Diagnostic Radiology

## 2019-04-11 ENCOUNTER — Encounter: Payer: Self-pay | Admitting: Internal Medicine

## 2019-04-11 ENCOUNTER — Ambulatory Visit: Payer: Commercial Managed Care - POS

## 2019-04-11 DIAGNOSIS — Z9189 Other specified personal risk factors, not elsewhere classified: Secondary | ICD-10-CM | POA: Insufficient documentation

## 2019-04-11 LAB — HEPATITIS B DNA ULTRAQUANTITATIVE, PCR: HBV DNA QN PCR (IU/mL): NOT DETECTED

## 2019-04-11 NOTE — Progress Notes (Signed)
I received the authorization from patients insurance. Okay to proceed with treatment.     Due to the insurance's medical policy, Pegfilgrastim (Neulasta) was only approved for 1 visit initially.     I will need to follow up with [employer] before second treatment to have the authorization extended to cover all treatments. Francee Piccolo Vera/[employer])

## 2019-04-12 ENCOUNTER — Ambulatory Visit
Admission: RE | Admit: 2019-04-12 | Discharge: 2019-04-12 | Disposition: A | Discharge status: Home / Self Care | Payer: Commercial Managed Care - POS | Source: Ambulatory Visit | Attending: Internal Medicine | Admitting: Internal Medicine

## 2019-04-12 ENCOUNTER — Other Ambulatory Visit: Payer: Self-pay | Admitting: Internal Medicine

## 2019-04-12 DIAGNOSIS — M5412 Radiculopathy, cervical region: Secondary | ICD-10-CM | POA: Insufficient documentation

## 2019-04-12 DIAGNOSIS — Z170 Estrogen receptor positive status [ER+]: Secondary | ICD-10-CM | POA: Insufficient documentation

## 2019-04-12 DIAGNOSIS — I890 Lymphedema, not elsewhere classified: Secondary | ICD-10-CM | POA: Insufficient documentation

## 2019-04-12 DIAGNOSIS — C50912 Malignant neoplasm of unspecified site of left female breast: Secondary | ICD-10-CM | POA: Insufficient documentation

## 2019-04-12 DIAGNOSIS — I10 Essential (primary) hypertension: Secondary | ICD-10-CM | POA: Insufficient documentation

## 2019-04-12 DIAGNOSIS — E559 Vitamin D deficiency, unspecified: Secondary | ICD-10-CM | POA: Insufficient documentation

## 2019-04-13 ENCOUNTER — Other Ambulatory Visit: Payer: Self-pay | Admitting: Internal Medicine

## 2019-04-13 DIAGNOSIS — Z171 Estrogen receptor negative status [ER-]: Secondary | ICD-10-CM

## 2019-04-13 DIAGNOSIS — Z5111 Encounter for antineoplastic chemotherapy: Secondary | ICD-10-CM

## 2019-04-14 ENCOUNTER — Ambulatory Visit (INDEPENDENT_AMBULATORY_CARE_PROVIDER_SITE_OTHER): Payer: Self-pay | Admitting: Residents

## 2019-04-14 ENCOUNTER — Ambulatory Visit: Payer: Commercial Managed Care - POS | Attending: Internal Medicine

## 2019-04-14 VITALS — BP 138/85 | HR 100 | Temp 97.8°F | Resp 18 | Ht 69.02 in | Wt 306.2 lb

## 2019-04-14 DIAGNOSIS — B009 Herpesviral infection, unspecified: Secondary | ICD-10-CM

## 2019-04-14 DIAGNOSIS — Z5111 Encounter for antineoplastic chemotherapy: Secondary | ICD-10-CM | POA: Insufficient documentation

## 2019-04-14 DIAGNOSIS — Z5112 Encounter for antineoplastic immunotherapy: Secondary | ICD-10-CM | POA: Insufficient documentation

## 2019-04-14 DIAGNOSIS — Z8249 Family history of ischemic heart disease and other diseases of the circulatory system: Secondary | ICD-10-CM | POA: Insufficient documentation

## 2019-04-14 DIAGNOSIS — C50412 Malignant neoplasm of upper-outer quadrant of left female breast: Secondary | ICD-10-CM | POA: Insufficient documentation

## 2019-04-14 DIAGNOSIS — Z171 Estrogen receptor negative status [ER-]: Secondary | ICD-10-CM | POA: Insufficient documentation

## 2019-04-14 DIAGNOSIS — C50419 Malignant neoplasm of upper-outer quadrant of unspecified female breast: Secondary | ICD-10-CM

## 2019-04-14 LAB — CBC AND DIFFERENTIAL
Absolute NRBC: 0 10*3/uL (ref 0.00–0.00)
Basophils Absolute Automated: 0.03 10*3/uL (ref 0.00–0.08)
Basophils Automated: 0.4 %
Eosinophils Absolute Automated: 0.12 10*3/uL (ref 0.00–0.44)
Eosinophils Automated: 1.5 %
Hematocrit: 36.1 % (ref 34.7–43.7)
Hgb: 12 g/dL (ref 11.4–14.8)
Immature Granulocytes Absolute: 0.02 10*3/uL (ref 0.00–0.07)
Immature Granulocytes: 0.2 %
Lymphocytes Absolute Automated: 2.24 10*3/uL (ref 0.42–3.22)
Lymphocytes Automated: 27.8 %
MCH: 28.7 pg (ref 25.1–33.5)
MCHC: 33.2 g/dL (ref 31.5–35.8)
MCV: 86.4 fL (ref 78.0–96.0)
MPV: 10.2 fL (ref 8.9–12.5)
Monocytes Absolute Automated: 0.6 10*3/uL (ref 0.21–0.85)
Monocytes: 7.5 %
Neutrophils Absolute: 5.04 10*3/uL (ref 1.10–6.33)
Neutrophils: 62.6 %
Nucleated RBC: 0 /100 WBC (ref 0.0–0.0)
Platelets: 186 10*3/uL (ref 142–346)
RBC: 4.18 10*6/uL (ref 3.90–5.10)
RDW: 14 % (ref 11–15)
WBC: 8.05 10*3/uL (ref 3.10–9.50)

## 2019-04-14 LAB — COMPREHENSIVE METABOLIC PANEL
ALT: 13 U/L (ref 0–55)
AST (SGOT): 14 U/L (ref 5–34)
Albumin/Globulin Ratio: 1 (ref 0.9–2.2)
Albumin: 3.6 g/dL (ref 3.5–5.0)
Alkaline Phosphatase: 82 U/L (ref 37–106)
Anion Gap: 9 (ref 5.0–15.0)
BUN: 15 mg/dL (ref 7.0–19.0)
Bilirubin, Total: 0.4 mg/dL (ref 0.2–1.2)
CO2: 25 mEq/L (ref 21–29)
Calcium: 8.9 mg/dL (ref 8.5–10.5)
Chloride: 103 mEq/L (ref 100–111)
Creatinine: 1.1 mg/dL (ref 0.4–1.5)
Globulin: 3.5 g/dL (ref 2.0–3.7)
Glucose: 101 mg/dL — ABNORMAL HIGH (ref 70–100)
Potassium: 3.6 mEq/L (ref 3.5–5.1)
Protein, Total: 7.1 g/dL (ref 6.0–8.3)
Sodium: 137 mEq/L (ref 136–145)

## 2019-04-14 LAB — GFR: EGFR: >60

## 2019-04-14 LAB — HEMOLYSIS INDEX: Hemolysis Index: 8 (ref 0–18)

## 2019-04-14 MED ORDER — VALACYCLOVIR HCL 1 G PO TABS
1000.00 mg | ORAL_TABLET | Freq: Two times a day (BID) | ORAL | 0 refills | Status: AC
Start: 2019-04-14 — End: 2019-04-21

## 2019-04-14 MED ORDER — PALONOSETRON HCL 0.25 MG/5ML IV SOLN
250.00 ug | Freq: Once | INTRAVENOUS | Status: AC
Start: 2019-04-14 — End: 2019-04-14
  Administered 2019-04-14: 11:00:00 0.25 mg via INTRAVENOUS
  Filled 2019-04-14: qty 5

## 2019-04-14 MED ORDER — SODIUM CHLORIDE 0.9 % IV SOLN
60.00 mg/m2 | Freq: Once | INTRAVENOUS | Status: AC
Start: 2019-04-14 — End: 2019-04-14
  Administered 2019-04-14: 12:00:00 155 mg via INTRAVENOUS
  Filled 2019-04-14: qty 77.5

## 2019-04-14 MED ORDER — SODIUM CHLORIDE 0.9 % IV SOLN
1500.00 mg | Freq: Once | INTRAVENOUS | Status: AC
Start: 2019-04-14 — End: 2019-04-14
  Administered 2019-04-14: 13:00:00 1500 mg via INTRAVENOUS
  Filled 2019-04-14: qty 50

## 2019-04-14 MED ORDER — DEXAMETHASONE IVPB 12 MG IN NS 50 ML
12.00 mg | Freq: Once | INTRAVENOUS | Status: AC
Start: 2019-04-14 — End: 2019-04-14
  Administered 2019-04-14: 11:00:00 12 mg via INTRAVENOUS

## 2019-04-14 MED ORDER — PEGFILGRASTIM 6 MG/0.6ML SC SOSY
6.00 mg | PREFILLED_SYRINGE | Freq: Once | SUBCUTANEOUS | Status: AC
Start: 2019-04-14 — End: 2019-04-14
  Administered 2019-04-14: 14:00:00 6 mg via SUBCUTANEOUS
  Filled 2019-04-14: qty 0.6

## 2019-04-14 MED ORDER — APREPITANT 130 MG/18ML IV EMUL
130.00 mg | Freq: Once | INTRAVENOUS | Status: AC
Start: 2019-04-14 — End: 2019-04-14
  Administered 2019-04-14: 11:00:00 130 mg via INTRAVENOUS
  Filled 2019-04-14: qty 18

## 2019-04-14 NOTE — Progress Notes (Signed)
Brief Note:    CC: "herpes on my arm"          Chemotherapy- new start    Leslie [name] with breast cancer presents to the infusion clinic for Cycle 1, Day 1 DDAC    Oncologist: A. Pennisi, MD    Leslie [name] is well-nourished, well-appearing, and not in distress.   I met with the patient to introduce myself and explain my role as NP.  Patient had a teaching session regarding  Her chemotherapy and did not have questions.   We briefly discussed the most common side effects of her chemo and I stressed the importance infection prevention.     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 He typically gets break outs in the same place if she is stressed or with interventions   The area looks like cluster of vesicles about size of a quarter. She has been treated with Valterx before.  She denies rash anywhere else and offers no other concerns or complaints.      Past Medical History:   Diagnosis Date    Fibroid     Herpes simplex without mention of complication 2003    possible recurrance in 2013    Hypertension     Lymphedema of extremity 11/03/2017    Neurologic disorder     brachial neuritis    Obesity     Urinary tract infection     Vitamin D deficiency        Vitals:    04/14/19 1059   BP: 138/85   Pulse: 100   Resp: 18   Temp: 97.8 F (36.6 C)   SpO2: 100%     Physical Exam  Vitals signs reviewed.   Constitutional:       General: She is not in acute distress.     Appearance: Normal appearance.   Eyes:      Extraocular Movements: Extraocular movements intact.      Conjunctiva/sclera: Conjunctivae normal.   Pulmonary:      Effort: Pulmonary effort is normal.   Skin:     General: Skin is warm and dry.      Findings: Rash present. Rash is purpuric and vesicular.          Neurological:      Mental Status: She is alert and oriented to person, place, and time.   Psychiatric:         Mood and Affect: Mood normal.          Judgment: Judgment normal.     Plan:    1. Herpes simplex, recurrence  - Valtrex 100 mg BID x 7 days (sent to patient's preferred pharmacy)  - if continue to recur while on chemo, will prescribe prophylactically    2. Breast cancer  - Proceed with the treatment as planned  - f/u with Dr. Ladell Heads on 3/8    As always, patient was instructed to seek medical care with temperature 100.4 or higher and/or any other concerning S&S. Patient verbalized understanding.    Jovita Gamma, NP

## 2019-04-14 NOTE — Progress Notes (Signed)
[name]   Visit Date: 04/14/2019    D1C1 DDAC   *consent form signed  *ice chips during adriamycin  *all questions answered today regarding treatment     Fontella EDWINA PRESCHER is a 49 y.o. female patient of [name], MD      Overall, she states her energy level is good.  The patient is able to perform most usual functions.. Her appetite is reported to be intact, with no significant changes in weight.. She reports no complaints, today is pts first treatment. She has no other concerns. There are no social work or other referral needs at this time.    Diagnosis: Breast Cancer  Treatment on Clinical Trial: Not On Study (NOS)  [facility](O) - OP Adult - Breast - Dose Dense DOXOrubicin + Cyclophosphamide (DDAC) Q 14 Days x 4 Cycles (Followed by Taxane course)    Pre-Medications: dexamethasone cinvanti and aloxi     Line Type: Mediport,    Right Port  Blood Return verified prior to administration: Yes    Vital Signs:    Patient Vitals for the past 12 hrs:   BP Temp Pulse Resp   04/14/19 1059 138/85 97.8 F (36.6 C) 100 18      Body surface area is 2.6 meters squared.    '  Chemistry        Component Value Date/Time    NA 137 03/29/2019 1729    K 4.0 03/29/2019 1729    CL 103 03/29/2019 1729    CO2 23 03/29/2019 1729    BUN 15.8 03/29/2019 1729    CREAT 1.2 03/29/2019 1729    CREAT 1.05 (H) 02/26/2018 1030    GLU 85 03/29/2019 1729        Component Value Date/Time    CA 9.1 03/29/2019 1729    ALKPHOS 80 03/29/2019 1729    AST 14 03/29/2019 1729    ALT 14 03/29/2019 1729    BILITOTAL 0.3 03/29/2019 1729            Lab Results   Component Value Date    WBC 8.05 04/14/2019    HGB 12.0 04/14/2019    PLT 186 04/14/2019    NEUTROABS 5.04 04/14/2019       Administrations This Visit     aprepitant (CINVANTI) injection 130 mg     Admin Date  04/14/2019  11:26 Action  Given Dose  130 mg Route  Intravenous Ordering Provider  [name], MD          dexAMETHasone (DECADRON) 12 mg in sodium chloride 0.9 % 50 mL  IVPB (premix)     Admin Date  04/14/2019  11:28 Action  New Bag Dose  12 mg Rate  200 mL/hr Route  Intravenous Ordering Provider  [name], MD          DOXOrubicin (ADRIAMYCIN) 155 mg in sodium chloride 0.9 % 362.5 mL chemo infusion     Admin Date  04/14/2019  11:54 Action  New Bag Dose  155 mg Rate  363 mL/hr Route  Intravenous Ordering Provider  [name], MD          palonosetron Joylene John) injection 0.25 mg     Admin Date  04/14/2019  11:25 Action  Given Dose  0.25 mg Route  Intravenous Ordering Provider  [name], MD                Chemo Given within the past 12 hours:   "Chemo/Biotherapy Given" (last  12 hours)     Date/Time Action User Medication Dose Rate Dose Volume (mL) Rate    04/14/19 1403 Given    AD pegfilgrastim (NEULASTA) injection 6 mg 6 mg        04/14/19 1400 Stopped    AD cyclophosphamide (CYTOXAN) 1,500 mg in sodium chloride 0.9 % 360 mL chemo infusion  0 mL/hr       04/14/19 1300 New Bag    AD cyclophosphamide (CYTOXAN) 1,500 mg in sodium chloride 0.9 % 360 mL chemo infusion 1,500 mg 360 mL/hr       04/14/19 1254 Stopped    AD DOXOrubicin (ADRIAMYCIN) 155 mg in sodium chloride 0.9 % 362.5 mL chemo infusion  0 mL/hr       04/14/19 1154 New Bag    AD DOXOrubicin (ADRIAMYCIN) 155 mg in sodium chloride 0.9 % 362.5 mL chemo infusion 155 mg 363 mL/hr               Hypersensitivity Reaction Noted: No  IV Post Hydration: No, not required  Treatment Outcome:Patient tolerated treatment well. Central Line flushed and deacessed. Neulasta OBI put on pts R arm, pt verbalized understanding of when to remove.     Education: Patient/Family educated regarding the expected outcomes/side effects possible interactions of medications. Patient verbalizes understanding.    Follow-up Plan: Discharged in stable condition. Accompanied by, sister. Instructed to contact physician if any complications or unmanageable symptoms occur.    A return to the Infusion Center for the next treatment, has been  scheduled.      Alicia Amel, RN    04/14/2019  2:30 PM

## 2019-04-15 ENCOUNTER — Encounter (INDEPENDENT_AMBULATORY_CARE_PROVIDER_SITE_OTHER): Payer: Self-pay | Admitting: Family Medicine

## 2019-04-18 ENCOUNTER — Telehealth: Payer: Self-pay | Admitting: Internal Medicine

## 2019-04-18 NOTE — Telephone Encounter (Signed)
I completed them last week and sent them to her via MyChart.  I will call her and let her know.  Thank you.

## 2019-04-18 NOTE — Telephone Encounter (Signed)
Spoke with representative from [facility] and they verbally accepted additional information needed to approve patient for the upcoming year.

## 2019-04-18 NOTE — Telephone Encounter (Signed)
patient called and stated FMLA documents need be completed by today. Verbal completion,  please call employer to verbally complete  [phone] .     patient phone #[phone]

## 2019-04-18 NOTE — Telephone Encounter (Signed)
Triage RN received call from [facility] ph# [phone] stating that the patient is showing more absences than what is documented.     -Is asking to review and if so can update on the original form part B, question #6 the frequency and duration. Fax back to # [phone]. Due date 04/28/2019.

## 2019-04-19 ENCOUNTER — Encounter: Payer: Self-pay | Admitting: Internal Medicine

## 2019-04-19 ENCOUNTER — Other Ambulatory Visit: Payer: Self-pay | Admitting: Residents

## 2019-04-19 ENCOUNTER — Ambulatory Visit: Payer: Commercial Managed Care - POS | Admitting: Internal Medicine

## 2019-04-19 DIAGNOSIS — C50912 Malignant neoplasm of unspecified site of left female breast: Secondary | ICD-10-CM

## 2019-04-19 NOTE — Progress Notes (Signed)
10 Slides and 1 block from [facility] submitted to [facility] Pathology Department for Second Opinion.

## 2019-04-20 ENCOUNTER — Ambulatory Visit
Admission: RE | Admit: 2019-04-20 | Discharge: 2019-04-20 | Disposition: A | Discharge status: Home / Self Care | Payer: Commercial Managed Care - POS | Source: Ambulatory Visit | Attending: Surgery | Admitting: Surgery

## 2019-04-20 ENCOUNTER — Telehealth (HOSPITAL_BASED_OUTPATIENT_CLINIC_OR_DEPARTMENT_OTHER): Payer: Self-pay | Admitting: Hematology & Oncology

## 2019-04-20 NOTE — Telephone Encounter (Signed)
Called with constipation and abdominal bloating despite miralax and prune juice. She is passing flatus. No vomiting. Chart reviewed. Recommended the addition of milk of magnesia to her bowel regimen which she will attempt and then call the office tomorrow if symptoms persist.    Janae Bridgeman, MD  Gastrointestinal Oncology  [facility]

## 2019-04-21 ENCOUNTER — Other Ambulatory Visit: Payer: Self-pay | Admitting: Internal Medicine

## 2019-04-21 DIAGNOSIS — E559 Vitamin D deficiency, unspecified: Secondary | ICD-10-CM

## 2019-04-21 DIAGNOSIS — Z9189 Other specified personal risk factors, not elsewhere classified: Secondary | ICD-10-CM

## 2019-04-21 DIAGNOSIS — Z5111 Encounter for antineoplastic chemotherapy: Secondary | ICD-10-CM

## 2019-04-21 MED ORDER — SUCRALFATE 1 G PO TABS
1.00 g | ORAL_TABLET | Freq: Four times a day (QID) | ORAL | 0 refills | Status: AC
Start: 2019-04-21 — End: 2019-05-05

## 2019-04-22 ENCOUNTER — Other Ambulatory Visit: Payer: Self-pay | Admitting: Internal Medicine

## 2019-04-22 DIAGNOSIS — C50412 Malignant neoplasm of upper-outer quadrant of left female breast: Secondary | ICD-10-CM

## 2019-04-22 DIAGNOSIS — Z5111 Encounter for antineoplastic chemotherapy: Secondary | ICD-10-CM

## 2019-04-22 DIAGNOSIS — Z171 Estrogen receptor negative status [ER-]: Secondary | ICD-10-CM

## 2019-04-25 ENCOUNTER — Telehealth: Payer: Self-pay | Admitting: Internal Medicine

## 2019-04-25 ENCOUNTER — Encounter: Payer: Self-pay | Admitting: Internal Medicine

## 2019-04-25 ENCOUNTER — Ambulatory Visit: Payer: Commercial Managed Care - POS | Attending: Internal Medicine | Admitting: Internal Medicine

## 2019-04-25 DIAGNOSIS — C50412 Malignant neoplasm of upper-outer quadrant of left female breast: Secondary | ICD-10-CM

## 2019-04-25 DIAGNOSIS — Z171 Estrogen receptor negative status [ER-]: Secondary | ICD-10-CM

## 2019-04-25 NOTE — Assessment & Plan Note (Signed)
49 yo female with newly diagnosed IDC of the left breast, clinical stage II, cT2, cN0, cMx, G3, ER 0%, PR 0%, Her 2 neu negative (1+) by IHC, Ki 67 80%, currently on neoadjuvant chemotherapy with   --- dd AC followed by weekly taxol/carbo: Doxorubicin 60 mg/m2 IV day 1 + Cytoxan 600 mg/m2 IV Day 1 q 14 days x 4 cycles with Neulasta 6 mg Day 1 with each cycle followed by paclitaxel 80 mg/m2 + Carbo AUC 1.5 weekly x 12 weeks.     C1D1 AC on 04/14/2019. Experienced severe constipation and heartburn.   I do recommend increased hydration + stool softener docusate 100 mg twice daily and use Miralax as needed at the first sign of constipation.   For heartburn, I do recommend Pepcid and use of carafate as needed.     Baseline ECHO 04/12/2019 showed EF 66% and GLS -19%. Referral to cardio-oncology placed.     Will order US breast after AC to assess response as lump not palpable.   IMED consent created. Patient will sign at time of first infusion.      C2D1 C on 04/28/2019.    Return to clinic in 2 weeks.

## 2019-04-25 NOTE — Telephone Encounter (Signed)
Continue AC q 2 weeks    RTC on 3/22 at 1020

## 2019-04-25 NOTE — Progress Notes (Signed)
[facility]  [address] [name]  [phone]      [name] Office  [phone]  Visit Date: 04/25/2019    Chief Complaint   Patient presents with    Follow-up     constipation and heartburn          HISTORY OF PRESENT ILLNESS:  There is chemotherapy she developed a severe constipation (resolved with MiraLAX) and heartburn (see history of GERD improvement of symptoms on Carafate).  Bowel movement returned finally regular last Thursday.     Oncology History Overview Note   Physician Requesting Consult: de Lawson Radar, Dairl Ponder, MD    Breast Surgeon: Orlene Och, MD  Radiation Oncologist:  Plastic Surgeon:   PCP: Satira Mccallum, MD      Leslie [name] is a 49 y.o.-year-old female with newly diagnosed IDC of the left breast.   She presented with abnormal mammogram.     Bilateral screening mammogram 02/2019 showed left breast 3.1 x 2 m hypoechoic mass and midly enlarged axillary node abnormality.     CNB left breast mass 2 OC and left axillary node 03/07/2019 [facility]) showed:   A) Breast: IDC, G3, ER 0%, PR 0%, Her 2 neu negative (1+) by IHC, Ki 67 80%  B) B) Lymph node, left axillary: benign-appearing lymph node.    Bilateral breast MRI and Genetic consult ordered. Path review pending.       Breast Cancer Risk Factors:  Age at Menarche:  6  Age at 28st FTP:  8    Still menstruating?:  No   Last menses: 2008   Hysterectomy: Yes 2008 TAH+ left oophorectomy   Estrogen Therapy:          Oral Contraceptives: No           Hormone Replacement Therapy:  No         Reproductive Assistance Therapies:  No  Family History of Cancer:            Breast:   Yes maternal aunt age 47, maternal cousin age 35, paternal cousin age 58,       Ashkenazi Jewish Ancestry: No     Genetic testing: pending       Denies F/C/N/V/diarrhea. No CP/SOB. No HA/visual changes/focal weakness.        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, estrogen receptor negative     04/14/2019 -  Chemotherapy    cyclophosphamide (CYTOXAN) 1,500 mg in sodium chloride 0.9 % 360 mL chemo infusion, 1,554 mg, Intravenous, Once, 1 of 4 cycles  Administration: 1,500 mg (04/14/2019)  pegfilgrastim (NEULASTA) injection 6 mg, 6 mg, Subcutaneous, Once, 1 of 1 cycle  Administration: 6 mg (04/14/2019)  DOXOrubicin (ADRIAMYCIN) 155 mg in sodium chloride 0.9 % 362.5 mL chemo infusion, 60 mg/m2 = 155 mg, Intravenous, Once, 1 of 4 cycles  Administration: 155 mg (04/14/2019)     06/06/2019 -  Chemotherapy    CARBOplatin (PARAPLATIN) 224.25 mg in sodium chloride 0.9 % 272.43 mL chemo infusion, 224.25 mg, Intravenous, Once, 0 of 4 cycles  PACLitaxel (TAXOL) 210 mg in sodium chloride 0.9 % 285 mL chemo infusion, 80 mg/m2, Intravenous, Once, 0 of 4 cycles       Cancer Staging  No matching staging information was found for the patient.    Past Medical History:  Diagnosis Date    Fibroid     Herpes simplex without mention of complication 2003    possible recurrance in 2013    Hypertension     Lymphedema of extremity 11/03/2017    Neurologic disorder     brachial neuritis    Obesity     Urinary tract infection     Vitamin D deficiency      Past Surgical History:   Procedure Laterality Date    HYSTERECTOMY  2008    fibroids, readmitted for pelvic abscess formation POD#3    MEDIPORT PLACEMENT N/A 04/08/2019    Procedure: MEDIPORT PLACEMENT;  Surgeon: Pollyann Kennedy, MD;  Location: [facility];  Service: Interventional Radiology;  Laterality: N/A;    OTHER SURGICAL HISTORY  05/04/2015    Obalon balloon - have 3 balloons placed in stomach for weight loss, Dr. Georgann Housekeeper    SALPINGOOPHORECTOMY Right 2008    RSO with hysterectomy    TUBAL LIGATION  02/17/1997     Social History     Socioeconomic History    Marital status: Legally Separated     Spouse name: None    Number of children: None    Years of education: None     Highest education level: None   Occupational History    None   Social Engineer, site strain: None    Food insecurity     Worry: None     Inability: None    Transportation needs     Medical: None     Non-medical: None   Tobacco Use    Smoking status: Never Smoker    Smokeless tobacco: Never Used   Substance and Sexual Activity    Alcohol use: Yes     Comment: wine - social, 2-4 times a month    Drug use: No    Sexual activity: Yes     Partners: Male   Lifestyle    Physical activity     Days per week: 7 days     Minutes per session: 20 min    Stress: None   Relationships    Social connections     Talks on phone: None     Gets together: None     Attends religious service: None     Active member of club or organization: None     Attends meetings of clubs or organizations: None     Relationship status: None    Intimate partner violence     Fear of current or ex partner: None     Emotionally abused: None     Physically abused: None     Forced sexual activity: None   Other Topics Concern    None   Social History Narrative    Has elliptical machine        Lives with boyfriend, daughter home        Son died on February 13, 2019 in CCU      Family History   Problem Relation Age of Onset    Hypertension Mother     Coronary artery disease Mother     Heart disease Mother     Hyperlipidemia Mother     Hypertension Father     Diabetes Father     Other Father         cardiac catheterization    Hypertension Sister     Breast cancer Maternal Aunt     Uterine cancer Maternal Aunt     Vaginal cancer  Maternal Aunt     Breast cancer Other         maternal cousin    Ovarian cancer Neg Hx        Allergies   Allergen Reactions    Valsartan Other (See Comments)     achiness    Penicillins Hives     Symptoms occurred as child  Onset date: 07-28-2004     has a current medication list which includes the following prescription(s): dexamethasone, ergocalciferol, lidocaine-prilocaine,  losartan-hydrochlorothiazide, olanzapine, ondansetron, prochlorperazine, sucralfate, and tizanidine.        REVIEW OF SYSTEMS: 14 pt ROS reviewed with the patient and negative except for as documented in the HPI. All other systems reviewed.      PHYSICAL EXAM:  BP 142/78 (BP Site: Right arm, Patient Position: Sitting, Cuff Size: Large)    Pulse 92    Temp (!) 96.6 F (35.9 C) (Temporal)    Resp 20    Ht 1.753 m (5\' 9" )    Wt 137.9 kg (304 lb)    SpO2 98%    BMI 44.89 kg/m     Performance Status:  0 - Fully active, able to carry on all pre-disease performance without restriction         General Appearance: Alert, cooperative, no distress, appears stated age  Head:  Normocephalic, without obvious abnormality, atraumatic  Neck: Supple, symmetrical, trachea midline, no adenopathy;   thyroid:  no enlargement/tenderness/nodules  Back: Symmetric, no curvature, ROM normal, no CVA tenderness  Lungs: Clear to auscultation bilaterally, respirations unlabored  Chest Wall: No tenderness or deformity  Heart: Regular rate and rhythm, S1 and S2 normal, no murmur, rub or gallop  Breast Exam: Normal breast exam  Abdomen: Soft, non-tender, bowel sounds active all four quadrants, no masses, no organomegaly  Extremities: Extremities normal, atraumatic, no cyanosis or edema  Skin:  Skin color, texture, turgor normal, no rashes or lesions    Labs Results:  Reviewed in epic today  Lab Results   Component Value Date    WBC 8.05 04/14/2019    HGB 12.0 04/14/2019    HCT 36.1 04/14/2019    MCV 86.4 04/14/2019    PLT 186 04/14/2019     Lab Results   Component Value Date    CREAT 1.1 04/14/2019    BUN 15.0 04/14/2019    NA 137 04/14/2019    K 3.6 04/14/2019    CL 103 04/14/2019    CO2 25 04/14/2019     Lab Results   Component Value Date    ALT 13 04/14/2019    AST 14 04/14/2019    ALKPHOS 82 04/14/2019    BILITOTAL 0.4 04/14/2019       Imaging Results: reviewed ECHO 04/12/2019     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,  estrogen receptor negative  49 yo female with newly diagnosed IDC of the left breast, clinical stage II, cT2, cN0, cMx, G3, ER 0%, PR 0%, Her 2 neu negative (1+) by IHC, Ki 67 80%, currently on neoadjuvant chemotherapy with   --- dd AC followed by weekly taxol/carbo: Doxorubicin 60 mg/m2 IV day 1 + Cytoxan 600 mg/m2 IV Day 1 q 14 days x 4 cycles with Neulasta 6 mg Day 1 with each cycle followed by paclitaxel 80 mg/m2 + Carbo AUC 1.5 weekly x 12 weeks.     C1D1 AC on 04/14/2019. Experienced severe constipation and heartburn.   I do recommend increased hydration + stool softener docusate  100 mg twice daily and use Miralax as needed at the first sign of constipation.   For heartburn, I do recommend Pepcid and use of carafate as needed.     Baseline ECHO 04/12/2019 showed EF 66% and GLS -19%. Referral to cardio-oncology placed.     Will order US breast after AC to assess response as lump not palpable.   IMED consent created. Patient will sign at time of first infusion.      C2D1 C on 04/28/2019.    Return to clinic in 2 weeks.          [name], MD      LOS Documentation:    Number and Complexity of Problems Addressed:  1 or more chronic illnesses with severe exacerbation, progression, or side effects of treatment    Amount and/or Complexity of Data Reviewed:  - Reviewed the following results from 2021: 3+ unique labs and 1 unique imaging test    Risk of Complications and/or Morbidity or Mortality of Patient Management:  Drug therapy requiring intensive monitoring for toxicity

## 2019-04-28 ENCOUNTER — Other Ambulatory Visit: Payer: Self-pay | Admitting: Internal Medicine

## 2019-04-28 ENCOUNTER — Telehealth (INDEPENDENT_AMBULATORY_CARE_PROVIDER_SITE_OTHER): Payer: Self-pay | Admitting: MS"

## 2019-04-28 ENCOUNTER — Ambulatory Visit: Payer: Commercial Managed Care - POS | Attending: Internal Medicine

## 2019-04-28 VITALS — BP 127/87 | HR 81 | Temp 98.6°F | Resp 20 | Ht 69.02 in | Wt 302.4 lb

## 2019-04-28 DIAGNOSIS — Z171 Estrogen receptor negative status [ER-]: Secondary | ICD-10-CM | POA: Insufficient documentation

## 2019-04-28 DIAGNOSIS — Z5111 Encounter for antineoplastic chemotherapy: Secondary | ICD-10-CM | POA: Insufficient documentation

## 2019-04-28 DIAGNOSIS — C50412 Malignant neoplasm of upper-outer quadrant of left female breast: Secondary | ICD-10-CM | POA: Insufficient documentation

## 2019-04-28 DIAGNOSIS — Z5112 Encounter for antineoplastic immunotherapy: Secondary | ICD-10-CM | POA: Insufficient documentation

## 2019-04-28 LAB — RRL - CMP
ALT Piccolo(R): <20 U/L (ref 10–47)
AST Piccolo(R): <25 U/L (ref 11–38)
Albumin Piccolo(R): 3.6 g/dL (ref 3.3–5.0)
Alkaline Phosphatase Piccolo(R): 92 U/L (ref 42–141)
BUN Piccolo(R): 14 mg/dL (ref 7.0–19.0)
Bilirubin Total Piccolo(R): 0.4 mg/dL (ref 0.2–1.6)
CO2 Piccolo(R): 29 mEq/L (ref 21–29)
Calcium Piccolo(R): 9.6 mg/dL (ref 8.5–10.5)
Chloride Piccolo(R): 102 mEq/L (ref 98–107)
Creatinine Piccolo(R): 1.3 mg/dL (ref 0.4–1.5)
Glucose Piccolo(R): 115 mg/dL — ABNORMAL HIGH (ref 70–100)
Potassium Piccolo(R): 4.1 mEq/L (ref 3.5–5.1)
Protein Piccolo(R): 7.1 g/dL (ref 6.4–8.1)
Sodium Piccolo(R): 138 mEq/L (ref 136–145)

## 2019-04-28 LAB — CBC AND DIFFERENTIAL
Absolute NRBC: 0 10*3/uL (ref 0.00–0.00)
Basophils Absolute Automated: 0.03 10*3/uL (ref 0.00–0.08)
Basophils Automated: 0.2 %
Eosinophils Absolute Automated: 0.03 10*3/uL (ref 0.00–0.44)
Eosinophils Automated: 0.2 %
Hematocrit: 34.5 % — ABNORMAL LOW (ref 34.7–43.7)
Hgb: 11.4 g/dL (ref 11.4–14.8)
Immature Granulocytes Absolute: 0.32 10*3/uL — ABNORMAL HIGH (ref 0.00–0.07)
Immature Granulocytes: 2.7 %
Lymphocytes Absolute Automated: 2.08 10*3/uL (ref 0.42–3.22)
Lymphocytes Automated: 17.3 %
MCH: 28.7 pg (ref 25.1–33.5)
MCHC: 33 g/dL (ref 31.5–35.8)
MCV: 86.9 fL (ref 78.0–96.0)
MPV: 10.4 fL (ref 8.9–12.5)
Monocytes Absolute Automated: 0.59 10*3/uL (ref 0.21–0.85)
Monocytes: 4.9 %
Neutrophils Absolute: 8.97 10*3/uL — ABNORMAL HIGH (ref 1.10–6.33)
Neutrophils: 74.7 %
Nucleated RBC: 0 /100 WBC (ref 0.0–0.0)
Platelets: 130 10*3/uL — ABNORMAL LOW (ref 142–346)
RBC: 3.97 10*6/uL (ref 3.90–5.10)
RDW: 14 % (ref 11–15)
WBC: 12.02 10*3/uL — ABNORMAL HIGH (ref 3.10–9.50)

## 2019-04-28 LAB — GFR: GFR Piccolo(R): 43.7

## 2019-04-28 MED ORDER — DOXORUBICIN HCL 2 MG/ML IV SOLN
60.00 mg/m2 | Freq: Once | INTRAVENOUS | Status: AC
Start: 2019-04-28 — End: 2019-04-28
  Administered 2019-04-28: 12:00:00 155 mg via INTRAVENOUS
  Filled 2019-04-28: qty 77.5

## 2019-04-28 MED ORDER — HEPARIN SOD (PORK) LOCK FLUSH 100 UNIT/ML IV SOLN
5.00 mL | INTRAVENOUS | Status: DC | PRN
Start: 2019-04-28 — End: 2019-04-28
  Administered 2019-04-28: 15:00:00 5 mL
  Filled 2019-04-28: qty 5

## 2019-04-28 MED ORDER — PEGFILGRASTIM 6 MG/0.6ML SC PSKT
6.00 mg | PREFILLED_SYRINGE | Freq: Once | SUBCUTANEOUS | Status: AC
Start: 2019-04-28 — End: 2019-04-28
  Administered 2019-04-28: 15:00:00 6 mg via SUBCUTANEOUS
  Filled 2019-04-28: qty 0.6

## 2019-04-28 MED ORDER — CYCLOPHOSPHAMIDE 500 MG IJ SOLR
1500.00 mg | Freq: Once | INTRAMUSCULAR | Status: AC
Start: 2019-04-28 — End: 2019-04-28
  Administered 2019-04-28: 13:00:00 1500 mg via INTRAVENOUS
  Filled 2019-04-28: qty 50

## 2019-04-28 MED ORDER — APREPITANT 130 MG/18ML IV EMUL
130.00 mg | Freq: Once | INTRAVENOUS | Status: AC
Start: 2019-04-28 — End: 2019-04-28
  Administered 2019-04-28: 12:00:00 130 mg via INTRAVENOUS
  Filled 2019-04-28: qty 18

## 2019-04-28 MED ORDER — PALONOSETRON HCL 0.25 MG/5ML IV SOLN
250.00 ug | Freq: Once | INTRAVENOUS | Status: AC
Start: 2019-04-28 — End: 2019-04-28
  Administered 2019-04-28: 12:00:00 0.25 mg via INTRAVENOUS
  Filled 2019-04-28: qty 5

## 2019-04-28 MED ORDER — DEXAMETHASONE IVPB 12 MG IN NS 50 ML
12.00 mg | Freq: Once | INTRAVENOUS | Status: AC
Start: 2019-04-28 — End: 2019-04-28
  Administered 2019-04-28: 12:00:00 12 mg via INTRAVENOUS
  Filled 2019-04-28: qty 50

## 2019-04-28 NOTE — Telephone Encounter (Signed)
I called Leslie [name] with a reminder for their genetic counseling appointment.    Randall [email or web address]  P [phone]  F [phone]

## 2019-04-28 NOTE — Progress Notes (Signed)
[name]   Visit Date: 04/28/2019    D1C2 DDAC  *ice chips with adriamycin    Leslie [name] is a 49 y.o. female patient of Leslie Fantasia, MD    Since her last visit, she has been doing well.   Overall, she states her energy level is good.  The patient is able to perform most usual functions.. Her appetite is reported to be intact, with no significant changes in weight.. She reports fatigue today. She reports she had constipation from day 2-5 last cycle. Report she called MD office and took miralax without any relief. On day 5 she took milk of magnesium with relief. Her plan is to start miralax on day 2 in the morning with breakfast, will reach out to MD if the constipation continues with this cycle. Denies any nausea or any other symptoms or concerns.   Diagnosis: Breast Cancer  Treatment on Clinical Trial: Not On Study (NOS)  [facility](O) - OP Adult - Breast - Dose Dense DOXOrubicin + Cyclophosphamide (DDAC) Q 14 Days x 4 Cycles (Followed by Taxane course)  Pre-Medications: dexamethasone aloxi and cinvanti     Line Type: Mediport,    Right Port  Blood Return verified prior to administration: Yes    Vital Signs:    Patient Vitals for the past 12 hrs:   BP Temp Pulse Resp   04/28/19 1103 127/87 98.6 F (37 C) 81 20      Body surface area is 2.58 meters squared.    '  Chemistry        Component Value Date/Time    NA 138 04/28/2019 1114    K 4.1 04/28/2019 1114    CL 102 04/28/2019 1114    CO2 29 04/28/2019 1114    BUN 14.0 04/28/2019 1114    CREAT 1.3 04/28/2019 1114    GLU 115 (H) 04/28/2019 1114        Component Value Date/Time    CA 9.6 04/28/2019 1114    ALKPHOS 82 04/14/2019 1600    AST <25 04/28/2019 1114    ALT <20 04/28/2019 1114    BILITOTAL 0.4 04/14/2019 1600            Lab Results   Component Value Date    WBC 12.02 (H) 04/28/2019    HGB 11.4 04/28/2019    PLT 130 (L) 04/28/2019    NEUTROABS 8.97 (H) 04/28/2019           Chemo Given within the past 12 hours:   "Chemo/Biotherapy  Given" (last 12 hours)     Date/Time Action User Medication Dose Rate Dose Volume (mL) Rate    04/28/19 1431 Given    AD heparin 100 UNIT/ML flush 5 mL 5 mL        04/28/19 1430 Given    AD pegfilgrastim (NEULASTA) delivery kit 6 mg 6 mg        04/28/19 1421 Stopped    AD cyclophosphamide (CYTOXAN) 1,500 mg in sodium chloride 0.9 % 360 mL chemo infusion  0 mL/hr       04/28/19 1321 New Bag    AD cyclophosphamide (CYTOXAN) 1,500 mg in sodium chloride 0.9 % 360 mL chemo infusion 1,500 mg 360 mL/hr       04/28/19 1314 Stopped    AD DOXOrubicin (ADRIAMYCIN) 155 mg in sodium chloride 0.9 % 362.5 mL chemo infusion  0 mL/hr       04/28/19 1214 New Bag    AD DOXOrubicin (ADRIAMYCIN)  155 mg in sodium chloride 0.9 % 362.5 mL chemo infusion 155 mg 363 mL/hr               Hypersensitivity Reaction Noted: No  IV Post Hydration: No, not required  Treatment Outcome:Patient tolerated treatment well. Central Line flushed and deacessed. OBI placed on pts R arm, pt verbalized when to remove.     Education: Patient/Family educated regarding the expected outcomes/side effects possible interactions of medications. Patient verbalizes understanding.    Follow-up Plan: Discharged in stable condition. Accompanied by, sister. Instructed to contact physician if any complications or unmanageable symptoms occur.    A return to the Infusion Center for the next treatment, has been scheduled.      Alicia Amel, RN    04/28/2019  2:40 PM

## 2019-04-29 ENCOUNTER — Encounter: Payer: Self-pay | Admitting: Internal Medicine

## 2019-04-29 ENCOUNTER — Ambulatory Visit: Payer: Commercial Managed Care - POS | Attending: Internal Medicine

## 2019-04-29 ENCOUNTER — Other Ambulatory Visit: Payer: Self-pay | Admitting: Internal Medicine

## 2019-04-29 ENCOUNTER — Telehealth: Payer: Self-pay | Admitting: Internal Medicine

## 2019-04-29 ENCOUNTER — Ambulatory Visit: Payer: Commercial Managed Care - POS

## 2019-04-29 DIAGNOSIS — Z171 Estrogen receptor negative status [ER-]: Secondary | ICD-10-CM | POA: Insufficient documentation

## 2019-04-29 DIAGNOSIS — Z5112 Encounter for antineoplastic immunotherapy: Secondary | ICD-10-CM | POA: Insufficient documentation

## 2019-04-29 DIAGNOSIS — C50412 Malignant neoplasm of upper-outer quadrant of left female breast: Secondary | ICD-10-CM

## 2019-04-29 DIAGNOSIS — Z5111 Encounter for antineoplastic chemotherapy: Secondary | ICD-10-CM

## 2019-04-29 MED ORDER — PEGFILGRASTIM 6 MG/0.6ML SC SOSY
6.00 mg | PREFILLED_SYRINGE | Freq: Once | SUBCUTANEOUS | Status: AC
Start: 2019-04-29 — End: 2019-04-29
  Administered 2019-04-29: 13:00:00 6 mg via SUBCUTANEOUS
  Filled 2019-04-29: qty 0.6

## 2019-04-29 NOTE — Progress Notes (Signed)
Patient of Dr. Jed Limerick here for Neulasta. Reports OBI stopped lighting up after 9:30 pm last night. Upon arrival, OBI lighting green. Line on meter is half way between "full" and "empty". Upon removal, needle was dislodged.     Pt denies fever/chills, respiratory symptoms, bowel issues, reports no other complaints. Reports nausea this morning that was alleviated with decadron. Reports fatigue.     Neulasta administered SQ via LUA without incident.     Advised to call office with questions or concerns.   Given printout of future treatments. RTC 05/12/19.   Discharged in stable condition.     [name], RN, OCN

## 2019-04-29 NOTE — Telephone Encounter (Signed)
Patient called last night about misplaced OBI. Please schedule neulasta injection today.   FO patient

## 2019-04-29 NOTE — Telephone Encounter (Signed)
Attempted to reach patient to let her know her appointment for Neulasta injection is at 10:30 today in the [facility].  No answer, L[facility] or to go immediately to [facility].

## 2019-04-29 NOTE — Progress Notes (Deleted)
Patient of Dr. Ladell Heads here for OBI. Reports the light on the OBI went off after 9:30 pm when she went to sleep. Reports no complaints today.     Neulasta on-body injector applied to *** . Site clear. Information with date and time of removal handed to patient.     Advised to call office with questions or concerns.   Given printout of future treatments. RTC 3/25 for C3 DDAC.   Discharged in stable condition.     [name], RN, OCN

## 2019-04-30 ENCOUNTER — Other Ambulatory Visit: Payer: Self-pay | Admitting: Internal Medicine

## 2019-04-30 DIAGNOSIS — Z5111 Encounter for antineoplastic chemotherapy: Secondary | ICD-10-CM

## 2019-04-30 DIAGNOSIS — Z171 Estrogen receptor negative status [ER-]: Secondary | ICD-10-CM

## 2019-05-02 ENCOUNTER — Telehealth: Payer: Self-pay

## 2019-05-02 ENCOUNTER — Other Ambulatory Visit: Payer: Self-pay | Admitting: Internal Medicine

## 2019-05-02 NOTE — Progress Notes (Signed)
RUTHMARIE ASTURIAS was referred to the Cancer Genetics clinic because of her personal and family history of cancer. A multi-gene panel was ordered for her.      This visit is being conducted via video and telephone. Verbal consent has been obtained from the patient to conduct a video and telephone visit to minimize exposure to COVID-19: yes.  Time spent in discussion: 25 minutes. We were joined on the call by Dr. Lenis Noon, a genetics fellow, who conducted the appointment.     Reason for Referral  Ja LILYROSE WOEHR is a 49 y.o. female of African American ancestry who is referred by Dr. Tye Savoy  for evaluation of a personal and family history of breast cancer, for a discussion regarding genetic testing and for recommendations for ongoing cancer surveillance and prevention.  The patient was accompanied on the call by her sister.    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 She is currently undergoing neoadjuvant chemotherapy as part of her treatment plan.      Zlaty N Joiners relevant gynecologic history is as follows: G3P3. She began menstruation at 24, was 23 at the birth of her first child, and had her uterus/left ovary removed at 27. She is 5'9 and 300 lbs.     [name] reports performing the following cancer screening:    Breast: This cancer was found on her first mammogram.    Ovaries: No formal ovarian cancer screening.    Colon: No formal colon cancer screening.    Skin: No formal skin cancer screening.       Family History  Parissa N Mischke's family history is significant for the following:    Family History   Problem Relation Age of Onset    Breast, vaginal cancer Paternal aunt 17, 73 deceased at 34    Breast cancer Mother     Breast cancer Maternal female cousin 79s, deceased at 62. She reported this cancer as metastatic    Breast cancer Paternal female cousin Unknown, currently living    "Cancer in the chest" Maternal  aunt 11, currently 47. She doesn't have a lot of information about this diagnosis.         Please see scanned pedigree under media tab for additional details of the family structure.    Assessment   Allysson N Mcgillicuddy's personal and family history warrants consideration of an inherited susceptibility to cancer.  Suggestive features include: (a) her personal diagnosis of triple negative disease (b) the additional report of breast cancer in relatives.     Approximately 5-10% of breast cancer cases are attributable to hereditary risk. Features suggestive of inherited risk include young age at diagnosis (typically <50y), triple negative pathology, or if an individual is affected with breast cancer and has additional close relative(s) diagnosed with breast cancer and/or other related cancers (including ovarian, pancreatic, or aggressive prostate). Lauriana N Joiners personal and family history of breast cancer causes [place] to consider the possibility for her to carry a mutation in a breast cancer susceptibility gene.     We discussed that there are different types of breast cancer susceptibility genes, including high risk (i.e. BRCA1 and BRCA2, PALB2, others), moderate risk (ATM, CHEK2) and newly described. Individuals that have a mutation in a gene in the latter two categories are more difficult to predict at the present time, given that mutations in these genes present with a variable clinical picture in  affected families. The most relevant of these for Daneille N Ware appears to be BRCA1, PALB2, RAD51C, RAD51D, BARD1, given the triple negative disease.      Studies of unselected triple negative breast cancer patients have identified mutations in BRCA1, BRCA2, and a few other breast cancer susceptibility genes (including BARD1, PALB2, BRIP1, RAD51C, and RAD51D) in ~10 - 20% of these patients. Current NCCN guidelines recommend consideration of genetic testing for patients diagnosed with triple negative breast cancers under age  69, regardless of additional family history.        Breast Cancer Susceptibility Gene / Condition   Lifetime Risk of Breast   Cancer  Other associated cancer risks / features    BRCA1, BRCA2     Hereditary Breast and Ovarian Cancer (HBOC) syndrome  50-60%     BRCA1 mutations often associated with triple negative pathology  Ovarian, pancreatic, female breast, melanoma, prostate    CDH1     Hereditary Diffuse Gastric Cancer (HDGC) syndrome  39-55%     Often lobular pathology  Diffuse gastric cancer    PALB2  Up to 53%     Associated with triple negative pathology  Pancreatic cancer    PTEN     PTEN-Hamartoma Tumor Syndrome  25-50%  Uterine, thyroid, kidney, colorectal cancer, other features    TP53       Burgess Amor syndrome  Significantly increased (over 50%)       Often diagnosed in 20s-30s, can be associated with triple positive (ER PR + Her 2 + ) pathology  Sarcoma, brain, adrenocortical, leukemia, others    ATM 24-48%  Pancreatic, prostate cancer    CHEK2  18-36% (frameshift mutations)      **Risk is mutation-specific  Colon, prostate cancer    STK11     Peutz-Jeghers syndrome  ~40-60% Breast, colon, small bowel, pancreatic    BARD1, FANCC, NBN, NF1, XRCC2  Increased (lower penetrance)   --    BRIP1, RAD51C, RAD51D  Increased    Associated with triple negative pathology  Ovarian cancer          She appears to be an appropriate candidate for genetic testing for hereditary cancer risk. She appears to meet current NCCN testing criteria for high penetrance breast and/or ovarian cancer susceptibility genes, based upon her personal and family history of cancer. A multi-gene panel is recommended to assess the potential for [name] to carry a mutation in one of several known cancer susceptibility genes. The identification of a mutation would guide the most appropriate cancer screening and risk reduction recommendations for PATTIE FLAHARTY and her family members.     Content of Discussion  We discussed the  difference between sporadic versus hereditary cancers and the relative frequencies of these.      We reviewed the availability of multi-gene panels and the varying levels of cancer risk associated with different genes. Broadly, we consider some genes to be "high risk" while others cause a more "moderate" level of risk.  Lastly, there are some "newly described" genes for which the level of cancer risk is not yet well understood.    We reviewed possible genetic test results, including positive, negative and inconclusive. Much of our discussion focused on the medical implications of a positive genetic test result, including risk(s) for possibly more than one type of cancer and the options for management of increased cancer risks including elevated surveillance, risk-reducing surgery and chemoprevention.  I explained that effective screening and/or risk-reducing options may  not exist for all of the increased cancer risks associated with the hereditary cancer susceptibility genes.     [name] understood the limitations in some of the currently available data, that recommendations for cancer screening and/or risk reduction are tailored to both the genetic test result and the personal/family history, and that her age and other factors need to be taken into account when trying to determine the degree of benefit that she may derive from risk-reducing surgery.       We discussed the fact that "negative" test results are of limited value unless a mutation has already been identified in the family. There is a significant population risk for cancer, and there may be unidentified genes responsible for the observed personal and family history.  Increased cancer surveillance (or prevention interventions) may be appropriate even after negative results, based upon empiric estimates of risk. There is a chance that a mutation in a particular cancer susceptibility gene may not be detected, even if one is present, due to either  technical limitations or laboratory error. We discussed that if her results do not identify a mutation, her relatives may have a higher than average risk to develop breast cancer based on familial risks that may not be able to be identified through genetic testing.     I also explained that the test result may identify a variant of uncertain significance.  These "variants" may or may not be associated with an increased cancer risk, and the optimal management of families transmitting such variants has not been defined.  The majority of variants of uncertain significance are ultimately reclassified as benign (not associated with an increased cancer risk).  Screening and prevention recommendations are therefore based on someone's personal and family histories.    We specifically discussed the CancerNext-Expanded panel from [facility].  With a panel there is an approximately 25% likelihood of finding a variant of unknown significance in one or more of the genes included.      We discussed the fact that genetic testing would be recommended for her immediate relatives (siblings and son) if her test result is positive.  In this case, each of them would have a 50% likelihood of sharing the mutation. In most cases, genetic testing for cancer susceptibility is not recommended for individuals under age 72 years unless the result will alter their medical management before this age.  We could also offer testing to more distant relatives.  Family members who choose not to be tested may be able to infer their mutation status from that of others tested.  If Aasiya N Dacanay's test result is negative or inconclusive, some of her relatives who have been affected with breast cancer may consider genetic testing.       I also discussed our policies for documentation of genetic test results in the medical record and the theoretical possibility of employment and insurance discrimination based on genetic test results.    Psychosocial  Considerations  Genetic testing may have significant psychological implications for both individuals and families.  During the session, TIERANY APPLEBY demonstrated understanding of the material presented. She raised appropriate questions, which were answered to the best of my ability.     Plan for Genetic Testing  KADELYN DIMASCIO is aware that genetic testing may indicate an increased risk for cancers unrelated to her personal and/or family history. [name] indicated intention to follow medical management guidelines provided on the basis of genetic testing and/or her personal/family history, as  appropriate. She reports intention to share the results of genetic testing and relevant management guidelines with her relatives as recommended.      After this discussion, BAILA ROUSE demonstrated appropriate understanding of the material covered and elected to pursue genetic testing.     [name] will be securely e-mailed the appropriate consent document(s) for her digital signature. Concurrently, we will submit the test requisition form to Kursten N Christmas's referring provider for their review and signature. A saliva kit will be sent to her directly to her. She will give the sample and send it back to the testing company.  Quinita N Yerian expressed understanding of and agreement to this plan.      The specific test ordered is CancerNext-Expanded.  Expected turn-around-time for the test is 2 to 3 weeks.     The insurance and billing process was reviewed.     Screening Recommendations and Plan for Follow-up  I will discuss screening and prevention recommendations when genetic test results are available to inform that discussion.     Conclusion   It was a pleasure to meet with [name] and I remain available for any additional questions that arise while we wait for genetic test results. I will contact [name] by phone when her results are available.  As has been discussed with the patient, if  she is unavailable and can't be reached on multiple occasions, I will disclose the result directly to the referring physician.    Roderic Ovens, MS, CGC  Licensed, Counselling [facility]  [address]  [place] [address]  T [phone]  F [phone]

## 2019-05-04 ENCOUNTER — Ambulatory Visit
Admission: RE | Admit: 2019-05-04 | Discharge: 2019-05-04 | Disposition: A | Discharge status: Home / Self Care | Payer: Commercial Managed Care - POS | Source: Ambulatory Visit | Attending: Residents | Admitting: Residents

## 2019-05-04 ENCOUNTER — Other Ambulatory Visit (INDEPENDENT_AMBULATORY_CARE_PROVIDER_SITE_OTHER): Payer: Self-pay

## 2019-05-04 DIAGNOSIS — Z170 Estrogen receptor positive status [ER+]: Secondary | ICD-10-CM | POA: Insufficient documentation

## 2019-05-04 DIAGNOSIS — I119 Hypertensive heart disease without heart failure: Secondary | ICD-10-CM | POA: Insufficient documentation

## 2019-05-04 DIAGNOSIS — C50912 Malignant neoplasm of unspecified site of left female breast: Secondary | ICD-10-CM | POA: Insufficient documentation

## 2019-05-04 NOTE — Progress Notes (Signed)
PG-SGA 05/04/2019   As compared to my normal intake, I would rate my food intake during the past month as less than usual   Intake Total Score 1   Symptom keeping patient from eating enough last 2 weeks: constipation   Symptom keeping patient from eating enough last 2 weeks: mouth sores   Symptom keeping patient from eating enough last 2 weeks: things taste funny or have no taste   Symptom keeping patient from eating enough last 2 weeks: problems swallowing   Symptom keeping patient from eating enough last 2 weeks: dry mouth   Symptom keeping patient from eating enough last 2 weeks: smells bother me   Symptom keeping patient from eating enough last 2 weeks: fatigue   Over the past month, I would generally rate my activity as: not my normal self, but able to be up and about with fairly normal activities   I currently weigh about -- pounds 304   I am currently about -- inches tall 69   One month ago I weighed about -- pounds 300   Six months ago I weighed about -- pounds 300   During the past 2 weeks my weight has increased   PG-SGA total score 11

## 2019-05-05 ENCOUNTER — Other Ambulatory Visit: Payer: Self-pay

## 2019-05-05 ENCOUNTER — Telehealth: Payer: Self-pay | Admitting: Internal Medicine

## 2019-05-05 LAB — VAHRT HISTORIC LVEF: Ejection Fraction: 64 %

## 2019-05-05 MED ORDER — MAGIC MOUTHWASH ORAL SUSPENSION
15.00 mL | Freq: Four times a day (QID) | ORAL | 0 refills | Status: AC
Start: 2019-05-05 — End: 2019-05-19

## 2019-05-05 NOTE — Telephone Encounter (Signed)
Roseanne Reno from [facility] calling. States there is a shortage of antacids and they have Mylanta available. Is asking if ok to switch from Maalox to Mylanta?  RN informed her ok to do so.

## 2019-05-05 NOTE — Telephone Encounter (Signed)
Patient sent a message via MyChart with some symptom issues.  Spoke with Dr. Ladell Heads regarding sore throat, difficulty swallowing.  Patient also reports she had an episode of brushing her teeth followed by vomiting blood and yellow stomach acid.  Discussed with Dr. Ladell Heads and Rx sent for Magic Mouthwash and patient instructed if symptoms worsen she needs to be seen in clinic and if any further vomiting blood, she needs to go directly to ED.  Patient verbalized understanding to all instructions above.  Rx sent to [facility].  No other needs noted at this time.

## 2019-05-06 ENCOUNTER — Encounter (INDEPENDENT_AMBULATORY_CARE_PROVIDER_SITE_OTHER): Payer: Self-pay | Admitting: Family Medicine

## 2019-05-09 ENCOUNTER — Ambulatory Visit: Payer: Commercial Managed Care - POS | Attending: Internal Medicine | Admitting: Internal Medicine

## 2019-05-09 ENCOUNTER — Telehealth: Payer: Self-pay | Admitting: Internal Medicine

## 2019-05-09 ENCOUNTER — Encounter: Payer: Self-pay | Admitting: Internal Medicine

## 2019-05-09 DIAGNOSIS — Z171 Estrogen receptor negative status [ER-]: Secondary | ICD-10-CM

## 2019-05-09 DIAGNOSIS — Z5111 Encounter for antineoplastic chemotherapy: Secondary | ICD-10-CM

## 2019-05-09 DIAGNOSIS — C50412 Malignant neoplasm of upper-outer quadrant of left female breast: Secondary | ICD-10-CM

## 2019-05-09 MED ORDER — TRAMADOL HCL 50 MG PO TABS
50.00 mg | ORAL_TABLET | Freq: Four times a day (QID) | ORAL | 0 refills | Status: AC | PRN
Start: 2019-05-09 — End: 2019-05-16

## 2019-05-09 NOTE — Telephone Encounter (Signed)
Continue AC q 2 weeks   She will send FMLA extension.   Sent rx tramadol.     RTC 05/23/2019 at 1240 pm

## 2019-05-09 NOTE — Progress Notes (Signed)
[facility]  [address] [name]  [phone]      [name] Office  [phone]          Visit Date: 05/09/2019      CONSENT: Verbal consent has been obtained from the patient to conduct a video visit encounter to minimize exposure to COVID-19.  The time spent in medical discussion during this visit was 15 minutes.      Chief Complaint   Patient presents with    Follow-up     several side effects from chemotherapy          HISTORY OF PRESENT ILLNESS:  Leslie [name] was seen today via video visit in oncology follow up.  S/p C2D1 of [facility] 04/28/2019.    Reported several side effects that have mostly improved:  Headache, sore throat (has improved since she started Magic mouthwash), sensitive skin, bone pain (taking Claritin for 5 days post chemotherapy, pain last for at least 1 week). She has severe heartburn that did not improve with Carafate so she started Pepcid last Wednesday.  Decadron worsens the acid reflux.  She is managing constipation with MiraLAX.      Oncology History Overview Note   Physician Requesting Consult: de Lawson Radar, Dairl Ponder, MD    Breast Surgeon: Orlene Och, MD  Radiation Oncologist:  Plastic Surgeon:   PCP: Satira Mccallum, MD      Leslie [name] is a 49 y.o.-year-old female with newly diagnosed IDC of the left breast.   She presented with abnormal mammogram.     Bilateral screening mammogram 02/2019 showed left breast 3.1 x 2 m hypoechoic mass and midly enlarged axillary node abnormality.     CNB left breast mass 2 OC and left axillary node 03/07/2019 [facility]) showed:   A) Breast: IDC, G3, ER 0%, PR 0%, Her 2 neu negative (1+) by IHC, Ki 67 80%  B) B) Lymph node, left axillary: benign-appearing lymph node.    Bilateral breast MRI and Genetic consult ordered. Path review pending.       Breast Cancer Risk Factors:  Age at Menarche:  61  Age at 63st FTP:  89    Still menstruating?:  No   Last menses: 2008   Hysterectomy: Yes 2008 TAH+ left oophorectomy   Estrogen  Therapy:          Oral Contraceptives: No           Hormone Replacement Therapy:  No         Reproductive Assistance Therapies:  No  Family History of Cancer:            Breast:   Yes maternal aunt age 22, maternal cousin age 69, paternal cousin age 19,       Ashkenazi Jewish Ancestry: No     Genetic testing: pending       Denies F/C/N/V/diarrhea. No CP/SOB. No HA/visual changes/focal weakness.        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, estrogen receptor negative     04/14/2019 -  Chemotherapy    cyclophosphamide (CYTOXAN) 1,500 mg in sodium chloride 0.9 % 360 mL chemo infusion, 1,554 mg, Intravenous, Once, 2 of 4 cycles  Administration: 1,500 mg (04/14/2019), 1,500 mg (04/28/2019)  pegfilgrastim (NEULASTA) injection 6 mg, 6 mg, Subcutaneous, Once, 2 of 2 cycles  Administration:  6 mg (04/14/2019), 6 mg (04/29/2019)  pegfilgrastim (NEULASTA) delivery kit 6 mg, 6 mg, Subcutaneous, Once, 1 of 3 cycles  Administration: 6 mg (04/28/2019)  DOXOrubicin (ADRIAMYCIN) 155 mg in sodium chloride 0.9 % 362.5 mL chemo infusion, 60 mg/m2 = 155 mg, Intravenous, Once, 2 of 4 cycles  Administration: 155 mg (04/14/2019), 155 mg (04/28/2019)     06/06/2019 -  Chemotherapy    CARBOplatin (PARAPLATIN) 224.25 mg in sodium chloride 0.9 % 272.43 mL chemo infusion, 224.25 mg, Intravenous, Once, 0 of 4 cycles  PACLitaxel (TAXOL) 210 mg in sodium chloride 0.9 % 285 mL chemo infusion, 80 mg/m2, Intravenous, Once, 0 of 4 cycles       Cancer Staging  No matching staging information was found for the patient.    Past Medical History:   Diagnosis Date    Fibroid     Herpes simplex without mention of complication 2003    possible recurrance in 2013    Hypertension     Lymphedema of extremity 11/03/2017    Neurologic disorder     brachial neuritis    Obesity     Urinary tract infection     Vitamin D  deficiency      Past Surgical History:   Procedure Laterality Date    HYSTERECTOMY  2008    fibroids, readmitted for pelvic abscess formation POD#3    MEDIPORT PLACEMENT N/A 04/08/2019    Procedure: MEDIPORT PLACEMENT;  Surgeon: Pollyann Kennedy, MD;  Location: [facility];  Service: Interventional Radiology;  Laterality: N/A;    OTHER SURGICAL HISTORY  05/04/2015    Obalon balloon - have 3 balloons placed in stomach for weight loss, Dr. Georgann Housekeeper    SALPINGOOPHORECTOMY Right 2008    RSO with hysterectomy    TUBAL LIGATION  02/17/1997     Social History     Socioeconomic History    Marital status: Legally Separated     Spouse name: None    Number of children: None    Years of education: None    Highest education level: None   Occupational History    None   Tobacco Use    Smoking status: Never Smoker    Smokeless tobacco: Never Used   Substance and Sexual Activity    Alcohol use: Yes     Comment: wine - social, 2-4 times a month    Drug use: No    Sexual activity: Yes     Partners: Male   Other Topics Concern    None   Social History Narrative    Has elliptical machine        Lives with boyfriend, daughter home        Son died on 02-09-19 in CCU     Social Determinants of Health     Financial Resource Strain:     Difficulty of Paying Living Expenses:    Food Insecurity:     Worried About Programme researcher, broadcasting/film/video in the Last Year:     Barista in the Last Year:    Transportation Needs:     Freight forwarder (Medical):     Lack of Transportation (Non-Medical):    Physical Activity: Insufficiently Active    Days of Exercise per Week: 7 days    Minutes of Exercise per Session: 20 min   Stress:     Feeling of Stress :    Social Connections:     Frequency of Communication with Friends and Family:  Frequency of Social Gatherings with Friends and Family:     Attends Religious Services:     Active Member of Clubs or Organizations:     Attends Engineer, structural:     Marital  Status:    Intimate Partner Violence:     Fear of Current or Ex-Partner:     Emotionally Abused:     Physically Abused:     Sexually Abused:       Family History   Problem Relation Age of Onset    Hypertension Mother     Coronary artery disease Mother     Heart disease Mother     Hyperlipidemia Mother     Hypertension Father     Diabetes Father     Other Father         cardiac catheterization    Hypertension Sister     Breast cancer Maternal Aunt     Uterine cancer Maternal Aunt     Vaginal cancer Maternal Aunt     Breast cancer Other         maternal cousin    Ovarian cancer Neg Hx      OB History     Gravida   3    Para   3    Term   3    Preterm        AB        Living   2       SAB        TAB        Ectopic        Multiple        Live Births   2                Allergies   Allergen Reactions    Valsartan Other (See Comments)     achiness    Penicillins Hives     Symptoms occurred as child  Onset date: 07-28-2004     has a current medication list which includes the following prescription(s): aluminum & magnesium / diphenhydramine / lidocaine / nystatin, ergocalciferol, losartan-hydrochlorothiazide, olanzapine, ondansetron, prochlorperazine, dexamethasone, lidocaine-prilocaine, tizanidine, and tramadol.        REVIEW OF SYSTEMS: 14 pt ROS reviewed with the patient and negative except for as documented in the HPI. All other systems reviewed.      PHYSICAL EXAM:         Gen: well appearing alert well nourished female alert in NAD  HEENT: NCAT   Neck: supple  Chest: respirations even and unlabored   BLN: supraclav, cervical LNs with no visual adenopathy  Skin: no rashes or lesions appreciated  Neuro: alert, active, EOMI, face symm  Psych: normal mood and affect      Labs Results:  Reviewed in epic today  Lab Results   Component Value Date    WBC 12.02 (H) 04/28/2019    HGB 11.4 04/28/2019    HCT 34.5 (L) 04/28/2019    MCV 86.9 04/28/2019    PLT 130 (L) 04/28/2019     Lab Results   Component Value  Date    CREAT 1.3 04/28/2019    BUN 14.0 04/28/2019    NA 138 04/28/2019    K 4.1 04/28/2019    CL 102 04/28/2019    CO2 29 04/28/2019     Lab Results   Component Value Date    ALT <20 04/28/2019    AST <25 04/28/2019    ALKPHOS 82  04/14/2019    BILITOTAL 0.4 04/14/2019       Imaging Results: none this visit      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     C1D1 AC on 04/14/2019. Experienced severe constipation and heartburn.   C2D1 AC on 04/28/2019.   C3D1 scheduled on 05/12/2019   For heartburn, I do recommend Pepcid and use of carafate as needed.     Followed by cardio-oncology.   Baseline ECHO 04/12/2019 showed EF 66% and GLS -19%.  ECHO post C#2 [facility] 05/04/2019 showed EF 64% and GLS -19%.    Several issues with chemotherapy:   Dyspepsia: continue scheduled pecpid and carafate as needed. Stop dexa, but increase zyprexa to 5 mg  Bone pain: likely from neulasta. Extend claritin to 1 week post chemo. Send Rx for tramadol as needed   Headache: tramadol as needed  Constipation: continue miralax  Mucositis: continue MMW     She will need ultrasound left breast after AC to assess response as lump not palpable.     RTC 05/23/2019 at 12:20         LOS Documentation:    Number and Complexity of Problems Addressed:  1 or more chronic illnesses with severe exacerbation, progression, or side effects of treatment    Amount and/or Complexity of Data Reviewed:  - Reviewed the following results from 2021: 3+ unique labs and 1 unique imaging test    Risk of  Complications and/or Morbidity or Mortality of Patient Management:  Drug therapy requiring intensive monitoring for toxicity

## 2019-05-09 NOTE — Telephone Encounter (Signed)
Please assist patient to schedule follow up on 05/23/19 @12 :40p.m.

## 2019-05-10 ENCOUNTER — Encounter: Payer: Self-pay | Admitting: Internal Medicine

## 2019-05-11 ENCOUNTER — Encounter (INDEPENDENT_AMBULATORY_CARE_PROVIDER_SITE_OTHER): Payer: Self-pay | Admitting: Family Medicine

## 2019-05-11 ENCOUNTER — Encounter: Payer: Self-pay | Admitting: Internal Medicine

## 2019-05-11 ENCOUNTER — Other Ambulatory Visit (INDEPENDENT_AMBULATORY_CARE_PROVIDER_SITE_OTHER): Payer: Self-pay | Admitting: Family Medicine

## 2019-05-11 ENCOUNTER — Other Ambulatory Visit: Payer: Self-pay | Admitting: Internal Medicine

## 2019-05-11 DIAGNOSIS — I10 Essential (primary) hypertension: Secondary | ICD-10-CM

## 2019-05-11 MED ORDER — LOSARTAN POTASSIUM-HCTZ 100-25 MG PO TABS
1.0000 | ORAL_TABLET | Freq: Every day | ORAL | 0 refills | Status: DC
Start: 2019-05-11 — End: 2019-05-11

## 2019-05-11 MED ORDER — LOSARTAN POTASSIUM-HCTZ 100-25 MG PO TABS
1.0000 | ORAL_TABLET | Freq: Every day | ORAL | 0 refills | Status: DC
Start: 2019-05-11 — End: 2019-05-30

## 2019-05-11 NOTE — Progress Notes (Signed)
Sent to pharmacy 

## 2019-05-11 NOTE — Telephone Encounter (Signed)
Pt last seen on 01/31/2019  Pt with outstanding labs as ordered by Dr. Jonathon Bellows on 01/11/2019 & 01/25/2019  Meds last refilled on 02/19/2019; #30 with #1 RF    Called and spoke with pt.  She will be starting chemo in April 2021.  Pt states that she has not yet had outstanding labs drawn.  Advised it may be convenient to coordinate with infusion to have labs drawn via port.  Recommended VV with Dr. Jonathon Bellows to touch base before starting chemo.  VV scheduled for 05/30/2019.  Pt aware that temp refill will be sent until she can see Dr. Jonathon Bellows.  Pt amenable.

## 2019-05-12 ENCOUNTER — Ambulatory Visit: Payer: Commercial Managed Care - POS | Attending: Internal Medicine

## 2019-05-12 ENCOUNTER — Other Ambulatory Visit: Payer: Self-pay | Admitting: Internal Medicine

## 2019-05-12 VITALS — BP 128/88 | HR 98 | Resp 18 | Wt 303.6 lb

## 2019-05-12 DIAGNOSIS — Z171 Estrogen receptor negative status [ER-]: Secondary | ICD-10-CM | POA: Insufficient documentation

## 2019-05-12 DIAGNOSIS — C50412 Malignant neoplasm of upper-outer quadrant of left female breast: Secondary | ICD-10-CM

## 2019-05-12 DIAGNOSIS — Z5111 Encounter for antineoplastic chemotherapy: Secondary | ICD-10-CM | POA: Insufficient documentation

## 2019-05-12 DIAGNOSIS — Z5112 Encounter for antineoplastic immunotherapy: Secondary | ICD-10-CM | POA: Insufficient documentation

## 2019-05-12 LAB — CBC AND DIFFERENTIAL
Absolute NRBC: 0.02 10*3/uL — ABNORMAL HIGH (ref 0.00–0.00)
Basophils Absolute Automated: 0.03 10*3/uL (ref 0.00–0.08)
Basophils Automated: 0.3 %
Eosinophils Absolute Automated: 0.01 10*3/uL (ref 0.00–0.44)
Eosinophils Automated: 0.1 %
Hematocrit: 31.9 % — ABNORMAL LOW (ref 34.7–43.7)
Hgb: 10.4 g/dL — ABNORMAL LOW (ref 11.4–14.8)
Immature Granulocytes Absolute: 0.4 10*3/uL — ABNORMAL HIGH (ref 0.00–0.07)
Immature Granulocytes: 4.2 %
Lymphocytes Absolute Automated: 1.46 10*3/uL (ref 0.42–3.22)
Lymphocytes Automated: 15.4 %
MCH: 28.7 pg (ref 25.1–33.5)
MCHC: 32.6 g/dL (ref 31.5–35.8)
MCV: 87.9 fL (ref 78.0–96.0)
MPV: 9.8 fL (ref 8.9–12.5)
Monocytes Absolute Automated: 0.82 10*3/uL (ref 0.21–0.85)
Monocytes: 8.7 %
Neutrophils Absolute: 6.75 10*3/uL — ABNORMAL HIGH (ref 1.10–6.33)
Neutrophils: 71.3 %
Nucleated RBC: 0.2 /100 WBC — ABNORMAL HIGH (ref 0.0–0.0)
Platelets: 140 10*3/uL — ABNORMAL LOW (ref 142–346)
RBC: 3.63 10*6/uL — ABNORMAL LOW (ref 3.90–5.10)
RDW: 14 % (ref 11–15)
WBC: 9.47 10*3/uL (ref 3.10–9.50)

## 2019-05-12 LAB — RRL - CMP
ALT Piccolo(R): <20 U/L (ref 10–47)
AST Piccolo(R): <25 U/L (ref 11–38)
Albumin Piccolo(R): 3.6 g/dL (ref 3.3–5.0)
Alkaline Phosphatase Piccolo(R): 70 U/L (ref 42–141)
BUN Piccolo(R): 11 mg/dL (ref 7.0–19.0)
Bilirubin Total Piccolo(R): 0.4 mg/dL (ref 0.2–1.6)
CO2 Piccolo(R): 30 mEq/L — ABNORMAL HIGH (ref 21–29)
Calcium Piccolo(R): 9.2 mg/dL (ref 8.5–10.5)
Chloride Piccolo(R): 100 mEq/L (ref 98–107)
Creatinine Piccolo(R): 0.9 mg/dL (ref 0.4–1.5)
Glucose Piccolo(R): 101 mg/dL — ABNORMAL HIGH (ref 70–100)
Potassium Piccolo(R): 3.9 mEq/L (ref 3.5–5.1)
Protein Piccolo(R): 6.8 g/dL (ref 6.4–8.1)
Sodium Piccolo(R): 137 mEq/L (ref 136–145)

## 2019-05-12 LAB — GFR: GFR Piccolo(R): >60

## 2019-05-12 MED ORDER — HEPARIN SOD (PORK) LOCK FLUSH 100 UNIT/ML IV SOLN
5.00 mL | INTRAVENOUS | Status: DC | PRN
Start: 2019-05-12 — End: 2019-05-12
  Administered 2019-05-12: 15:00:00 5 mL
  Filled 2019-05-12: qty 5

## 2019-05-12 MED ORDER — PALONOSETRON HCL 0.25 MG/5ML IV SOLN
250.00 ug | Freq: Once | INTRAVENOUS | Status: AC
Start: 2019-05-12 — End: 2019-05-12
  Administered 2019-05-12: 12:00:00 0.25 mg via INTRAVENOUS
  Filled 2019-05-12: qty 5

## 2019-05-12 MED ORDER — SODIUM CHLORIDE 0.9 % IV SOLN
1500.00 mg | Freq: Once | INTRAVENOUS | Status: AC
Start: 2019-05-12 — End: 2019-05-12
  Administered 2019-05-12: 13:00:00 1500 mg via INTRAVENOUS
  Filled 2019-05-12: qty 50

## 2019-05-12 MED ORDER — DEXAMETHASONE IVPB 12 MG IN NS 50 ML
12.00 mg | Freq: Once | INTRAVENOUS | Status: AC
Start: 2019-05-12 — End: 2019-05-12
  Administered 2019-05-12: 12:00:00 12 mg via INTRAVENOUS
  Filled 2019-05-12: qty 50

## 2019-05-12 MED ORDER — PEGFILGRASTIM 6 MG/0.6ML SC PSKT
6.00 mg | PREFILLED_SYRINGE | Freq: Once | SUBCUTANEOUS | Status: AC
Start: 2019-05-12 — End: 2019-05-12
  Administered 2019-05-12: 14:00:00 6 mg via SUBCUTANEOUS
  Filled 2019-05-12: qty 0.6

## 2019-05-12 MED ORDER — APREPITANT 130 MG/18ML IV EMUL
130.00 mg | Freq: Once | INTRAVENOUS | Status: AC
Start: 2019-05-12 — End: 2019-05-12
  Administered 2019-05-12: 12:00:00 130 mg via INTRAVENOUS
  Filled 2019-05-12: qty 18

## 2019-05-12 MED ORDER — DOXORUBICIN HCL 2 MG/ML IV SOLN
60.00 mg/m2 | Freq: Once | INTRAVENOUS | Status: AC
Start: 2019-05-12 — End: 2019-05-12
  Administered 2019-05-12: 12:00:00 155 mg via INTRAVENOUS
  Filled 2019-05-12: qty 77.5

## 2019-05-12 NOTE — Progress Notes (Signed)
[name]   Visit Date: 05/12/2019    D1C3 DDAC   *ice chips during adriamycin    Leslie [name] is a 49 y.o. female patient of [name], MD    Since her last visit, she has been doing well.   Overall, she states her energy level is good.  The patient is able to perform most usual functions.. Her appetite is reported to be intact, with no significant changes in weight.. She reports no complaints today. She did have severe nausea with last cycle, that resulted din her vomiting. She did report to MD, pts nausea meds have since changed. She is taking olanzapine nightly as well as compazine. She reports mouth sores under her tongue, she is using magic mouth wash and reports it is helping. Complained of fatigue as well. Denies loss of appetite.  She has no other concerns. There are no social work or other referral needs at this time.    Diagnosis: Breast Cancer  Treatment on Clinical Trial: Not On Study (NOS)  [facility](O) - OP Adult - Breast - Dose Dense DOXOrubicin + Cyclophosphamide (DDAC) Q 14 Days x 4 Cycles (Followed by Taxane course)  Pre-Medications: dexamethasone, aloxi, cinvanti     Line Type: Mediport,    Right Port  Blood Return verified prior to administration: Yes    Vital Signs:    Patient Vitals for the past 12 hrs:   BP Pulse Resp   05/12/19 1102 128/88 98 18      Body surface area is 2.59 meters squared.    '  Chemistry        Component Value Date/Time    NA 137 05/12/2019 1111    K 3.9 05/12/2019 1111    CL 100 05/12/2019 1111    CO2 30 (H) 05/12/2019 1111    BUN 11.0 05/12/2019 1111    CREAT 0.9 05/12/2019 1111    GLU 101 (H) 05/12/2019 1111        Component Value Date/Time    CA 9.2 05/12/2019 1111    ALKPHOS 82 04/14/2019 1600    AST <25 05/12/2019 1111    ALT <20 05/12/2019 1111    BILITOTAL 0.4 04/14/2019 1600            Lab Results   Component Value Date    WBC 9.47 05/12/2019    HGB 10.4 (L) 05/12/2019    PLT 140 (L) 05/12/2019    NEUTROABS 6.75 (H) 05/12/2019            Chemo Given within the past 12 hours:   "Chemo/Biotherapy Given" (last 12 hours)     Date/Time Action User Medication Dose Rate Dose Volume (mL) Rate    05/12/19 1525 Given    AD heparin 100 UNIT/ML flush 5 mL 5 mL        05/12/19 1420 Given    AD pegfilgrastim (NEULASTA) delivery kit 6 mg 6 mg        05/12/19 1416 Stopped    AD cyclophosphamide (CYTOXAN) 1,500 mg in sodium chloride 0.9 % 360 mL chemo infusion  0 mL/hr       05/12/19 1316 New Bag    AD cyclophosphamide (CYTOXAN) 1,500 mg in sodium chloride 0.9 % 360 mL chemo infusion 1,500 mg 360 mL/hr       05/12/19 1313 Stopped    AD DOXOrubicin (ADRIAMYCIN) 155 mg in sodium chloride 0.9 % 362.5 mL chemo infusion  0 mL/hr  05/12/19 1213 New Bag    AD DOXOrubicin (ADRIAMYCIN) 155 mg in sodium chloride 0.9 % 362.5 mL chemo infusion 155 mg 363 mL/hr               Hypersensitivity Reaction Noted: No  IV Post Hydration: No, not required  Treatment Outcome:Patient tolerated treatment well. Central Line flushed and deacessed. Neulasta put on pts R arm subq, pt verbalized when to remove.    Education: Patient/Family educated regarding the expected outcomes/side effects possible interactions of medications. Patient verbalizes understanding.    Follow-up Plan: Discharged in stable condition. Accompanied by, sister. Instructed to contact physician if any complications or unmanageable symptoms occur.    A return to the Infusion Center for the next treatment, has been scheduled.      Alicia Amel, RN    05/12/2019  2:32 PM

## 2019-05-16 ENCOUNTER — Telehealth (HOSPITAL_BASED_OUTPATIENT_CLINIC_OR_DEPARTMENT_OTHER): Payer: Self-pay

## 2019-05-16 NOTE — Telephone Encounter (Signed)
Called patient to follow up on her CIDS-SF form from 04/28/19. Introduced self and role with LWC. Explained the purpose of call. Patient discussed tx and side effects and discussed her family and support network. She stated her 49 year old-son passed away and, emotionally she's "grieving" and "all over the place. She stated, "I'm not depressed," "not anxious" and is seeing a counselor in the community.  She stated she has "a good support system" of "mom," "my man," "church family," and "Facebook support group." Told her about [facility] breast cancer support programs and offered her information on bereavement support in the community. Following our meeting, emailed pt flyers for [facility] breast cancer support groups, SOS program, LWC on-line programs, and provided this OCT's and NN Kaitlyn Hegarty's contact information. Plan to follow up with patient in the infusion clinic, support and assist, as needed.    Sadie Haber, MSW, LCSW, OSW-C  Oncology Clinical Therapist 2  [facility]  [phone]

## 2019-05-19 ENCOUNTER — Encounter: Payer: Self-pay | Admitting: Internal Medicine

## 2019-05-23 ENCOUNTER — Encounter: Payer: Self-pay | Admitting: Internal Medicine

## 2019-05-23 ENCOUNTER — Telehealth: Payer: Self-pay | Admitting: Internal Medicine

## 2019-05-23 ENCOUNTER — Other Ambulatory Visit: Payer: Self-pay | Admitting: Internal Medicine

## 2019-05-23 DIAGNOSIS — Z170 Estrogen receptor positive status [ER+]: Secondary | ICD-10-CM

## 2019-05-23 DIAGNOSIS — Z5111 Encounter for antineoplastic chemotherapy: Secondary | ICD-10-CM

## 2019-05-23 DIAGNOSIS — I10 Essential (primary) hypertension: Secondary | ICD-10-CM

## 2019-05-23 DIAGNOSIS — C50412 Malignant neoplasm of upper-outer quadrant of left female breast: Secondary | ICD-10-CM

## 2019-05-23 DIAGNOSIS — Z171 Estrogen receptor negative status [ER-]: Secondary | ICD-10-CM

## 2019-05-23 DIAGNOSIS — Z9189 Other specified personal risk factors, not elsewhere classified: Secondary | ICD-10-CM

## 2019-05-23 NOTE — Telephone Encounter (Signed)
Spoke with patient via phone to follow up on MyChart message she sent, she described the tightness in her chest as when she talks in a conversation, if her voice rises any, then her "chest gets tight, and feels like it's going to shut down my throat".  She denies shortness of breath, though RN wonders if what she is describing isn't shortness of breath with quick recovery, however, patient denies shortness of breath with ambulation etc.  She reports that when she bends over to pick something up from the floor she is extremely dizzy.  Reviewed with Dr. Ladell Heads who would like the patient to have CT Angiogram to rule out PE, however, the soonest this could be scheduled in the 8th.  Dr Ladell Heads recommends patient go to ED for evaluation.  RN spoke with patient again to make her aware of Dr. Jed Limerick recommendation, however, patient is reluctant to go to ED stating "by the time I go over there and get back it will have taken so much out of me, I want to wait through the night and see how I feel".  RN reported she would let Dr. Ladell Heads know, will attempt to get a sooner appointment with [facility] if possible.

## 2019-05-24 ENCOUNTER — Telehealth: Payer: Self-pay | Admitting: Internal Medicine

## 2019-05-24 NOTE — Telephone Encounter (Signed)
RN able to get patient scheduled at [facility] for 2p.m. today, when called to make her aware of the appointment, patient states she has decided it is muscular, as she notices she has began to have vomiting when brushing her teeth of the a.m. and she believes that she has strained the muscles in her chest causing her to have the symptoms she is having.  Continues to deny shortness of breath and declines CT Angiogram.  Appointment cancelled and physician aware.

## 2019-05-25 ENCOUNTER — Ambulatory Visit (INDEPENDENT_AMBULATORY_CARE_PROVIDER_SITE_OTHER): Payer: Self-pay | Admitting: Residents

## 2019-05-25 ENCOUNTER — Other Ambulatory Visit: Payer: Self-pay | Admitting: Internal Medicine

## 2019-05-25 DIAGNOSIS — C50412 Malignant neoplasm of upper-outer quadrant of left female breast: Secondary | ICD-10-CM

## 2019-05-25 DIAGNOSIS — Z5111 Encounter for antineoplastic chemotherapy: Secondary | ICD-10-CM

## 2019-05-26 ENCOUNTER — Encounter (INDEPENDENT_AMBULATORY_CARE_PROVIDER_SITE_OTHER): Payer: Self-pay | Admitting: Family Medicine

## 2019-05-26 ENCOUNTER — Ambulatory Visit (HOSPITAL_BASED_OUTPATIENT_CLINIC_OR_DEPARTMENT_OTHER): Payer: Commercial Managed Care - POS | Admitting: Family

## 2019-05-26 ENCOUNTER — Telehealth: Payer: Self-pay | Admitting: Family

## 2019-05-26 ENCOUNTER — Ambulatory Visit
Admission: RE | Admit: 2019-05-26 | Discharge: 2019-05-26 | Disposition: A | Discharge status: Home / Self Care | Payer: Commercial Managed Care - POS | Source: Ambulatory Visit | Attending: Family | Admitting: Family

## 2019-05-26 ENCOUNTER — Encounter: Payer: Self-pay | Admitting: Internal Medicine

## 2019-05-26 ENCOUNTER — Ambulatory Visit: Payer: Commercial Managed Care - POS

## 2019-05-26 ENCOUNTER — Ambulatory Visit: Payer: Commercial Managed Care - POS | Attending: Internal Medicine

## 2019-05-26 VITALS — BP 119/80 | HR 118 | Temp 98.0°F | Resp 18 | Ht 69.02 in | Wt 303.4 lb

## 2019-05-26 DIAGNOSIS — Z5111 Encounter for antineoplastic chemotherapy: Secondary | ICD-10-CM

## 2019-05-26 DIAGNOSIS — I82621 Acute embolism and thrombosis of deep veins of right upper extremity: Secondary | ICD-10-CM

## 2019-05-26 DIAGNOSIS — I82290 Acute embolism and thrombosis of other thoracic veins: Secondary | ICD-10-CM | POA: Insufficient documentation

## 2019-05-26 DIAGNOSIS — Z171 Estrogen receptor negative status [ER-]: Secondary | ICD-10-CM | POA: Insufficient documentation

## 2019-05-26 DIAGNOSIS — I82C11 Acute embolism and thrombosis of right internal jugular vein: Secondary | ICD-10-CM | POA: Insufficient documentation

## 2019-05-26 DIAGNOSIS — I82B13 Acute embolism and thrombosis of subclavian vein, bilateral: Secondary | ICD-10-CM | POA: Insufficient documentation

## 2019-05-26 DIAGNOSIS — C50412 Malignant neoplasm of upper-outer quadrant of left female breast: Secondary | ICD-10-CM | POA: Insufficient documentation

## 2019-05-26 DIAGNOSIS — I878 Other specified disorders of veins: Secondary | ICD-10-CM | POA: Insufficient documentation

## 2019-05-26 DIAGNOSIS — R911 Solitary pulmonary nodule: Secondary | ICD-10-CM | POA: Insufficient documentation

## 2019-05-26 DIAGNOSIS — C50419 Malignant neoplasm of upper-outer quadrant of unspecified female breast: Secondary | ICD-10-CM

## 2019-05-26 MED ORDER — IOHEXOL 350 MG/ML IV SOLN
100.00 mL | Freq: Once | INTRAVENOUS | Status: AC | PRN
Start: 2019-05-26 — End: 2019-05-26
  Administered 2019-05-26: 100 mL via INTRAVENOUS

## 2019-05-26 MED ORDER — ENOXAPARIN SODIUM 120 MG/0.8ML SC SOLN
120.00 mg | Freq: Two times a day (BID) | SUBCUTANEOUS | 1 refills | Status: DC
Start: 2019-05-26 — End: 2019-06-02

## 2019-05-26 MED ORDER — ENOXAPARIN SODIUM 120 MG/0.8ML SC SOLN
120.00 mg | Freq: Two times a day (BID) | SUBCUTANEOUS | 1 refills | Status: DC
Start: 2019-05-26 — End: 2019-05-26

## 2019-05-26 MED ORDER — HEPARIN SOD (PORK) LOCK FLUSH 100 UNIT/ML IV SOLN
INTRAVENOUS | Status: AC
Start: 2019-05-26 — End: ?
  Filled 2019-05-26: qty 5

## 2019-05-26 NOTE — Telephone Encounter (Signed)
Talked with the patient. She is waiting for chest CT angiogram. I informed her of DVT in her  of her right arm. Rx for Lovenox sent to the [facility] pharmacy. Patient is aware to pick the prescription on her way back here.

## 2019-05-26 NOTE — Progress Notes (Signed)
Pt of Dr. Ladell Heads here for C4 DDAC. Pt tachycardic on arrival, otherwise VSS.    Pt appears in clinic today with swelling on R side of neck above port sight, R arm and R hand swelling that she noted yesterday evening. No swelling noted in L extremity. Pt continues with chest pain when bending over to pick objects up. She had reached out to MD office on 4/5 and was advised to go to ED, pain continued till morning of 4/7, she took Tramadol and chest pain went away as the day went on. In the evening of 4/7 is when she noticed the swelling and chest pain started again.      Pt seen by [name] NP today to assess, pt then sent for STAT ultrasound and CT of chest. Pt returned, R arm does have clots and CT showed no PE. Pt to start eliquis tonight, reviewed how to give eliquis to herself. Pt verbalized understanding.     Pt left as she came, to return for C4 DDAC on 4/15.

## 2019-05-27 ENCOUNTER — Encounter: Payer: Self-pay | Admitting: Internal Medicine

## 2019-05-27 NOTE — Progress Notes (Addendum)
PROGRESS NOTE      Patient Name: Leslie [name]      CC/HPI:   HPI     Leslie [name] with breast cancer presents to the infusion clinic for Cycle 4, Day 1 DDAC    Oncologist: A. Ladell Heads, MD    Ms. Leslie [name] reports onset of chest pain on Sunday night 4/4. She reported this to the oncology team and was advised to go to the nearest ED for evaluation. Patient decided to stay home. She took Tramadol which provided with chest pain relief. However, chest pain persisted on and off throughout the week. Today morning patient woke up with pain in her neck and noted swelling in her right arm. Currently, she reports pain around her right chest MediPort site as well as medial aspect upper arm above elbow and right axilla.  Patient reports feeling dizzy especially with changing positions from bending down.  Patient denies shortness of breath and dyspnea  Patient denies cough, fever, chills, palpitations, GI/GU symptoms, change in lower extremity swelling (patient with history of LE lymphedema).    Review of Systems     Review of Systems   Constitutional: Positive for malaise/fatigue. Negative for chills and fever.   Respiratory: Negative for cough and shortness of breath.    Cardiovascular: Positive for chest pain. Negative for palpitations and leg swelling.   Gastrointestinal: Negative.    Genitourinary: Negative.    Musculoskeletal: Negative.    Skin: Negative.    Neurological: Positive for dizziness. Negative for speech change, weakness and headaches.   Psychiatric/Behavioral: Negative.           Medications:       Current Outpatient Medications:     dexamethasone (DECADRON) 4 MG tablet, Take 8 mg (2 tabs) on days 2, 3, and 4 for breakfast and days 3 and 4 for lunch of each chemotherapy cycle., Disp: 40 tablet, Rfl: 0    enoxaparin (Lovenox) 120 MG/0.8ML Solution, Inject 0.8 mLs (120 mg total) into the skin every 12 (twelve) hours, Disp: 60 Syringe, Rfl: 1    ergocalciferol (ERGOCALCIFEROL) 1.25 MG (50000 UT)  capsule, Take 1 capsule (50,000 Units total) by mouth once a week, Disp: 12 capsule, Rfl: 1    lidocaine-prilocaine (EMLA) cream, Apply topically as needed (one hour prior to chemotherapy appointment), Disp: 30 g, Rfl: 3    losartan-hydrochlorothiazide (HYZAAR) 100-25 MG per tablet, Take 1 tablet by mouth daily, Disp: 90 tablet, Rfl: 0    OLANZapine (ZyPREXA) 2.5 MG tablet, Take one tab at bedtime days 1,2,3, and 4 of each chemotherapy cycle, Disp: 16 tablet, Rfl: 0    ondansetron (Zofran ODT) 8 MG disintegrating tablet, Take 1 tablet (8 mg total) by mouth every 8 (eight) hours as needed for Nausea, Disp: 60 tablet, Rfl: 3    prochlorperazine (COMPAZINE) 10 MG tablet, Take 1 tablet (10 mg total) by mouth every 6 (six) hours as needed (nausea), Disp: 30 tablet, Rfl: 3    tiZANidine (Zanaflex) 4 MG tablet, One tab every 8 hrs as needed sparingly to relax muscles, Disp: 30 tablet, Rfl: 0  No current facility-administered medications for this visit.    Past History:     Past Medical History:   Diagnosis Date    Fibroid     Herpes simplex without mention of complication 2003    possible recurrance in 2013    Hypertension     Lymphedema of extremity 11/03/2017    Neurologic disorder     brachial neuritis  Obesity     Urinary tract infection     Vitamin D deficiency          Physical Exam:       Physical Exam  Vitals reviewed.   Constitutional:       General: She is not in acute distress.  Cardiovascular:      Rate and Rhythm: Regular rhythm. Tachycardia present.      Heart sounds: Normal heart sounds.      Comments: Right upper extremity and right chest wall edema  around Mediport site  Pulmonary:      Effort: Pulmonary effort is normal. No tachypnea.      Breath sounds: Normal breath sounds and air entry. No decreased breath sounds, wheezing or rales.      Comments: SpO2 at rest and ambulatory >96%  Musculoskeletal:      Cervical back: Edema (right side) present.   Lymphadenopathy:      Cervical: No  cervical adenopathy.   Skin:     General: Skin is warm and dry.   Neurological:      Mental Status: She is alert and oriented to person, place, and time.   Psychiatric:         Mood and Affect: Mood normal.           Labs:     No visits with results within 1 Day(s) from this visit.   Latest known visit with results is:   Infusion on 05/12/2019   Component Date Value Ref Range Status    WBC 05/12/2019 9.47  3.10 - 9.50 x10 3/uL Final    Hgb 05/12/2019 10.4* 11.4 - 14.8 g/dL Final    Hematocrit 16/11/9602 31.9* 34.7 - 43.7 % Final    Platelets 05/12/2019 140* 142 - 346 x10 3/uL Final    RBC 05/12/2019 3.63* 3.90 - 5.10 x10 6/uL Final    MCV 05/12/2019 87.9  78.0 - 96.0 fL Final    MCH 05/12/2019 28.7  25.1 - 33.5 pg Final    MCHC 05/12/2019 32.6  31.5 - 35.8 g/dL Final    RDW 54/10/8117 14  11 - 15 % Final    MPV 05/12/2019 9.8  8.9 - 12.5 fL Final    Neutrophils 05/12/2019 71.3  None % Final    Lymphocytes Automated 05/12/2019 15.4  None % Final    Monocytes 05/12/2019 8.7  None % Final    Eosinophils Automated 05/12/2019 0.1  None % Final    Basophils Automated 05/12/2019 0.3  None % Final    Immature Granulocytes 05/12/2019 4.2  None % Final    Nucleated RBC 05/12/2019 0.2* 0.0 - 0.0 /100 WBC Final    Neutrophils Absolute 05/12/2019 6.75* 1.10 - 6.33 x10 3/uL Final    Lymphocytes Absolute Automated 05/12/2019 1.46  0.42 - 3.22 x10 3/uL Final    Monocytes Absolute Automated 05/12/2019 0.82  0.21 - 0.85 x10 3/uL Final    Eosinophils Absolute Automated 05/12/2019 0.01  0.00 - 0.44 x10 3/uL Final    Basophils Absolute Automated 05/12/2019 0.03  0.00 - 0.08 x10 3/uL Final    Immature Granulocytes Absolute 05/12/2019 0.40* 0.00 - 0.07 x10 3/uL Final    Absolute NRBC 05/12/2019 0.02* 0.00 - 0.00 x10 3/uL Final    Glucose Piccolo(R) 05/12/2019 101* 70 - 100 mg/dL Final    BUN Piccolo(R) 05/12/2019 11.0  7.0 - 19.0 mg/dL Final    Creatinine Piccolo(R) 05/12/2019 0.9  0.4 - 1.5 mg/dL Final  Sodium Piccolo(R) 05/12/2019 137  136 - 145 mEq/L Final    Potassium Piccolo(R) 05/12/2019 3.9  3.5 - 5.1 mEq/L Final    Chloride Piccolo(R) 05/12/2019 100  98 - 107 mEq/L Final    CO2 Piccolo(R) 05/12/2019 30* 21 - 29 mEq/L Final    Calcium Piccolo(R) 05/12/2019 9.2  8.5 - 10.5 mg/dL Final    Protein Piccolo(R) 05/12/2019 6.8  6.4 - 8.1 g/dL Final    Albumin Piccolo(R) 05/12/2019 3.6  3.3 - 5.0 g/dL Final    AST Piccolo(R) 05/12/2019 <25  11 - 38 U/L Final    ALT Piccolo(R) 05/12/2019 <20  10 - 47 U/L Final    Alkaline Phosphatase Piccolo(R) 05/12/2019 70  42 - 141 U/L Final    Bilirubin Total Piccolo(R) 05/12/2019 0.4  0.2 - 1.6 mg/dL Final    GFR Piccolo(R) 05/12/2019 >60.0   Final           Assessment / Plan:      This patient's case was discussed with Dr. Ladell Heads    1. Right arm, neck, and chest wall swelling  - hight risk for DVT  - STAT venous Doppler of right upper extremity to r/o DVT    2. Chest pain  - STAT Chest CT angiogram to r/o PE    3. Breast cancer  - delay cycle 4 ddAC one week    4. DVT of right upper extremity  - Start Lovenox 120 mg SQ BID - Rx [facility] to the [facility]  ** pharmacy has only 10 syringes in stock  - Patient instructed to return for more Lovenox and avoid interruption of treatment    As always, patient was instructed to seek medical care with temperature 100.4 or higher and/or any other concerning S&S. Patient verbalized understanding.    Jovita Gamma, NP    1600 Addendum    DVT of right upper extremity  - Start Lovenox 120 mg SQ BID - Rx sent to the [facility] pharmacy  ** pharmacy has only 10 syringes in stock  - Patient instructed to return for more Lovenox and avoid interruption of treatment      Venous doppler positive for DVT  IMPRESSION:    Occlusive deep venous thrombosis within the right internal  jugular, innominate and subclavian veins.    Critical results were discussed with and acknowledged by Jovita Gamma, DNP, FNP at 12:20 PM on 05/26/2019..    The patient  was instructed to return to the infusion center.    Mills Koller, MD   05/26/2019 12:35 PM    CTA is negative for PE    IMPRESSION:     1. No pulmonary embolism identified.  2. Enlargement and stranding along the inferior right jugular vein,  right innominate and subclavian veins consistent with venous thrombosis  as demonstrated on Doppler ultrasound exam.  3. 4 mm right lower lobe pulmonary nodule, unchanged since abdomen  pelvis CT 11/12/2015, most compatible with benign etiology.    COMMENT: This urgent result was discussed with and acknowledged by Jovita Gamma, NP, at 1507 hours on 05/26/2019.    Kennyth Lose, MD   05/26/2019 3:10 PM

## 2019-05-30 ENCOUNTER — Telehealth: Payer: Self-pay | Admitting: Genetic Counselor

## 2019-05-30 ENCOUNTER — Encounter: Payer: Self-pay | Admitting: Internal Medicine

## 2019-05-30 ENCOUNTER — Telehealth (INDEPENDENT_AMBULATORY_CARE_PROVIDER_SITE_OTHER): Payer: Commercial Managed Care - POS | Admitting: Family Medicine

## 2019-05-30 ENCOUNTER — Encounter (INDEPENDENT_AMBULATORY_CARE_PROVIDER_SITE_OTHER): Payer: Self-pay | Admitting: Family Medicine

## 2019-05-30 DIAGNOSIS — C50412 Malignant neoplasm of upper-outer quadrant of left female breast: Secondary | ICD-10-CM

## 2019-05-30 DIAGNOSIS — F325 Major depressive disorder, single episode, in full remission: Secondary | ICD-10-CM

## 2019-05-30 DIAGNOSIS — I82621 Acute embolism and thrombosis of deep veins of right upper extremity: Secondary | ICD-10-CM | POA: Insufficient documentation

## 2019-05-30 DIAGNOSIS — I10 Essential (primary) hypertension: Secondary | ICD-10-CM

## 2019-05-30 DIAGNOSIS — Z171 Estrogen receptor negative status [ER-]: Secondary | ICD-10-CM

## 2019-05-30 MED ORDER — LOSARTAN POTASSIUM-HCTZ 100-25 MG PO TABS
1.0000 | ORAL_TABLET | Freq: Every day | ORAL | 1 refills | Status: DC
Start: 2019-05-30 — End: 2019-10-26

## 2019-05-30 NOTE — Telephone Encounter (Signed)
Leslie [name] and I met o nmarch 15, 2021 to discuss the option of genetic testing. She opted to proceed with testing via a saliva sample. The kit was sent to her, but the sample has not been received at the testing company, [employer] I sent [name] a follow up email to check the status of genetic testing and to see if I could answer any question or concerns she may have.     Roderic Ovens, MS, CGC  Licensed, Counselling psychologist [facility]  [place] [facility]  [address]  [place] [address]  T [phone]  F [phone]

## 2019-05-30 NOTE — Progress Notes (Signed)
[facility] [place]                       Date of Virtual Visit: 05/30/2019 10:57 AM        Patient ID: Leslie [name] is a 49 y.o. female.  Attending Physician: Satira Mccallum, MD       Telemedicine Eligibility:    State Location:  [x]  [place]  []  [place]  []  [place] []  [place]  []  Other:    Physical Location:  [x]  Home  []         []        []          []  Other:    Patient Identity Verification:  [x]  State Issued ID  []  Insurance Eligibility Check  []  Other:    Physical Address Verification: (for 911)  [x]  Yes  []  No    Personal identity shared with patient:  [x]  Yes  []  No    Education on nature of video visit shared with patient:  [x]  Yes  []  No    Emergency plan agreed upon with patient:  [x]  Yes  []  No    If the patient had not had this virtual visit, what would they have done?  []         []         []        []          []  Other:    Visit terminated since not appropriate for virtual care:  [x]  N/A  []  Reason:         Chief Complaint:    Chief Complaint   Patient presents with    Chemotherapy     consult prior to starting               HPI:    Patient consents to bill insurance for video visit    1.48 yo F presents for a need to consult with PCP about  Chemotherapy  Getting from oncologist right now      2. DVT R upper arm dxed 05/26/19  On lovenox now for 10 days  Reviewed note    3. Htn: stable on med    4. Obesity: mom is cooking cardiac diet  Walking when she can    5. Depression : resolved. 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 40 tablet 0    enoxaparin (Lovenox) 120 MG/0.8ML Solution Inject 0.8 mLs (120 mg total) into the skin  every 12 (twelve) hours 60 Syringe 1    ergocalciferol (ERGOCALCIFEROL) 1.25 MG (50000 UT) capsule Take 1 capsule (50,000 Units total) by mouth once a week 12 capsule 1    lidocaine-prilocaine (EMLA) cream Apply topically as needed (one hour prior to chemotherapy appointment) 30 g 3    losartan-hydrochlorothiazide (HYZAAR) 100-25 MG per tablet Take 1 tablet by mouth daily 90 tablet 1    OLANZapine (ZyPREXA) 2.5 MG tablet Take one tab at bedtime days 1,2,3, and 4 of each chemotherapy cycle 16 tablet 0    ondansetron (Zofran ODT) 8 MG disintegrating tablet Take 1 tablet (8 mg total) by mouth every 8 (eight) hours as needed for Nausea 60 tablet 3    prochlorperazine (COMPAZINE) 10 MG tablet Take 1 tablet (10 mg total) by mouth every 6 (six) hours as needed (nausea) 30 tablet 3    tiZANidine (Zanaflex) 4 MG tablet One tab every 8 hrs as needed sparingly to relax muscles 30 tablet 0    [DISCONTINUED] losartan-hydrochlorothiazide (HYZAAR) 100-25 MG per tablet Take 1 tablet by mouth daily 90 tablet 0          Allergies:    Allergies   Allergen Reactions    Valsartan Other (See Comments)     achiness    Penicillins Hives     Symptoms occurred as child  Onset date: 07-28-2004             Past Surgical History:    Past Surgical History:   Procedure Laterality Date    HYSTERECTOMY  2008    fibroids, readmitted for pelvic abscess formation POD#3    MEDIPORT PLACEMENT N/A 04/08/2019    Procedure: MEDIPORT PLACEMENT;  Surgeon: Pollyann Kennedy, MD;  Location: [facility];  Service:  Interventional Radiology;  Laterality: N/A;    OTHER SURGICAL HISTORY  05/04/2015    Obalon balloon - have 3 balloons placed in stomach for weight loss, Dr. Georgann Housekeeper    SALPINGOOPHORECTOMY Right 2008    RSO with hysterectomy    TUBAL LIGATION  02/17/1997           Family History:    Family History   Problem Relation Age of Onset    Hypertension Mother     Coronary artery disease Mother     Heart disease Mother     Hyperlipidemia Mother     Hypertension Father     Diabetes Father     Other Father         cardiac catheterization    Hypertension Sister     Breast cancer Maternal Aunt     Uterine cancer Maternal Aunt     Vaginal cancer Maternal Aunt     Breast cancer Other         maternal cousin    Ovarian cancer Neg Hx            Social History:    Social History     Socioeconomic History    Marital status: Legally Separated     Spouse name: Not on file    Number of children: Not on file    Years of education: Not on file    Highest education level: Not on file   Occupational History    Not on file   Tobacco Use    Smoking status: Never Smoker    Smokeless tobacco: Never Used   Substance and Sexual Activity    Alcohol use: Yes     Comment: wine - social, 2-4  times a month    Drug use: No    Sexual activity: Yes     Partners: Male   Other Topics Concern    Not on file   Social History Narrative    Has elliptical machine        Lives with boyfriend, daughter home        Son died on 2019-01-17 in CCU            Mom staying with her, good support system 05/30/2019     Social Determinants of Health     Financial Resource Strain:     Difficulty of Paying Living Expenses:    Food Insecurity:     Worried About Programme researcher, broadcasting/film/video in the Last Year:     Barista in the Last Year:    Transportation Needs:     Freight forwarder (Medical):     Lack of Transportation (Non-Medical):    Physical Activity: Insufficiently Active    Days of Exercise per Week: 7 days    Minutes of  Exercise per Session: 20 min   Stress:     Feeling of Stress :    Social Connections:     Frequency of Communication with Friends and Family:     Frequency of Social Gatherings with Friends and Family:     Attends Religious Services:     Active Member of Clubs or Organizations:     Attends Banker Meetings:     Marital Status:    Intimate Partner Violence:     Fear of Current or Ex-Partner:     Emotionally Abused:     Physically Abused:     Sexually Abused:                   Vital Signs:    There were no vitals taken for this visit.         ROS:    Review of Systems   No sob, no cp now        Physical Exam:    Physical Exam   GENERAL APPEARANCE: alert, in no acute distress, pleasant, well nourished.   HEAD: normal appearance  EYES: no discharge  EARS: normal hearing  NECK/THYROID: appearance -supple  PSYCH: appropriate affect, appropriate mood, normal speech, normal attention        Assessment:    1. Essential hypertension  - losartan-hydrochlorothiazide (HYZAAR) 100-25 MG per tablet; Take 1 tablet by mouth daily  Dispense: 90 tablet; Refill: 1    2. Morbid obesity    3. Malignant neoplasm of upper-outer quadrant of left breast in female, estrogen receptor negative    4. Acute deep vein thrombosis (DVT) of right upper extremity, unspecified vein    5. Major depressive disorder with single episode, in full remission            Plan:    Recommend covid vaccine and get if ok with oncologist  F/u with oncologist for dvt and cancer tx  Continue current medicines  Side effect of meds discussed  Do healthy , high fiber, low cholesterol, low carb diet, reminded allowed food list and protein key for healing  Exercise most days  Plan for wellness visit every year  Follow up visit with [facility] in 3-6 months          Follow-up:    Return in about 3 months (around 08/29/2019) for chronic care follow up, high blood  pressure, weight, cancer if pt is open.         Satira Mccallum, MD

## 2019-05-31 ENCOUNTER — Ambulatory Visit (INDEPENDENT_AMBULATORY_CARE_PROVIDER_SITE_OTHER): Payer: Commercial Managed Care - POS | Admitting: Surgery

## 2019-05-31 ENCOUNTER — Telehealth (HOSPITAL_BASED_OUTPATIENT_CLINIC_OR_DEPARTMENT_OTHER): Payer: Self-pay

## 2019-05-31 ENCOUNTER — Encounter (HOSPITAL_BASED_OUTPATIENT_CLINIC_OR_DEPARTMENT_OTHER): Payer: Self-pay | Admitting: Surgery

## 2019-05-31 ENCOUNTER — Other Ambulatory Visit: Payer: Self-pay | Admitting: Internal Medicine

## 2019-05-31 VITALS — BP 118/82 | HR 119 | Temp 97.3°F | Wt 299.0 lb

## 2019-05-31 DIAGNOSIS — C50912 Malignant neoplasm of unspecified site of left female breast: Secondary | ICD-10-CM

## 2019-05-31 NOTE — Progress Notes (Signed)
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 she has been having side effects and doing well otherwise. She is here for midchemo follow up. Set to complete chemo July 15th    She initially presented after BILATERAL mammogramon Jan 2021and noted LEFT breast 3.1 x 2cm hypoechoic mass and mildly enlarged axillarynode abnormality. She underwent a CNB of the LEFT breast 2:00 mass and node- LEFT breast mass was IDC ER/PR/HER2 negative , node was benign.    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, estrogen receptor negative     04/14/2019 -  Chemotherapy    cyclophosphamide (CYTOXAN) 1,500 mg in sodium chloride 0.9 % 360 mL chemo infusion, 1,554 mg, Intravenous, Once, 2 of 4 cycles  Administration: 1,500 mg (04/14/2019), 1,500 mg (04/28/2019)  pegfilgrastim (NEULASTA) injection 6 mg, 6 mg, Subcutaneous, Once, 2 of 2 cycles  Administration: 6 mg (04/14/2019), 6 mg (04/29/2019)  pegfilgrastim (NEULASTA) delivery kit 6 mg, 6 mg, Subcutaneous, Once, 1 of 3 cycles  Administration: 6 mg (04/28/2019)  DOXOrubicin (ADRIAMYCIN) 155 mg in sodium chloride 0.9 % 362.5 mL chemo infusion, 60 mg/m2 = 155 mg, Intravenous, Once, 2 of 4 cycles  Administration: 155 mg (04/14/2019), 155 mg (04/28/2019)     06/06/2019 -  Chemotherapy    CARBOplatin (PARAPLATIN) 224.25 mg in sodium chloride 0.9 % 272.43 mL chemo infusion, 224.25 mg, Intravenous, Once, 0 of 4 cycles  PACLitaxel (TAXOL) 210 mg in sodium chloride 0.9 % 285 mL chemo infusion, 80 mg/m2, Intravenous, Once, 0 of 4 cycles         Obstetric History  OB History   Gravida Para  Term Preterm AB Living   3 3 3  0 0 2   SAB TAB Ectopic Multiple Live Births   0 0 0 0 2       Past Medical History:   Diagnosis Date    Fibroid     Herpes simplex without mention of complication 2003    possible recurrance in 2013    Hypertension     Lymphedema of extremity 11/03/2017    Neurologic disorder     brachial neuritis    Obesity     Urinary tract infection     Vitamin D deficiency    :    Past Surgical History:   Procedure Laterality Date    HYSTERECTOMY  2008    fibroids, readmitted for pelvic abscess formation POD#3    MEDIPORT PLACEMENT N/A 04/08/2019    Procedure: MEDIPORT PLACEMENT;  Surgeon: Leslie Kennedy, MD;  Location: [facility];  Service: Interventional Radiology;  Laterality: N/A;    OTHER SURGICAL HISTORY  05/04/2015    [place] balloon - have 3 balloons placed in stomach for weight loss, Dr. Georgann [name]    SALPINGOOPHORECTOMY Right 2008    RSO with hysterectomy    TUBAL LIGATION  02/17/1997   :      Current Outpatient Medications:     dexamethasone (DECADRON) 4 MG tablet, Take 8 mg (2 tabs) on days 2, 3, and 4 for  breakfast and days 3 and 4 for lunch of each chemotherapy cycle., Disp: 40 tablet, Rfl: 0    enoxaparin (Lovenox) 120 MG/0.8ML Solution, Inject 0.8 mLs (120 mg total) into the skin every 12 (twelve) hours, Disp: 60 Syringe, Rfl: 1    ergocalciferol (ERGOCALCIFEROL) 1.25 MG (50000 UT) capsule, Take 1 capsule (50,000 Units total) by mouth once a week, Disp: 12 capsule, Rfl: 1    lidocaine-prilocaine (EMLA) cream, Apply topically as needed (one hour prior to chemotherapy appointment), Disp: 30 g, Rfl: 3    losartan-hydrochlorothiazide (HYZAAR) 100-25 MG per tablet, Take 1 tablet by mouth daily, Disp: 90 tablet, Rfl: 1    OLANZapine (ZyPREXA) 2.5 MG tablet, Take one tab at bedtime days 1,2,3, and 4 of each chemotherapy cycle, Disp: 16 tablet, Rfl: 0    ondansetron (Zofran ODT) 8 MG disintegrating tablet, Take 1 tablet (8 mg total) by mouth every 8 (eight) hours as needed  for Nausea, Disp: 60 tablet, Rfl: 3    prochlorperazine (COMPAZINE) 10 MG tablet, Take 1 tablet (10 mg total) by mouth every 6 (six) hours as needed (nausea), Disp: 30 tablet, Rfl: 3    tiZANidine (Zanaflex) 4 MG tablet, One tab every 8 hrs as needed sparingly to relax muscles, Disp: 30 tablet, Rfl: 0    Allergies:  Valsartan and Penicillins    Family History:  Her family history includes Breast cancer in her maternal aunt and another family member; Coronary artery disease in her mother; Diabetes in her father; Heart disease in her mother; Hyperlipidemia in her mother; Hypertension in her father, mother, and sister; Other in her father; Uterine cancer in her maternal aunt; Vaginal cancer in her maternal aunt.    Social History:  Social History     Socioeconomic History    Marital status: Legally Separated     Spouse name: None    Number of children: None    Years of education: None    Highest education level: None   Occupational History    None   Tobacco Use    Smoking status: Never Smoker    Smokeless tobacco: Never Used   Substance and Sexual Activity    Alcohol use: Yes     Comment: wine - social, 2-4 times a month    Drug use: No    Sexual activity: Yes     Partners: Male   Other Topics Concern    None   Social History Narrative    Has elliptical machine        Lives with boyfriend, daughter home        Son died on Jan 29, 2019 in CCU            Mom staying with her, good support system 05/30/2019     Social Determinants of Health     Financial Resource Strain:     Difficulty of Paying Living Expenses:    Food Insecurity:     Worried About Programme researcher, broadcasting/film/video in the Last Year:     Barista in the Last Year:    Transportation Needs:     Freight forwarder (Medical):     Lack of Transportation (Non-Medical):    Physical Activity: Insufficiently Active    Days of Exercise per Week: 7 days    Minutes of Exercise per Session: 20 min   Stress:     Feeling of Stress :    Social Connections:      Frequency of Communication with Friends  and Family:     Frequency of Social Gatherings with Friends and Family:     Attends Religious Services:     Active Member of Clubs or Organizations:     Attends Engineer, structural:     Marital Status:    Intimate Partner Violence:     Fear of Current or Ex-Partner:     Emotionally Abused:     Physically Abused:     Sexually Abused:        Review of Systems  Review of Systems   Constitutional: Positive for fatigue.   HENT: Negative.    Eyes: Negative.    Respiratory: Negative.    Cardiovascular: Negative.    Gastrointestinal: Positive for abdominal pain.   Endocrine: Negative.    Genitourinary: Negative.    Musculoskeletal: Positive for gait problem.   Skin: Negative.    Allergic/Immunologic: Negative.    Hematological: Negative.    Psychiatric/Behavioral: Negative.        Physical Exam  BP 118/82 (BP Site: Left arm, Patient Position: Sitting, Cuff Size: Large)    Pulse (!) 119    Temp 97.3 F (36.3 C) (Temporal)    Wt 135.6 kg (299 lb)    SpO2 99%    BMI 44.13 kg/m   Breast: Bilateral breast symmetric. Ptosis Grade 3. RIGHT Breast: No palpable masses, no skin changes, no nipple inversion/retraction/discharge, no adenopathy LEFT Breast: No palpable masses, no skin changes, no nipple inversion/retraction/discharge, no adenopathy.       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 Clinically StageII (cT2N0M0).      Plan:   Post NAC MRI  Plastics for consultation  Follow up with me after completion of NAC for surgical planning   Call prior if any issues      Dairl Ponder. Tye Savoy, MD    Dairl Ponder. Tye Savoy MD  Surgeon, [facility]  [facility] Department of Surgery    05/31/2019 4:16 PM    No images are attached to the encounter.    No Recipients

## 2019-06-02 ENCOUNTER — Ambulatory Visit (HOSPITAL_BASED_OUTPATIENT_CLINIC_OR_DEPARTMENT_OTHER): Payer: Commercial Managed Care - POS | Admitting: Family

## 2019-06-02 ENCOUNTER — Other Ambulatory Visit: Payer: Self-pay | Admitting: Internal Medicine

## 2019-06-02 ENCOUNTER — Ambulatory Visit: Payer: Commercial Managed Care - POS | Attending: Internal Medicine

## 2019-06-02 ENCOUNTER — Telehealth (HOSPITAL_BASED_OUTPATIENT_CLINIC_OR_DEPARTMENT_OTHER): Payer: Self-pay

## 2019-06-02 ENCOUNTER — Encounter (HOSPITAL_BASED_OUTPATIENT_CLINIC_OR_DEPARTMENT_OTHER): Payer: Self-pay

## 2019-06-02 DIAGNOSIS — I82621 Acute embolism and thrombosis of deep veins of right upper extremity: Secondary | ICD-10-CM

## 2019-06-02 DIAGNOSIS — T82524A Displacement of infusion catheter, initial encounter: Secondary | ICD-10-CM

## 2019-06-02 DIAGNOSIS — Z5111 Encounter for antineoplastic chemotherapy: Secondary | ICD-10-CM

## 2019-06-02 DIAGNOSIS — C50412 Malignant neoplasm of upper-outer quadrant of left female breast: Secondary | ICD-10-CM

## 2019-06-02 DIAGNOSIS — Z171 Estrogen receptor negative status [ER-]: Secondary | ICD-10-CM

## 2019-06-02 DIAGNOSIS — C50919 Malignant neoplasm of unspecified site of unspecified female breast: Secondary | ICD-10-CM

## 2019-06-02 MED ORDER — APIXABAN 5 MG PO TABS
5.0000 mg | ORAL_TABLET | Freq: Two times a day (BID) | ORAL | 3 refills | Status: DC
Start: 2019-06-02 — End: 2019-06-02

## 2019-06-02 MED ORDER — APIXABAN 5 MG PO TABS
5.0000 mg | ORAL_TABLET | Freq: Two times a day (BID) | ORAL | 3 refills | Status: DC
Start: 2019-06-02 — End: 2019-06-30

## 2019-06-02 NOTE — Telephone Encounter (Signed)
[facility] Social Worker Case Manager   06/02/2019    Follow up Note:     SW received referral from [facility] Clydie Braun. Patient voiced financial concerns/ financial toxicity with her increase medical bills and current employment status.     Patient is single and is the only provider to herself.     Patient has also had medical complications with her diagnosis resulting in hospital stays, interventional procedures and more medications. She expressed major concerns with high stress levels of worry over these costs of increasing medical bills.     SW reached out to patient today, she was on her way home from treatment, which resulted in another worry as she is having another issue with her port site.     SW did quickly the above information with the patient. SW explained to the patient that she would send over some information/ resources for her to review today when she gets home. We can schedule a good time to talk tomorrow. Patient was glad to hear that. Looking forward to conversation tomorrow.   SW included in email: Breast Cancer Financial resources, [place] charity/patient assistance application.     Linus Orn, MSW   [facility] Social Worker Case Manager   [phone]

## 2019-06-02 NOTE — Progress Notes (Signed)
PROGRESS NOTE      Patient Name: Leslie [name], Leslie [name]      CC/HPI:   HPI     CC: F/u DVT right upper extremity     Justus N Dukeman with breast cancer presents to the infusion clinic for Cycle 4, Day 1 DDAC    Oncologist: A. Ladell Heads, MD    Ms. Taila LEWELLYN STANDLEY is  well-appearing and not in distress. She reports resolution of pain and swelling of her right arm, right chest wall, and right neck. Her chest pain and dizziness have resolved as well. She is feeling well today.   She reports nausea provoked by Lovenox injections. She offers no other concerns or complaint.  " Port accessed, when flushing port with saline pt complained of stinging at port site and while flushing, a small bubble starting forming above needle insertion site. Needle taken out and reaccess attempted. Same result with small bubble forming with saline and pain at site."    Patient denies cough, fever, chills, palpitations, GI/GU symptoms, change in lower extremity swelling (patient with history of LE lymphedema).    Review of Systems     Review of Systems   Constitutional: Negative for chills, fever and malaise/fatigue.   Respiratory: Negative for cough and shortness of breath.    Cardiovascular: Negative for chest pain, palpitations and leg swelling.   Gastrointestinal: Negative.    Genitourinary: Negative.    Musculoskeletal: Negative.    Skin: Negative.    Neurological: Negative for dizziness, speech change, weakness and headaches.   Psychiatric/Behavioral: Negative.           Medications:       Current Outpatient Medications:     apixaban (ELIQUIS) 5 MG, Take 1 tablet (5 mg total) by mouth every 12 (twelve) hours, Disp: 60 tablet, Rfl: 3    dexamethasone (DECADRON) 4 MG tablet, Take 8 mg (2 tabs) on days 2, 3, and 4 for breakfast and days 3 and 4 for lunch of each chemotherapy cycle., Disp: 40 tablet, Rfl: 0    ergocalciferol (ERGOCALCIFEROL) 1.25 MG (50000 UT) capsule, Take 1 capsule (50,000 Units total) by mouth once a week, Disp: 12 capsule,  Rfl: 1    lidocaine-prilocaine (EMLA) cream, Apply topically as needed (one hour prior to chemotherapy appointment), Disp: 30 g, Rfl: 3    losartan-hydrochlorothiazide (HYZAAR) 100-25 MG per tablet, Take 1 tablet by mouth daily, Disp: 90 tablet, Rfl: 1    OLANZapine (ZyPREXA) 2.5 MG tablet, Take one tab at bedtime days 1,2,3, and 4 of each chemotherapy cycle, Disp: 16 tablet, Rfl: 0    ondansetron (Zofran ODT) 8 MG disintegrating tablet, Take 1 tablet (8 mg total) by mouth every 8 (eight) hours as needed for Nausea, Disp: 60 tablet, Rfl: 3    prochlorperazine (COMPAZINE) 10 MG tablet, Take 1 tablet (10 mg total) by mouth every 6 (six) hours as needed (nausea), Disp: 30 tablet, Rfl: 3    tiZANidine (Zanaflex) 4 MG tablet, One tab every 8 hrs as needed sparingly to relax muscles, Disp: 30 tablet, Rfl: 0    Past History:     Past Medical History:   Diagnosis Date    Fibroid     Herpes simplex without mention of complication 2003    possible recurrance in 2013    Hypertension     Lymphedema of extremity 11/03/2017    Neurologic disorder     brachial neuritis    Obesity     Urinary tract infection  Vitamin D deficiency          Physical Exam:       Physical Exam  Vitals reviewed.   Constitutional:       General: She is not in acute distress.  Cardiovascular:      Rate and Rhythm: Normal rate and regular rhythm.      Heart sounds: Normal heart sounds.   Pulmonary:      Effort: Pulmonary effort is normal. No tachypnea.      Breath sounds: Normal breath sounds and air entry. No decreased breath sounds, wheezing or rales.   Musculoskeletal:      Right upper arm: Normal. No swelling.      Left upper arm: Normal. No swelling.      Cervical back: Normal. No edema.   Lymphadenopathy:      Cervical: No cervical adenopathy.   Skin:     General: Skin is warm and dry.   Neurological:      Mental Status: She is alert and oriented to person, place, and time.   Psychiatric:         Mood and Affect: Mood normal.          Labs:     No visits with results within 1 Day(s) from this visit.   Latest known visit with results is:   Infusion on 05/12/2019   Component Date Value Ref Range Status    WBC 05/12/2019 9.47  3.10 - 9.50 x10 3/uL Final    Hgb 05/12/2019 10.4* 11.4 - 14.8 g/dL Final    Hematocrit 59/56/3875 31.9* 34.7 - 43.7 % Final    Platelets 05/12/2019 140* 142 - 346 x10 3/uL Final    RBC 05/12/2019 3.63* 3.90 - 5.10 x10 6/uL Final    MCV 05/12/2019 87.9  78.0 - 96.0 fL Final    MCH 05/12/2019 28.7  25.1 - 33.5 pg Final    MCHC 05/12/2019 32.6  31.5 - 35.8 g/dL Final    RDW 64/33/2951 14  11 - 15 % Final    MPV 05/12/2019 9.8  8.9 - 12.5 fL Final    Neutrophils 05/12/2019 71.3  None % Final    Lymphocytes Automated 05/12/2019 15.4  None % Final    Monocytes 05/12/2019 8.7  None % Final    Eosinophils Automated 05/12/2019 0.1  None % Final    Basophils Automated 05/12/2019 0.3  None % Final    Immature Granulocytes 05/12/2019 4.2  None % Final    Nucleated RBC 05/12/2019 0.2* 0.0 - 0.0 /100 WBC Final    Neutrophils Absolute 05/12/2019 6.75* 1.10 - 6.33 x10 3/uL Final    Lymphocytes Absolute Automated 05/12/2019 1.46  0.42 - 3.22 x10 3/uL Final    Monocytes Absolute Automated 05/12/2019 0.82  0.21 - 0.85 x10 3/uL Final    Eosinophils Absolute Automated 05/12/2019 0.01  0.00 - 0.44 x10 3/uL Final    Basophils Absolute Automated 05/12/2019 0.03  0.00 - 0.08 x10 3/uL Final    Immature Granulocytes Absolute 05/12/2019 0.40* 0.00 - 0.07 x10 3/uL Final    Absolute NRBC 05/12/2019 0.02* 0.00 - 0.00 x10 3/uL Final    Glucose Piccolo(R) 05/12/2019 101* 70 - 100 mg/dL Final    BUN Piccolo(R) 05/12/2019 11.0  7.0 - 19.0 mg/dL Final    Creatinine Piccolo(R) 05/12/2019 0.9  0.4 - 1.5 mg/dL Final    Sodium Piccolo(R) 05/12/2019 137  136 - 145 mEq/L Final    Potassium Piccolo(R) 05/12/2019 3.9  3.5 - 5.1  mEq/L Final    Chloride Piccolo(R) 05/12/2019 100  98 - 107 mEq/L Final    CO2 Piccolo(R) 05/12/2019 30*  21 - 29 mEq/L Final    Calcium Piccolo(R) 05/12/2019 9.2  8.5 - 10.5 mg/dL Final    Protein Piccolo(R) 05/12/2019 6.8  6.4 - 8.1 g/dL Final    Albumin Piccolo(R) 05/12/2019 3.6  3.3 - 5.0 g/dL Final    AST Piccolo(R) 05/12/2019 <25  11 - 38 U/L Final    ALT Piccolo(R) 05/12/2019 <20  10 - 47 U/L Final    Alkaline Phosphatase Piccolo(R) 05/12/2019 70  42 - 141 U/L Final    Bilirubin Total Piccolo(R) 05/12/2019 0.4  0.2 - 1.6 mg/dL Final    GFR Piccolo(R) 05/12/2019 >60.0   Final           Assessment / Plan:      This patient's case was discussed with Dr. Ladell Heads    1. DVT of right upper extremity  - Sweling and pain resolved  - Ok to stop Lovenox to switch to Eliquis 5 mg BID  - Eliquis sent to the [facility] pharmacy    2. Suspect MediPort dislodgement  - Mediport check scheduled at [facility] on 4/21   -patient declined appointment with [facility] today    3. Chest pain, resolved    4. Breast cancer  - delay cycle 4 ddAC onther week  -f/u with [name], PA on 4/19    As always, patient was instructed to seek medical care with temperature 100.4 or higher and/or any other concerning S&S. Patient verbalized understanding.    Jovita Gamma, NP

## 2019-06-02 NOTE — Progress Notes (Signed)
Pt of Dr. Ladell Heads here for C4 DDAC. VSS.    Patient complains of fatigue today. She was held last week due to blood clot in R arm. Pt has been on Lovenox twice daily and complained of nausea during the week. Pt switching to eliquis twice daily today.    Port accessed, when flushing port with saline pt complained of stinging at port site and while flushing, a small bubble starting forming above needle insertion site. Needle taken out and reaccess attempted. Same result with small bubble forming with saline and pain at site. Needle removed, [name] NP assessed pt.     Pt to have mediport checked on 4/21 and resume for treatment 4/22. Pt did not want to be peripherally accessed, states she is a very hard stick.    appts up to date, pt left as she came and advised to reach out to MD office for any concerns or questions.

## 2019-06-02 NOTE — Progress Notes (Signed)
Followed up with patient in infusion. Her mother was with her. Pt was sitting in chair, waiting to hear if she would need to go to [facility] to address issue with her port. She stated she was feeling "disappointed" because of port problem last week, diagnostic procedures she had to go through, taking Lovenox and, possibly not being able to have her last dose of DDAC today. Patient talked about work, taking time w/out pay, using FMLA, Short-term disability and concern about medical bills "piling up." Discussed her concerns about "financial toxicity." Explained she could apply for [facility] for high out-of-pocket hospital expenses and community resources for other expenses. Provided empathetic listening, validated her concerns and problem solved with her. Discussed with [facility] CM Danielle Dinardo who stated she would contact patient to discuss further. Plan to follow up, support and assist as needed.    Sadie Haber, MSW, LCSW, OSW-C  Oncology Clinical Therapist 2  [facility]  [phone]

## 2019-06-03 ENCOUNTER — Ambulatory Visit: Payer: Commercial Managed Care - POS

## 2019-06-03 ENCOUNTER — Telehealth (HOSPITAL_BASED_OUTPATIENT_CLINIC_OR_DEPARTMENT_OTHER): Payer: Self-pay

## 2019-06-03 NOTE — Telephone Encounter (Signed)
[facility] Social Worker Case Manager   06/03/2019    Follow up Note:     Social Worker spoke with patient today via phone. We spoke about her financial concerns on her medical bills. Patient stated she is not working and is out on [employer]. She will starting on short term disability with her job at the end of the month. Unsure of her income amount.     She is the only provider to herself. She has 1 living adult daughter who is as supportive as possible. 1 son who as passed away in 2019-01-18.     Patient did confirm that she received email yesterday from SW. She appreciated the information. We spoke about [place] financial assistance. Patient would like to apply and will complete the application.   SW offered additional support such as food and transportation. Patient stated at this time, she is ok with those things. Her mother is providing transportation for her to treatment at [facility].   Patient appreciates the time and information- SW will met with patient during her next infusion appointment.       Plan:   SW made referral to financial navigators to reach out to patient to discuss application.    SW will remain available as needed.     Linus Orn, MSW   [facility] Social Worker Case Manager   [phone]

## 2019-06-06 ENCOUNTER — Encounter: Payer: Self-pay | Admitting: Internal Medicine

## 2019-06-08 ENCOUNTER — Ambulatory Visit
Admission: RE | Admit: 2019-06-08 | Discharge: 2019-06-08 | Disposition: A | Discharge status: Home / Self Care | Payer: Commercial Managed Care - POS | Source: Ambulatory Visit | Attending: Diagnostic Radiology | Admitting: Diagnostic Radiology

## 2019-06-08 ENCOUNTER — Encounter
Admission: RE | Disposition: A | Discharge status: Home / Self Care | Payer: Self-pay | Source: Ambulatory Visit | Attending: Diagnostic Radiology

## 2019-06-08 DIAGNOSIS — Z7901 Long term (current) use of anticoagulants: Secondary | ICD-10-CM | POA: Insufficient documentation

## 2019-06-08 DIAGNOSIS — Z452 Encounter for adjustment and management of vascular access device: Secondary | ICD-10-CM | POA: Insufficient documentation

## 2019-06-08 DIAGNOSIS — Z853 Personal history of malignant neoplasm of breast: Secondary | ICD-10-CM | POA: Insufficient documentation

## 2019-06-08 DIAGNOSIS — Z5111 Encounter for antineoplastic chemotherapy: Secondary | ICD-10-CM

## 2019-06-08 DIAGNOSIS — Z79899 Other long term (current) drug therapy: Secondary | ICD-10-CM | POA: Insufficient documentation

## 2019-06-08 DIAGNOSIS — I890 Lymphedema, not elsewhere classified: Secondary | ICD-10-CM | POA: Insufficient documentation

## 2019-06-08 DIAGNOSIS — E559 Vitamin D deficiency, unspecified: Secondary | ICD-10-CM | POA: Insufficient documentation

## 2019-06-08 DIAGNOSIS — B009 Herpesviral infection, unspecified: Secondary | ICD-10-CM | POA: Insufficient documentation

## 2019-06-08 DIAGNOSIS — I10 Essential (primary) hypertension: Secondary | ICD-10-CM | POA: Insufficient documentation

## 2019-06-08 DIAGNOSIS — Z86718 Personal history of other venous thrombosis and embolism: Secondary | ICD-10-CM | POA: Insufficient documentation

## 2019-06-08 DIAGNOSIS — Z7952 Long term (current) use of systemic steroids: Secondary | ICD-10-CM | POA: Insufficient documentation

## 2019-06-08 HISTORY — PX: MEDIPORT CHECK: IMG2598

## 2019-06-08 LAB — COVID-19 (SARS-COV-2): SARS CoV 2 Overall Result: NEGATIVE

## 2019-06-08 SURGERY — MEDIPORT CHECK
Site: Chest | Laterality: Right

## 2019-06-08 MED ORDER — LIDOCAINE 1% BUFFERED - CNR/OUTSOURCED
INTRAMUSCULAR | Status: AC
Start: 2019-06-08 — End: ?
  Filled 2019-06-08: qty 22

## 2019-06-08 MED ORDER — HEPARIN SOD (PORK) LOCK FLUSH 100 UNIT/ML IV SOLN
INTRAVENOUS | Status: AC
Start: 2019-06-08 — End: ?
  Filled 2019-06-08: qty 5

## 2019-06-08 MED ORDER — ALTEPLASE 2 MG IJ SOLR
2.00 mg | Freq: Once | INTRAMUSCULAR | Status: AC
Start: 2019-06-08 — End: 2019-06-08
  Administered 2019-06-08: 16:00:00 2 mg
  Filled 2019-06-08: qty 2

## 2019-06-08 NOTE — Progress Notes (Signed)
2mg  TPA instilled in port access line. curos caps and biopatch in place, dressing cdi, pt denies pain. Port access line labelled with notice that line has TPA instilled. Per Dr Jaynie Collins, pt to be discharged with TPA indwelling for infusion tomorrow.

## 2019-06-08 NOTE — Progress Notes (Addendum)
Pt reports to IR for right chest mediport check. Pt denies pain. Describes they are unable to draw back blood and skin "puckered" when flushed last. Pt describes port was last used at the beginning of the month.   Pt consented with Dr Jaynie Collins

## 2019-06-08 NOTE — Progress Notes (Signed)
Discharge instructions explained to pt, states understanding and denies questions. Pt transported off unit in stable condition with transporter with all belongings.

## 2019-06-08 NOTE — Progress Notes (Signed)
Pt to IR room 1 for mediport check/dye study. Procedural consent obtained from pt by Dr Jaynie Collins and placed on pt's chart. Pt positioned and draped appropriately.     Mediport accessed by this RN with 19G, 1in needle. No blood return noted, but able to flush mediport with saline without issue. Pt denies pain or discomfort with flushing. Per Dr Jaynie Collins, large amount of clot seen around mediport and needs to be removed, with new one placed to left side of chest. Pt made aware of findings and understands plan of care. Plan to flush line with 2mg  of TPA to see if able to use for chemo treatment tomorrow (4/22). Mediport left accessed. Dressing placed over top of mediport access site. Dressing is CDI, no oozing or bleeding noted. Biopatch and curos caps in place per protocol.     Pt tolerated procedure well, denies pain post procedure, VSS. Pt taken back to IR recovery, report given to [facility]. Margaret aware that pt to have line flushed with TPA in recovery bay prior to discharge. Pt provided with snack and juice per request, denies additional needs at this time.

## 2019-06-09 ENCOUNTER — Other Ambulatory Visit: Payer: Self-pay | Admitting: Internal Medicine

## 2019-06-09 ENCOUNTER — Ambulatory Visit: Payer: Commercial Managed Care - POS

## 2019-06-09 ENCOUNTER — Encounter (HOSPITAL_BASED_OUTPATIENT_CLINIC_OR_DEPARTMENT_OTHER): Payer: Self-pay

## 2019-06-09 ENCOUNTER — Ambulatory Visit: Payer: Commercial Managed Care - POS | Attending: Internal Medicine

## 2019-06-09 ENCOUNTER — Telehealth: Payer: Self-pay | Admitting: Internal Medicine

## 2019-06-09 VITALS — BP 145/88 | HR 102 | Temp 97.6°F | Resp 18 | Wt 304.2 lb

## 2019-06-09 DIAGNOSIS — Z5111 Encounter for antineoplastic chemotherapy: Secondary | ICD-10-CM

## 2019-06-09 DIAGNOSIS — Z171 Estrogen receptor negative status [ER-]: Secondary | ICD-10-CM | POA: Insufficient documentation

## 2019-06-09 DIAGNOSIS — C50412 Malignant neoplasm of upper-outer quadrant of left female breast: Secondary | ICD-10-CM | POA: Insufficient documentation

## 2019-06-09 DIAGNOSIS — Z5112 Encounter for antineoplastic immunotherapy: Secondary | ICD-10-CM | POA: Insufficient documentation

## 2019-06-09 LAB — CBC AND DIFFERENTIAL
Absolute NRBC: 0 10*3/uL (ref 0.00–0.00)
Basophils Absolute Automated: 0.04 10*3/uL (ref 0.00–0.08)
Basophils Automated: 0.7 %
Eosinophils Absolute Automated: 0.09 10*3/uL (ref 0.00–0.44)
Eosinophils Automated: 1.6 %
Hematocrit: 31.1 % — ABNORMAL LOW (ref 34.7–43.7)
Hgb: 10.1 g/dL — ABNORMAL LOW (ref 11.4–14.8)
Immature Granulocytes Absolute: 0.01 10*3/uL (ref 0.00–0.07)
Immature Granulocytes: 0.2 %
Lymphocytes Absolute Automated: 1.04 10*3/uL (ref 0.42–3.22)
Lymphocytes Automated: 18.2 %
MCH: 29.2 pg (ref 25.1–33.5)
MCHC: 32.5 g/dL (ref 31.5–35.8)
MCV: 89.9 fL (ref 78.0–96.0)
MPV: 9.8 fL (ref 8.9–12.5)
Monocytes Absolute Automated: 0.72 10*3/uL (ref 0.21–0.85)
Monocytes: 12.6 %
Neutrophils Absolute: 3.83 10*3/uL (ref 1.10–6.33)
Neutrophils: 66.7 %
Nucleated RBC: 0 /100 WBC (ref 0.0–0.0)
Platelets: 183 10*3/uL (ref 142–346)
RBC: 3.46 10*6/uL — ABNORMAL LOW (ref 3.90–5.10)
RDW: 17 % — ABNORMAL HIGH (ref 11–15)
WBC: 5.73 10*3/uL (ref 3.10–9.50)

## 2019-06-09 LAB — RRL - CMP
ALT Piccolo(R): 33 U/L (ref 10–47)
AST Piccolo(R): 26 U/L (ref 11–38)
Albumin Piccolo(R): 3.6 g/dL (ref 3.3–5.0)
Alkaline Phosphatase Piccolo(R): 66 U/L (ref 42–141)
BUN Piccolo(R): 11 mg/dL (ref 7.0–19.0)
Bilirubin Total Piccolo(R): 0.4 mg/dL (ref 0.2–1.6)
CO2 Piccolo(R): 29 mEq/L (ref 21–29)
Calcium Piccolo(R): 9 mg/dL (ref 8.5–10.5)
Chloride Piccolo(R): 104 mEq/L (ref 98–107)
Creatinine Piccolo(R): 0.9 mg/dL (ref 0.4–1.5)
Glucose Piccolo(R): 155 mg/dL — ABNORMAL HIGH (ref 70–100)
Potassium Piccolo(R): 3.8 mEq/L (ref 3.5–5.1)
Protein Piccolo(R): 7.3 g/dL (ref 6.4–8.1)
Sodium Piccolo(R): 132 mEq/L — ABNORMAL LOW (ref 136–145)

## 2019-06-09 LAB — GFR: GFR Piccolo(R): >60

## 2019-06-09 MED ORDER — DOXORUBICIN HCL 2 MG/ML IV SOLN
60.00 mg/m2 | Freq: Once | INTRAVENOUS | Status: AC
Start: 2019-06-09 — End: 2019-06-09
  Administered 2019-06-09: 12:00:00 155 mg via INTRAVENOUS
  Filled 2019-06-09: qty 77.5

## 2019-06-09 MED ORDER — PALONOSETRON HCL 0.25 MG/5ML IV SOLN
250.00 ug | Freq: Once | INTRAVENOUS | Status: AC
Start: 2019-06-09 — End: 2019-06-09
  Administered 2019-06-09: 12:00:00 0.25 mg via INTRAVENOUS
  Filled 2019-06-09: qty 5

## 2019-06-09 MED ORDER — DEXAMETHASONE IVPB 12 MG IN NS 50 ML
12.00 mg | Freq: Once | INTRAVENOUS | Status: AC
Start: 2019-06-09 — End: 2019-06-09
  Administered 2019-06-09: 12:00:00 12 mg via INTRAVENOUS
  Filled 2019-06-09: qty 50

## 2019-06-09 MED ORDER — APREPITANT 130 MG/18ML IV EMUL
130.00 mg | Freq: Once | INTRAVENOUS | Status: AC
Start: 2019-06-09 — End: 2019-06-09
  Administered 2019-06-09: 12:00:00 130 mg via INTRAVENOUS
  Filled 2019-06-09: qty 18

## 2019-06-09 MED ORDER — SODIUM CHLORIDE 0.9 % IV SOLN
1500.00 mg | Freq: Once | INTRAVENOUS | Status: AC
Start: 2019-06-09 — End: 2019-06-09
  Administered 2019-06-09: 13:00:00 1500 mg via INTRAVENOUS
  Filled 2019-06-09: qty 50

## 2019-06-09 MED ORDER — PEGFILGRASTIM 6 MG/0.6ML SC PSKT
6.00 mg | PREFILLED_SYRINGE | Freq: Once | SUBCUTANEOUS | Status: AC
Start: 2019-06-09 — End: 2019-06-09
  Administered 2019-06-09: 14:00:00 6 mg via SUBCUTANEOUS
  Filled 2019-06-09: qty 0.6

## 2019-06-09 NOTE — Telephone Encounter (Signed)
Please assist patient to schedule Mediport replacement.  Thank you.

## 2019-06-09 NOTE — Progress Notes (Signed)
PROGRESS NOTE        Brief Note     Patient Name: Leslie [name], Leslie [name]      CC: F/u MediPort dye study    Leslie [name] with breast cancer presents to the infusion clinic for Cycle 4, Day 1 DDAC    Oncologist: A. Ladell Heads, MD    Leslie [name] is  well-appearing and not in distress.  She had her port evaluated yesterday. The port was found to have pericatheter thrombus that extends to the tip of the port. She had TPA placed by IR and allowed to dwell until her appointment today.  Today, patient reports feeling tired but no other complaints.  RN Trinna Post accessed the port without difficulties and noted good blood aspiration. Leslie [name] tolerated the port access without pain or discomfort.  Patient is fearful to have her port replaced to the left side due to close proximity to the heart. She is inquiring regarding a PICC line. We discussed disadvantages of the PICC and I explained that the risk to develop clots with PICC is similar. The central line infection with PICC lines are higher than with MediPorts. I reassured that since she is now on Eliquis, it is less likely that she develops clots.   She reports complete resolution of pain and swelling of her right arm, right chest wall, and right neck.   Patient denies cough, fever, chills, palpitations, GI/GU symptoms, change in lower extremity swelling (patient with history of LE lymphedema).   She offers no other concerns or complaints.    Review of Systems     Review of Systems   Constitutional: Positive for malaise/fatigue. Negative for chills and fever.   Respiratory: Negative for cough and shortness of breath.    Cardiovascular: Negative for chest pain, palpitations and leg swelling.   Gastrointestinal: Negative.    Genitourinary: Negative.    Musculoskeletal: Negative.    Skin: Negative.    Neurological: Negative for dizziness, speech change, weakness and headaches.   Psychiatric/Behavioral: Negative.          Assessment / Plan:      This patient's case was discussed  with Dr. Ladell Heads    1. Pericatheter thrombus of MediPort dislodgement  - Mediport with good blood aspiration   - Ok to use port for Chemo treatment today  - Patient prefers PICC instead of another port  - Patient to discuss with Dr. Ladell Heads    2. DVT of right upper extremity  - Sweling and pain resolved  - Continue with  Eliquis     3. Breast cancer  Taxane based chemotherapy course is planned to be started on 5/6      As always, patient was instructed to seek medical care with temperature 100.4 or higher and/or any other concerning S&S. Patient verbalized understanding.    Jovita Gamma, NP

## 2019-06-09 NOTE — Progress Notes (Signed)
[facility]                                                       Nurse Navigator Visit    Diagnosis:  Breast Cancer    Present for Visit:   Patient, Linus Orn, Case Manager, and myself    Location of Visit:  [facility]    Reason for Visit:  Patient aware of [facility] services. Introduced myself and the nurse navigator role.     Patient's needs/concerns:  Financial: Danielle Dinardo providing resources for patient.     Recommended Programs/ Groups:  -Breast Cancer Support Group  -Caregiver Connection    Plan: Follow up as needed. Left my contact information.  Encouraged patient to reach out at any time with further questions or concerns.     Peter Congo, RN, BSN, OCN  Oncology Nurse Navigator  [place] [facility] [place], [place]  [phone]

## 2019-06-09 NOTE — Progress Notes (Signed)
[facility] Social Worker Case Manager   06/09/2019    Follow up Note:     Social worker met with patient today during her scheduled infusion. We spoke about Breast Cancer financial community foundation resources. Social Worker encouraged patient to apply to as many as she can, that are open and available.     SW also went over advance directive information, present the patient with educational information on Health Care Surrogate and Your Right to [place].Hand outs provided.     Patient also completed her Magazine features editor- SW copied application and supporting documents during visit.     SW emailed all FA documents to Financial Navigator to assist with assistance for patient on her account. Hoping this will help alleviate some of her medical financials toxicity. Allowing her to focus on her emotional and physical well-being.     Patient has Social worker's contact information and is free to reach out at anytime with any questions or concerns.     SW will remain available as needed.     FN- team will continue to follow the patient to secure all documents have been received for her financial assistance application process.     Linus Orn, MSW   [facility] Social Worker Case Manager   [phone]

## 2019-06-09 NOTE — Progress Notes (Signed)
[name]   Visit Date: 06/09/2019    C4D1 DDAC   *pt was seen by IR yesterday to assess port, large clot was found around mediport. IR was unable to get blood return from her port, therefore instilled TPA. Today, blood return was noted from port and is able to flush without pain from patient.   *reivewed with [name], ok to proceed with treatment from Dr. Ladell Heads.   *per IR, pt should have R chest port removed and L one put in place for further treatments     Leslie [name] is a 49 y.o. female patient of [name], MD    Since her last visit, she has been doing well.   Overall, she states her energy level is good.  The patient is able to perform most usual functions.. Her appetite is reported to be intact, with no significant changes in weight.. She reports fatigue today. She denies N/V/D/C/SOB or chest pain. Her last DDAC was 3/25.  She has no other concerns. There are no social work or other referral needs at this time.    Diagnosis: Breast Cancer  Treatment on Clinical Trial: Not On Study (NOS)  [facility](O) - OP Adult - Breast - Dose Dense DOXOrubicin + Cyclophosphamide (DDAC) Q 14 Days x 4 Cycles (Followed by Taxane course)  Pre-Medications: dexamethasone, cinvanti and aloxi     Line Type: Mediport,    Right Port  Blood Return verified prior to administration: Yes    Vital Signs:    Patient Vitals for the past 12 hrs:   BP Temp Pulse Resp   06/09/19 1057 145/88 97.6 F (36.4 C) (!) 102 18      Body surface area is 2.59 meters squared.    '  Chemistry        Component Value Date/Time    NA 132 (L) 06/09/2019 1112    K 3.8 06/09/2019 1112    CL 104 06/09/2019 1112    CO2 29 06/09/2019 1112    BUN 11.0 06/09/2019 1112    CREAT 0.9 06/09/2019 1112    GLU 155 (H) 06/09/2019 1112        Component Value Date/Time    CA 9.0 06/09/2019 1112    ALKPHOS 82 04/14/2019 1600    AST 26 06/09/2019 1112    ALT 33 06/09/2019 1112    BILITOTAL 0.4 04/14/2019 1600            Lab Results   Component Value  Date    WBC 5.73 06/09/2019    HGB 10.1 (L) 06/09/2019    PLT 183 06/09/2019    NEUTROABS 3.83 06/09/2019           Chemo Given within the past 12 hours:   "Chemo/Biotherapy Given" (last 12 hours)     Date/Time Action User Medication Dose Rate Dose Volume (mL) Rate    06/09/19 1415 Given    AD pegfilgrastim (NEULASTA) delivery kit 6 mg 6 mg        06/09/19 1410 Stopped    AD cyclophosphamide (CYTOXAN) 1,500 mg in sodium chloride 0.9 % 360 mL chemo infusion  0 mL/hr       06/09/19 1310 New Bag    AD cyclophosphamide (CYTOXAN) 1,500 mg in sodium chloride 0.9 % 360 mL chemo infusion 1,500 mg 360 mL/hr       06/09/19 1308 Stopped    AD DOXOrubicin (ADRIAMYCIN) 155 mg in sodium chloride 0.9 % 362.5 mL chemo infusion  0  mL/hr       06/09/19 1208 New Bag    AD DOXOrubicin (ADRIAMYCIN) 155 mg in sodium chloride 0.9 % 362.5 mL chemo infusion 155 mg 363 mL/hr               Hypersensitivity Reaction Noted: No  IV Post Hydration: No, not required  Treatment Outcome:Patient tolerated treatment well. Central Line flushed and deacessed. OBI placed on pts L arm, pt verbalized when to remove.    Education: Patient/Family educated regarding the expected outcomes/side effects possible interactions of medications. Patient verbalizes understanding.    Follow-up Plan: Discharged in stable condition. Accompanied by, mother. Instructed to contact physician if any complications or unmanageable symptoms occur.    A return to the Infusion Center for the next treatment, has been scheduled.      Alicia Amel, RN    06/09/2019  2:20 PM

## 2019-06-10 ENCOUNTER — Encounter: Payer: Self-pay | Admitting: Internal Medicine

## 2019-06-10 SURGERY — PICC LINE PLACEMENT
Site: Arm Upper

## 2019-06-10 NOTE — Telephone Encounter (Signed)
[employer] called and states they are needing office notes from Dr. Duanne Moron office. Gave Dr. Duanne Moron number [phone]

## 2019-06-13 ENCOUNTER — Encounter: Payer: Self-pay | Admitting: Internal Medicine

## 2019-06-13 ENCOUNTER — Other Ambulatory Visit: Payer: Self-pay | Admitting: Hematology & Oncology

## 2019-06-15 ENCOUNTER — Telehealth: Payer: Self-pay | Admitting: Internal Medicine

## 2019-06-15 ENCOUNTER — Encounter: Payer: Self-pay | Admitting: Internal Medicine

## 2019-06-15 ENCOUNTER — Other Ambulatory Visit: Payer: Self-pay | Admitting: Internal Medicine

## 2019-06-15 DIAGNOSIS — Z5111 Encounter for antineoplastic chemotherapy: Secondary | ICD-10-CM

## 2019-06-15 DIAGNOSIS — Z171 Estrogen receptor negative status [ER-]: Secondary | ICD-10-CM

## 2019-06-15 MED ORDER — LORAZEPAM 0.5 MG PO TABS
0.50 mg | ORAL_TABLET | Freq: Four times a day (QID) | ORAL | 0 refills | Status: AC | PRN
Start: 2019-06-15 — End: 2019-06-30

## 2019-06-15 NOTE — Telephone Encounter (Signed)
PICC Line scheduled   May 4th at 2:00PM at [facility].

## 2019-06-15 NOTE — Telephone Encounter (Signed)
Please assist patient with appointment for PICC line placement.  Thank you.

## 2019-06-16 ENCOUNTER — Ambulatory Visit: Payer: Commercial Managed Care - POS

## 2019-06-17 ENCOUNTER — Other Ambulatory Visit: Payer: Self-pay

## 2019-06-17 DIAGNOSIS — Z171 Estrogen receptor negative status [ER-]: Secondary | ICD-10-CM

## 2019-06-17 DIAGNOSIS — C50412 Malignant neoplasm of upper-outer quadrant of left female breast: Secondary | ICD-10-CM

## 2019-06-21 ENCOUNTER — Encounter: Payer: Self-pay | Admitting: Internal Medicine

## 2019-06-21 DIAGNOSIS — R112 Nausea with vomiting, unspecified: Secondary | ICD-10-CM

## 2019-06-21 DIAGNOSIS — Z5111 Encounter for antineoplastic chemotherapy: Secondary | ICD-10-CM

## 2019-06-22 ENCOUNTER — Encounter (HOSPITAL_BASED_OUTPATIENT_CLINIC_OR_DEPARTMENT_OTHER): Payer: Self-pay

## 2019-06-22 ENCOUNTER — Other Ambulatory Visit: Payer: Self-pay | Admitting: Internal Medicine

## 2019-06-22 ENCOUNTER — Ambulatory Visit
Admission: RE | Admit: 2019-06-22 | Payer: Commercial Managed Care - POS | Source: Ambulatory Visit | Admitting: Diagnostic Radiology

## 2019-06-22 ENCOUNTER — Encounter: Admission: RE | Payer: Self-pay | Source: Ambulatory Visit

## 2019-06-22 ENCOUNTER — Telehealth: Payer: Self-pay | Admitting: Internal Medicine

## 2019-06-22 ENCOUNTER — Encounter: Payer: Self-pay | Admitting: Internal Medicine

## 2019-06-22 DIAGNOSIS — C50412 Malignant neoplasm of upper-outer quadrant of left female breast: Secondary | ICD-10-CM

## 2019-06-22 DIAGNOSIS — Z5111 Encounter for antineoplastic chemotherapy: Secondary | ICD-10-CM

## 2019-06-22 SURGERY — MEDIPORT REMOVAL
Anesthesia: Conscious Sedation

## 2019-06-22 SURGERY — PICC LINE PLACEMENT
Anesthesia: Local

## 2019-06-22 MED ORDER — OLANZAPINE 5 MG PO TABS
ORAL_TABLET | ORAL | 0 refills | Status: DC
Start: 2019-06-22 — End: 2019-11-28

## 2019-06-22 MED ORDER — HEPARIN SOD (PORK) LOCK FLUSH 100 UNIT/ML IV SOLN
INTRAVENOUS | Status: AC
Start: 2019-06-22 — End: ?
  Filled 2019-06-22: qty 5

## 2019-06-22 NOTE — Progress Notes (Signed)
[facility]  Oncology Nurse Navigator Phone Call      Diagnosis:  Breast Cancer    Reason for Call:  Referred by Linus Orn, Case Manager to assist with financial needs.    Documents Emailed:  -Checklist for Applying to [employer]  -Breast Cancer Financial Resource Document    Plan: Patient does not have a printer at home. Will print out applications and coordinate with patient to submit applications.    Peter Congo, RN, BSN, OCN  Oncology Nurse Navigator  [place] [facility] [place], [place]  [phone]

## 2019-06-22 NOTE — Progress Notes (Signed)
Patient came to the IR dept today at 1pm for an appointment set up when her port was not giving blood return. TPA was instilled on 4/21 in IR dept and dwelled overnight with needle in place for her chemo appointment on 4/22. Patient reported during her appointment they were able to get blood return from the port and her chemo was infused in the normal fashion via port without complaints or complication. Dr Jaynie Collins verified with Dr Ladell Heads if port is functional, it may stay in place for the rest of her chemo sessions. Patient would like to keep port if it is fully functional.     Patient was brought into the IR dept to access the port to make sure port was still patent. A needle was placed to port but was not in the right place, pain with flush and no blood return. Patient agreed to try another attempt with a different needle and was successful in placement, had blood return and flushed easily. Port was locked with heparin and deaccessed, her next chemo appointment is tomorrow. Patient ambulated out to the lobby without complaints or concerns at this time.

## 2019-06-22 NOTE — Telephone Encounter (Signed)
Please schedule follow up with Leslie [name] in the next 1 week.   Patient last seen on 05/09/2019 in tele visit.   Patient has canceled her last appointment as out of town. We can't proceed anymore with chemo if she does not see a provider ASAP.

## 2019-06-22 NOTE — Progress Notes (Signed)
[facility] Social Worker Case Manager   06/22/2019    Follow up Note:     SW met with patient and her daughter today- she return to provided SW with tax documents that Financial navigators needed to help complete her charity application for submission for review.     SW secured the documents and emailed them over to [place] as requested.     Patient was returned her original copies.      Linus Orn, MSW   [facility] Social Worker Case Manager   [phone]

## 2019-06-23 ENCOUNTER — Encounter: Payer: Self-pay | Admitting: Internal Medicine

## 2019-06-23 ENCOUNTER — Ambulatory Visit: Payer: Commercial Managed Care - POS | Attending: Internal Medicine

## 2019-06-23 ENCOUNTER — Telehealth: Payer: Self-pay | Admitting: Internal Medicine

## 2019-06-23 ENCOUNTER — Encounter (HOSPITAL_BASED_OUTPATIENT_CLINIC_OR_DEPARTMENT_OTHER): Payer: Self-pay

## 2019-06-23 ENCOUNTER — Ambulatory Visit: Payer: Commercial Managed Care - POS

## 2019-06-23 ENCOUNTER — Telehealth: Payer: Self-pay | Admitting: Genetic Counselor

## 2019-06-23 VITALS — BP 141/89 | HR 111 | Temp 98.6°F | Resp 18

## 2019-06-23 DIAGNOSIS — Z171 Estrogen receptor negative status [ER-]: Secondary | ICD-10-CM

## 2019-06-23 DIAGNOSIS — C50412 Malignant neoplasm of upper-outer quadrant of left female breast: Secondary | ICD-10-CM | POA: Insufficient documentation

## 2019-06-23 DIAGNOSIS — Z5111 Encounter for antineoplastic chemotherapy: Secondary | ICD-10-CM | POA: Insufficient documentation

## 2019-06-23 LAB — CBC AND DIFFERENTIAL
Absolute NRBC: 0 10*3/uL (ref 0.00–0.00)
Basophils Absolute Automated: 0.02 10*3/uL (ref 0.00–0.08)
Basophils Automated: 0.4 %
Eosinophils Absolute Automated: 0.05 10*3/uL (ref 0.00–0.44)
Eosinophils Automated: 0.9 %
Hematocrit: 32.5 % — ABNORMAL LOW (ref 34.7–43.7)
Hgb: 10.6 g/dL — ABNORMAL LOW (ref 11.4–14.8)
Immature Granulocytes Absolute: 0.04 10*3/uL (ref 0.00–0.07)
Immature Granulocytes: 0.7 %
Lymphocytes Absolute Automated: 1.38 10*3/uL (ref 0.42–3.22)
Lymphocytes Automated: 24.7 %
MCH: 29.9 pg (ref 25.1–33.5)
MCHC: 32.6 g/dL (ref 31.5–35.8)
MCV: 91.8 fL (ref 78.0–96.0)
MPV: 9.6 fL (ref 8.9–12.5)
Monocytes Absolute Automated: 0.71 10*3/uL (ref 0.21–0.85)
Monocytes: 12.7 %
Neutrophils Absolute: 3.39 10*3/uL (ref 1.10–6.33)
Neutrophils: 60.6 %
Nucleated RBC: 0 /100 WBC (ref 0.0–0.0)
Platelets: 160 10*3/uL (ref 142–346)
RBC: 3.54 10*6/uL — ABNORMAL LOW (ref 3.90–5.10)
RDW: 17 % — ABNORMAL HIGH (ref 11–15)
WBC: 5.59 10*3/uL (ref 3.10–9.50)

## 2019-06-23 LAB — RRL - CMP
ALT Piccolo(R): <20 U/L (ref 10–47)
AST Piccolo(R): <25 U/L (ref 11–38)
Albumin Piccolo(R): 3.6 g/dL (ref 3.3–5.0)
Alkaline Phosphatase Piccolo(R): 76 U/L (ref 42–141)
BUN Piccolo(R): 12 mg/dL (ref 7.0–19.0)
Bilirubin Total Piccolo(R): 0.4 mg/dL (ref 0.2–1.6)
CO2 Piccolo(R): 29 mEq/L (ref 21–29)
Calcium Piccolo(R): 9.6 mg/dL (ref 8.5–10.5)
Chloride Piccolo(R): 104 mEq/L (ref 98–107)
Creatinine Piccolo(R): 0.9 mg/dL (ref 0.4–1.5)
Glucose Piccolo(R): 141 mg/dL — ABNORMAL HIGH (ref 70–100)
Potassium Piccolo(R): 3.8 mEq/L (ref 3.5–5.1)
Protein Piccolo(R): 7.6 g/dL (ref 6.4–8.1)
Sodium Piccolo(R): 140 mEq/L (ref 136–145)

## 2019-06-23 LAB — GFR: GFR Piccolo(R): >60

## 2019-06-23 MED ORDER — SODIUM CHLORIDE (PF) 0.9 % IJ SOLN
5.00 mL | INTRAMUSCULAR | Status: DC | PRN
Start: 2019-06-23 — End: 2019-06-23

## 2019-06-23 MED ORDER — SODIUM CHLORIDE 0.9 % IV SOLN
20.00 mg | Freq: Once | INTRAVENOUS | Status: AC
Start: 2019-06-23 — End: 2019-06-23
  Administered 2019-06-23: 12:00:00 20 mg via INTRAVENOUS
  Filled 2019-06-23: qty 2

## 2019-06-23 MED ORDER — CARBOPLATIN 10 MG/ML IV SOLN (WRAP)
225.00 mg | Freq: Once | INTRAVENOUS | Status: AC
Start: 2019-06-23 — End: 2019-06-23
  Administered 2019-06-23: 14:00:00 225 mg via INTRAVENOUS
  Filled 2019-06-23: qty 22.5

## 2019-06-23 MED ORDER — CARBOPLATIN 10 MG/ML IV SOLN (WRAP)
287.00 mg | Freq: Once | INTRAVENOUS | Status: DC
Start: 2019-06-23 — End: 2019-06-23

## 2019-06-23 MED ORDER — SODIUM CHLORIDE 0.9 % IV EXCEL
80.00 mg/m2 | Freq: Once | INTRAVENOUS | Status: AC
Start: 2019-06-23 — End: 2019-06-23
  Administered 2019-06-23: 13:00:00 210 mg via INTRAVENOUS
  Filled 2019-06-23: qty 35

## 2019-06-23 MED ORDER — DIPHENHYDRAMINE HCL 50 MG/ML IJ SOLN
25.00 mg | Freq: Once | INTRAMUSCULAR | Status: AC
Start: 2019-06-23 — End: 2019-06-23
  Administered 2019-06-23: 13:00:00 25 mg via INTRAVENOUS
  Filled 2019-06-23: qty 1

## 2019-06-23 MED ORDER — APREPITANT 130 MG/18ML IV EMUL
130.00 mg | Freq: Once | INTRAVENOUS | Status: AC
Start: 2019-06-23 — End: 2019-06-23
  Administered 2019-06-23: 13:00:00 130 mg via INTRAVENOUS
  Filled 2019-06-23: qty 18

## 2019-06-23 MED ORDER — HEPARIN SOD (PORK) LOCK FLUSH 100 UNIT/ML IV SOLN
5.00 mL | INTRAVENOUS | Status: DC | PRN
Start: 2019-06-23 — End: 2019-06-23
  Administered 2019-06-23: 16:00:00 5 mL
  Filled 2019-06-23: qty 5

## 2019-06-23 MED ORDER — FAMOTIDINE 10 MG/ML IV SOLN (WRAP)
20.00 mg | Freq: Once | INTRAVENOUS | Status: AC
Start: 2019-06-23 — End: 2019-06-23
  Administered 2019-06-23: 13:00:00 20 mg via INTRAVENOUS
  Filled 2019-06-23: qty 2

## 2019-06-23 MED ORDER — ONDANSETRON IVPB 16 MG IN NS 50 ML
16.00 mg | Freq: Once | INTRAVENOUS | Status: AC
Start: 2019-06-23 — End: 2019-06-23
  Administered 2019-06-23: 12:00:00 16 mg via INTRAVENOUS
  Filled 2019-06-23: qty 50

## 2019-06-23 NOTE — Telephone Encounter (Signed)
[facility] called. This patient is starting her Taxol/Carbo today. She did not take her decadron this morning. She states it upsets her stomach. The RN will give it IV today. What would you like to order for cycle 2? Thanks!

## 2019-06-23 NOTE — Progress Notes (Signed)
Provided printed application for [employer]  Will Coordinate with patient regarding submitting documents.     Peter Congo, RN, BSN, OCN  Oncology Nurse Navigator  [place] [facility] [place], [place]  [phone]

## 2019-06-23 NOTE — Telephone Encounter (Signed)
Leslie [name] and I met on May 02, 2019 to discuss genetic testing. At the time, she opted to pursue genetic testing. A saliva kit was sent to her house. As of Jun 23, 2019, her saliva sample has not yet been received. I had contacted Leslie [name] in April to see if she was planning to submit the sample. She noted that treatment had been making her ill, and she would try to submit the sample when she was able. Her chart will be closed at this time since the sample has not been received, yet. She remains an appropriate and reasonable candidate for testing. Should she submit her sample, I would be happy to discuss the results with her.     Roderic Ovens, MS, CGC  Licensed, Counselling psychologist [facility]  [place] [facility]  [facility]  [address]  [place] [address]  T [phone]  F [phone]

## 2019-06-23 NOTE — Telephone Encounter (Signed)
Patient scheduled at [facility]  5/17 at 11:30AM

## 2019-06-23 NOTE — Telephone Encounter (Signed)
Per Dr. Ladell Heads please help patient schedule [place] left breast. Thank you.

## 2019-06-23 NOTE — Telephone Encounter (Signed)
Per Dr. Ladell Heads - yes 20 mg IV on day of chemotherapy. juts make sure the nurse document she was advised to take oral steroids and decided not to do it so she is at increased risk of allergic reation    Relayed to infusion nurse.

## 2019-06-23 NOTE — Progress Notes (Signed)
[facility]   Visit Date: 06/23/2019        Leslie [name] is a 49 y.o. female patient of [name], MD    Since her last visit, she has been doing fair.   Patient presents to the infusion center for Cycle1, Day1 Taxol/ Carboplatin.     She is post DDAC and had terrible nausea during that regimen. She is understandably anxious when here after dealing with blood clots and multiple IR issues with her port. Her port today had good blood return with a 1 inch needle. They did not place a picc line since the port was working She did not have a recent teaching session with the TC and was not informed to take decadron. She says decadron does cause her significant abdominal pain. Since she did not have a reaction today, she does not need to take po dex for next week and we will give her 20 mg IV dex during the infusion. The IV benadryl caused restless leg and dizziness but informed patient she will only need po next week.  * Confirms compazine and zofran for home regimen  * Taking eliquis for the clots surrounding her port line  * Cryotherapy for hands and feet    Diagnosis: Breast Cancer  Treatment on Clinical Trial: Not On Study (NOS)  [facility](O) - OP Adult - Breast Neoadjuvant - PACLitaxel + CARBOplatin (D1,8,15) Q 21 Days X 4 Cycles    Pre-Medications: aprepitant IV (Cinvanti), dexamethasone (Decadron), diphenhydramine (Benadryl), famotidine (Pepcid) and ondansetron (Zofran)    Line Type: Mediport,    Right Port  Blood Return verified prior to administration: Yes    Vital Signs:    Patient Vitals for the past 12 hrs:   BP Temp Pulse Resp   06/23/19 1107 141/89 98.6 F (37 C) (!) 111 18        '  Chemistry        Component Value Date/Time    NA 140 06/23/2019 1133    K 3.8 06/23/2019 1133    CL 104 06/23/2019 1133    CO2 29 06/23/2019 1133    BUN 12.0 06/23/2019 1133    CREAT 0.9 06/23/2019 1133    GLU 141 (H) 06/23/2019 1133        Component Value Date/Time    CA 9.6 06/23/2019 1133    ALKPHOS 82  04/14/2019 1600    AST <25 06/23/2019 1133    ALT <20 06/23/2019 1133    BILITOTAL 0.4 04/14/2019 1600            Lab Results   Component Value Date    WBC 5.59 06/23/2019    HGB 10.6 (L) 06/23/2019    PLT 160 06/23/2019    NEUTROABS 3.39 06/23/2019       Administrations This Visit     aprepitant (CINVANTI) injection 130 mg     Admin Date  06/23/2019  12:34 Action  Given Dose  130 mg Route  Intravenous Ordering Provider  [name], MD          CARBOplatin (PARAPLATIN) 225 mg in sodium chloride 0.9 % 307.5 mL chemo infusion     Admin Date  06/23/2019  14:25 Action  New Bag Dose  225 mg Rate  308 mL/hr Route  Intravenous Ordering Provider  [name], MD          dexAMETHasone (DECADRON) 20 mg in sodium chloride 0.9 % 50 mL IVPB     Admin Date  06/23/2019  12:02 Action  New Bag Dose  20 mg Rate  200 mL/hr Route  Intravenous Ordering Provider  [name], MD          diphenhydrAMINE (BENADRYL) injection 25 mg     Admin Date  06/23/2019  12:38 Action  Given Dose  25 mg Route  Intravenous Ordering Provider  [name], MD          famotidine (PEPCID) injection 20 mg     Admin Date  06/23/2019  12:36 Action  Given Dose  20 mg Route  Intravenous Ordering Provider  [name], MD          heparin 100 UNIT/ML flush 5 mL     Admin Date  06/23/2019  15:30 Action  Given Dose  5 mL Route  Intracatheter Ordering Provider  [name], MD          ondansetron [facility]) 16 mg in sodium chloride 0.9 % 50 mL IVPB (premix)     Admin Date  06/23/2019  12:18 Action  New Bag Dose  16 mg Rate  200 mL/hr Route  Intravenous Ordering Provider  [name], MD          PACLitaxel (TAXOL) 210 mg in sodium chloride 0.9 % 320 mL chemo infusion     Admin Date  06/23/2019  13:20 Action  New Bag Dose  210 mg Rate  320 mL/hr Route  Intravenous Ordering Provider  [name], MD                Chemo Given within the past 12 hours:   "Chemo/Biotherapy Given" (last 12 hours)     Date/Time Action User Medication Dose  Rate Dose Volume (mL) Rate    06/23/19 1530 Given    CM heparin 100 UNIT/ML flush 5 mL 5 mL        06/23/19 1525 Stopped    CM CARBOplatin (PARAPLATIN) 225 mg in sodium chloride 0.9 % 307.5 mL chemo infusion  0 mL/hr       06/23/19 1425 New Bag    MC CARBOplatin (PARAPLATIN) 225 mg in sodium chloride 0.9 % 307.5 mL chemo infusion 225 mg 308 mL/hr       06/23/19 1420 Stopped    CM PACLitaxel (TAXOL) 210 mg in sodium chloride 0.9 % 320 mL chemo infusion  0 mL/hr       06/23/19 1320 New Bag    MC PACLitaxel (TAXOL) 210 mg in sodium chloride 0.9 % 320 mL chemo infusion 210 mg 320 mL/hr               Hypersensitivity Reaction Noted: No  Treatment Outcome:Patient tolerated treatment well. Central Line flushed and deacessed.    Education: Patient/Family educated regarding the expected outcomes/side effects possible interactions of treatments and medications. Patient verbalizes understanding.    Follow-up Plan: Discharged in stable condition. Accompanied by, daughter. Instructed to contact physician if any complications or unmanageable symptoms occur.        Denny Levy, RN    06/23/2019  4:37 PM

## 2019-06-27 ENCOUNTER — Encounter: Payer: Self-pay | Admitting: Internal Medicine

## 2019-06-28 ENCOUNTER — Encounter: Payer: Self-pay | Admitting: Internal Medicine

## 2019-06-29 ENCOUNTER — Encounter: Payer: Self-pay | Admitting: Physician Assistant

## 2019-06-29 ENCOUNTER — Ambulatory Visit: Payer: Commercial Managed Care - POS | Attending: Physician Assistant | Admitting: Physician Assistant

## 2019-06-29 VITALS — BP 129/83 | HR 112 | Temp 97.0°F | Resp 20 | Ht 69.0 in | Wt 296.1 lb

## 2019-06-29 DIAGNOSIS — E860 Dehydration: Secondary | ICD-10-CM

## 2019-06-29 DIAGNOSIS — K219 Gastro-esophageal reflux disease without esophagitis: Secondary | ICD-10-CM

## 2019-06-29 DIAGNOSIS — Z5111 Encounter for antineoplastic chemotherapy: Secondary | ICD-10-CM

## 2019-06-29 DIAGNOSIS — B370 Candidal stomatitis: Secondary | ICD-10-CM

## 2019-06-29 MED ORDER — CLOTRIMAZOLE 10 MG MT TROC
10.00 mg | Freq: Three times a day (TID) | OROMUCOSAL | 0 refills | Status: DC
Start: 2019-06-29 — End: 2019-11-28

## 2019-06-29 MED ORDER — LIDOCAINE-PRILOCAINE 2.5-2.5 % EX CREA
TOPICAL_CREAM | CUTANEOUS | 3 refills | Status: DC | PRN
Start: 2019-06-29 — End: 2019-12-08

## 2019-06-29 MED ORDER — DEXLANSOPRAZOLE 60 MG PO CPDR
60.00 mg | DELAYED_RELEASE_CAPSULE | Freq: Every day | ORAL | 1 refills | Status: DC
Start: 2019-06-29 — End: 2019-11-28

## 2019-06-29 NOTE — Progress Notes (Signed)
[facility]  [address] [name]  [phone]      [name] Office  [phone]  Visit Date: 06/29/2019    Chief complaint: Neoadjuvant breast cancer chemotherapy treatment follow up       HISTORY OF PRESENT ILLNESS:  Leslie [name] is s/p C1 Day 1 neoadjuvant Carbo/ Taxol  On 5/6/ due for Day 8 tomorrow. This was preceded by ddAC. She presents for treatment follow up.     Continues with oral thrush symptoms- mouth feels cottony and has bad taste changes. Can't stand MMW- makes he nauseous.     Feeling very tired.   Having pain from knees down to legs.     Still having episodes of nausea intermittently mostly in the mornings .   Sometimes will vomit and then feels like a tightness in upper chest after vomiting.   Finds Ativan most helpful over Compazine or Zofran. Going to start wearing nausea bands.   Having heartburn and finds dexilant most helpful; asking for refill.     Feels like she is constantly dehydrated.   Requests fluids with her infusion tomorrow.       Oncology History Overview Note   Physician Requesting Consult: de Lawson Radar, Dairl Ponder, MD    Breast Surgeon: Orlene Och, MD  Radiation Oncologist:  Plastic Surgeon:   PCP: Satira Mccallum, MD      Leslie [name] is a 49 y.o.-year-old female with newly diagnosed IDC of the left breast.   She presented with abnormal mammogram.     Bilateral screening mammogram 02/2019 showed left breast 3.1 x 2 m hypoechoic mass and midly enlarged axillary node abnormality.     CNB left breast mass 2 OC and left axillary node 03/07/2019 [facility]) showed:   A) Breast: IDC, G3, ER 0%, PR 0%, Her 2 neu negative (1+) by IHC, Ki 67 80%  B) B) Lymph node, left axillary: benign-appearing lymph node.    Bilateral breast MRI and Genetic consult ordered. Path review pending.       Breast Cancer Risk Factors:  Age at Menarche:  29  Age at 79st FTP:  78    Still menstruating?:  No   Last menses: 2008   Hysterectomy: Yes 2008 TAH+ left oophorectomy    Estrogen Therapy:          Oral Contraceptives: No           Hormone Replacement Therapy:  No         Reproductive Assistance Therapies:  No  Family History of Cancer:            Breast:   Yes maternal aunt age 16, maternal cousin age 80, paternal cousin age 74,       Ashkenazi Jewish Ancestry: No     Genetic testing: pending       Denies F/C/N/V/diarrhea. No CP/SOB. No HA/visual changes/focal weakness.        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, estrogen receptor negative     04/14/2019 - 06/22/2019 Chemotherapy    cyclophosphamide (CYTOXAN) 1,500 mg in sodium chloride 0.9 % 360 mL chemo infusion, 1,554 mg, Intravenous, Once, 4 of 4 cycles  Administration: 1,500 mg (04/14/2019), 1,500 mg (04/28/2019), 1,500 mg (05/12/2019), 1,500 mg (06/09/2019)  pegfilgrastim (NEULASTA) injection 6 mg, 6 mg, Subcutaneous, Once, 2 of 2  cycles  Administration: 6 mg (04/14/2019), 6 mg (04/29/2019)  pegfilgrastim (NEULASTA) delivery kit 6 mg, 6 mg, Subcutaneous, Once, 3 of 3 cycles  Administration: 6 mg (04/28/2019), 6 mg (05/12/2019), 6 mg (06/09/2019)  DOXOrubicin (ADRIAMYCIN) 155 mg in sodium chloride 0.9 % 362.5 mL chemo infusion, 60 mg/m2 = 155 mg, Intravenous, Once, 4 of 4 cycles  Administration: 155 mg (04/14/2019), 155 mg (04/28/2019), 155 mg (05/12/2019), 155 mg (06/09/2019)     06/23/2019 -  Chemotherapy    CARBOplatin (PARAPLATIN) 225 mg in sodium chloride 0.9 % 307.5 mL chemo infusion, 225 mg, Intravenous, Once, 1 of 4 cycles  Administration: 225 mg (06/23/2019)  PACLitaxel (TAXOL) 210 mg in sodium chloride 0.9 % 320 mL chemo infusion, 80 mg/m2 = 210 mg, Intravenous, Once, 1 of 4 cycles  Administration: 210 mg (06/23/2019)       Cancer Staging  No matching staging information was found for the patient.    Past Medical History:   Diagnosis Date    Fibroid     Herpes simplex without mention of  complication 2003    possible recurrance in 2013    Hypertension     Lymphedema of extremity 11/03/2017    Neurologic disorder     brachial neuritis    Obesity     Urinary tract infection     Vitamin D deficiency      Past Surgical History:   Procedure Laterality Date    HYSTERECTOMY  2008    fibroids, readmitted for pelvic abscess formation POD#3    MEDIPORT CHECK Right 06/08/2019    Procedure: MEDIPORT CHECK;  Surgeon: Rex Kras, MD;  Location: [facility];  Service: Interventional Radiology;  Laterality: Right;    MEDIPORT PLACEMENT N/A 04/08/2019    Procedure: MEDIPORT PLACEMENT;  Surgeon: Pollyann Kennedy, MD;  Location: [facility];  Service: Interventional Radiology;  Laterality: N/A;    OTHER SURGICAL HISTORY  05/04/2015    Obalon balloon - have 3 balloons placed in stomach for weight loss, Dr. Georgann Housekeeper    SALPINGOOPHORECTOMY Right 2008    RSO with hysterectomy    TUBAL LIGATION  02/17/1997     Social History     Socioeconomic History    Marital status: Legally Separated     Spouse name: None    Number of children: None    Years of education: None    Highest education level: None   Occupational History    None   Tobacco Use    Smoking status: Never Smoker    Smokeless tobacco: Never Used   Substance and Sexual Activity    Alcohol use: Yes     Comment: wine - social, 2-4 times a month    Drug use: No    Sexual activity: Yes     Partners: Male   Other Topics Concern    None   Social History Narrative    Has elliptical machine        Lives with boyfriend, daughter home        Son died on Feb 06, 2019 in CCU            Mom staying with her, good support system 05/30/2019     Social Determinants of Health     Financial Resource Strain:     Difficulty of Paying Living Expenses:    Food Insecurity:     Worried About Radiation protection practitioner of Food in the Last Year:     Ran Out of Food in the Last  Year:    Transportation Needs:     Freight forwarder (Medical):     Lack of Transportation (Non-Medical):     Physical Activity: Insufficiently Active    Days of Exercise per Week: 7 days    Minutes of Exercise per Session: 20 min   Stress:     Feeling of Stress :    Social Connections:     Frequency of Communication with Friends and Family:     Frequency of Social Gatherings with Friends and Family:     Attends Religious Services:     Active Member of Clubs or Organizations:     Attends Engineer, structural:     Marital Status:    Intimate Partner Violence:     Fear of Current or Ex-Partner:     Emotionally Abused:     Physically Abused:     Sexually Abused:       Family History   Problem Relation Age of Onset    Hypertension Mother     Coronary artery disease Mother     Heart disease Mother     Hyperlipidemia Mother     Hypertension Father     Diabetes Father     Other Father         cardiac catheterization    Hypertension Sister     Breast cancer Maternal Aunt     Uterine cancer Maternal Aunt     Vaginal cancer Maternal Aunt     Breast cancer Other         maternal cousin    Ovarian cancer Neg Hx        Allergies   Allergen Reactions    Valsartan Other (See Comments)     achiness    Penicillins Hives     Symptoms occurred as child  Onset date: 07-28-2004     has a current medication list which includes the following prescription(s): apixaban, clotrimazole, dexamethasone, dexlansoprazole, ergocalciferol, lidocaine-prilocaine, lorazepam, losartan-hydrochlorothiazide, olanzapine, ondansetron, prochlorperazine, and tizanidine.        REVIEW OF SYSTEMS:   Per HPI and Denies fever, chills, dyspnea, cough, chest discomfort, nausea, vomiting, diarrhea, oral health changes, skin rash, oral lesions,  leg edema, irritation at infusion site, or paresthesias to hands or feet    PHYSICAL EXAM:  BP 129/83 (BP Site: Right arm, Patient Position: Sitting, Cuff Size: Large)    Pulse (!) 112    Temp 97 F (36.1 C) (Temporal)    Resp 20    Ht 1.753 m (5\' 9" )    Wt 134.3 kg (296 lb 2 oz)    SpO2 98%     BMI 43.73 kg/m     Performance Status:  0 - Fully active, able to carry on all pre-disease performance without restriction         General Appearance: Alert, cooperative, no distress, appears stated age  Head:  Normocephalic, without obvious abnormality, atraumatic  Light coating of oral thrush   Neck: Supple, symmetrical, trachea midline, no adenopathy;   thyroid:  no enlargement/tenderness/nodules  Back: Symmetric, no curvature, ROM normal, no CVA tenderness  Lungs: Clear to auscultation bilaterally, respirations unlabored  Chest Wall: No tenderness or deformity  Heart: Regular rate and rhythm, S1 and S2 normal, no murmur, rub or gallop  Breast Exam:left breast : Ptosis Grade 3No skin/ nipple changes. No palpable mass, dimpling, or left axillary adenopathy   Abdomen: Soft, non-tender, bowel sounds active all four quadrants, no masses, no organomegaly  Extremities: Extremities  normal, atraumatic, no cyanosis or edema  Skin:  Skin color, texture, turgor normal, no rashes or lesions    Labs Results:  Reviewed in epic today  Lab Results   Component Value Date    WBC 5.59 06/23/2019    HGB 10.6 (L) 06/23/2019    HCT 32.5 (L) 06/23/2019    MCV 91.8 06/23/2019    PLT 160 06/23/2019     Lab Results   Component Value Date    CREAT 0.9 06/23/2019    BUN 12.0 06/23/2019    NA 140 06/23/2019    K 3.8 06/23/2019    CL 104 06/23/2019    CO2 29 06/23/2019     Lab Results   Component Value Date    ALT <20 06/23/2019    AST <25 06/23/2019    ALKPHOS 82 04/14/2019    BILITOTAL 0.4 04/14/2019       Imaging Results: reviewed ECHO 04/12/2019     No problem-specific Assessment & Plan   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    C1D1 AC on 04/14/2019. Experienced severe constipation and heartburn.   C2D1 AC on 04/28/2019.   C3D1 AC on  05/12/2019   C4 D1 AC on 4/ 22/ 21   C1 D1 Carbo/ Taxol on 5/ 6      Followed by cardio-oncology.   BaselineECHO2/23/2021 showed EF 66% and GLS -19%.  ECHOpost C#2 [facility] 05/04/2019 showed EF 64% and GLS -19%.  Echo post C4 AC- 4/19/ 21 showed EF 64% GLS -19. %       Several issues with chemotherapy:   Oral thrush- started mycelex troches tid for 1 week   Nausea: Ativan helping the best with Zyprexa. Does not want to add more meds.   GERD likely contributing. Will start to use nausea bands.   Dyspepsia: continue with Dexilant 60 mg. Refill provided. Prn  pecpid and carafate as  Continue zyprexa to 5 mg  Bone pain in  legs: Likely from Taxol. Continue with Tramadol as needed.   Progressed fatigue: Dicussed expectations with fatigued with continued treatment.   Recent echo WNL. Stable anemia.   Encouraged daily exercise as tolerated.   Will add hydration orders to treatment tomorrow.     She is scheduled for left breast sonogram to assess response in left breast since not palpable.    - scheduled on 5/ 17/ 21           All questions answered to the patient's satisfaction.       Will remain as an available resource for this patient and assist with coordination of care/education as needed.     She will return to see Dr. Ladell Heads in 3 weeks.          Teena Irani, PA-C  [facility]  [address]   [place], [place] [address]   Office [phone]   Fax: ( [phone]  LOS Documentation:    Number and Complexity of Problems Addressed:  1 or more chronic illnesses with severe exacerbation, progression, or side effects of treatment    Amount and/or Complexity of Data Reviewed:  Reviewed last  oncology progress notes, infusion nursing/ NP notes, CBC/ CMP trends    Risk of Complications and/or Morbidity or Mortality of Patient Management:  Drug therapy requiring intensive monitoring for toxicity

## 2019-06-30 ENCOUNTER — Ambulatory Visit: Payer: Commercial Managed Care - POS

## 2019-06-30 ENCOUNTER — Other Ambulatory Visit (HOSPITAL_BASED_OUTPATIENT_CLINIC_OR_DEPARTMENT_OTHER): Payer: Self-pay | Admitting: Physician Assistant

## 2019-06-30 ENCOUNTER — Ambulatory Visit: Payer: Commercial Managed Care - POS | Attending: Internal Medicine

## 2019-06-30 ENCOUNTER — Encounter: Payer: Self-pay | Admitting: Internal Medicine

## 2019-06-30 VITALS — BP 131/84 | HR 117 | Temp 98.5°F | Resp 18 | Ht 69.02 in | Wt 298.4 lb

## 2019-06-30 DIAGNOSIS — I82621 Acute embolism and thrombosis of deep veins of right upper extremity: Secondary | ICD-10-CM

## 2019-06-30 DIAGNOSIS — Z171 Estrogen receptor negative status [ER-]: Secondary | ICD-10-CM

## 2019-06-30 DIAGNOSIS — E860 Dehydration: Secondary | ICD-10-CM | POA: Insufficient documentation

## 2019-06-30 DIAGNOSIS — Z5111 Encounter for antineoplastic chemotherapy: Secondary | ICD-10-CM | POA: Insufficient documentation

## 2019-06-30 DIAGNOSIS — K0889 Other specified disorders of teeth and supporting structures: Secondary | ICD-10-CM | POA: Insufficient documentation

## 2019-06-30 DIAGNOSIS — C50412 Malignant neoplasm of upper-outer quadrant of left female breast: Secondary | ICD-10-CM | POA: Insufficient documentation

## 2019-06-30 LAB — CBC AND DIFFERENTIAL
Absolute NRBC: 0 10*3/uL (ref 0.00–0.00)
Basophils Absolute Automated: 0.03 10*3/uL (ref 0.00–0.08)
Basophils Automated: 0.7 %
Eosinophils Absolute Automated: 0.02 10*3/uL (ref 0.00–0.44)
Eosinophils Automated: 0.5 %
Hematocrit: 29.3 % — ABNORMAL LOW (ref 34.7–43.7)
Hgb: 9.6 g/dL — ABNORMAL LOW (ref 11.4–14.8)
Immature Granulocytes Absolute: 0.03 10*3/uL (ref 0.00–0.07)
Immature Granulocytes: 0.7 %
Lymphocytes Absolute Automated: 0.99 10*3/uL (ref 0.42–3.22)
Lymphocytes Automated: 22.4 %
MCH: 30 pg (ref 25.1–33.5)
MCHC: 32.8 g/dL (ref 31.5–35.8)
MCV: 91.6 fL (ref 78.0–96.0)
MPV: 10 fL (ref 8.9–12.5)
Monocytes Absolute Automated: 0.39 10*3/uL (ref 0.21–0.85)
Monocytes: 8.8 %
Neutrophils Absolute: 2.95 10*3/uL (ref 1.10–6.33)
Neutrophils: 66.9 %
Nucleated RBC: 0 /100 WBC (ref 0.0–0.0)
Platelets: 208 10*3/uL (ref 142–346)
RBC: 3.2 10*6/uL — ABNORMAL LOW (ref 3.90–5.10)
RDW: 17 % — ABNORMAL HIGH (ref 11–15)
WBC: 4.41 10*3/uL (ref 3.10–9.50)

## 2019-06-30 LAB — COMPREHENSIVE METABOLIC PANEL
ALT: 16 U/L (ref 0–55)
AST (SGOT): 14 U/L (ref 5–34)
Albumin/Globulin Ratio: 1.2 (ref 0.9–2.2)
Albumin: 3.6 g/dL (ref 3.5–5.0)
Alkaline Phosphatase: 72 U/L (ref 37–106)
Anion Gap: 12 (ref 5.0–15.0)
BUN: 12 mg/dL (ref 7.0–19.0)
Bilirubin, Total: 0.3 mg/dL (ref 0.2–1.2)
CO2: 25 mEq/L (ref 21–29)
Calcium: 9 mg/dL (ref 8.5–10.5)
Chloride: 102 mEq/L (ref 100–111)
Creatinine: 1 mg/dL (ref 0.4–1.5)
Globulin: 3 g/dL (ref 2.0–3.7)
Glucose: 154 mg/dL — ABNORMAL HIGH (ref 70–100)
Potassium: 3.4 mEq/L — ABNORMAL LOW (ref 3.5–5.1)
Protein, Total: 6.6 g/dL (ref 6.0–8.3)
Sodium: 139 mEq/L (ref 136–145)

## 2019-06-30 LAB — GFR: EGFR: >60

## 2019-06-30 LAB — HEMOLYSIS INDEX: Hemolysis Index: 0 (ref 0–18)

## 2019-06-30 MED ORDER — PACLITAXEL 6 MG/ML IV CONC (WRAP)
80.00 mg/m2 | Freq: Once | INTRAVENOUS | Status: AC
Start: 2019-06-30 — End: 2019-06-30
  Administered 2019-06-30: 13:00:00 210 mg via INTRAVENOUS
  Filled 2019-06-30: qty 35

## 2019-06-30 MED ORDER — APIXABAN 5 MG PO TABS
5.0000 mg | ORAL_TABLET | Freq: Two times a day (BID) | ORAL | 3 refills | Status: DC
Start: 2019-06-30 — End: 2019-11-28

## 2019-06-30 MED ORDER — SODIUM CHLORIDE 0.9 % IV SOLN
225.00 mg | Freq: Once | INTRAVENOUS | Status: AC
Start: 2019-06-30 — End: 2019-06-30
  Administered 2019-06-30: 14:00:00 225 mg via INTRAVENOUS
  Filled 2019-06-30: qty 22.5

## 2019-06-30 MED ORDER — HEPARIN SOD (PORK) LOCK FLUSH 100 UNIT/ML IV SOLN
5.00 mL | INTRAVENOUS | Status: DC | PRN
Start: 2019-06-30 — End: 2019-07-01
  Administered 2019-06-30: 16:00:00 5 mL
  Filled 2019-06-30: qty 5

## 2019-06-30 MED ORDER — SODIUM CHLORIDE 0.9 % IV BOLUS
500.00 mL | Freq: Once | INTRAVENOUS | Status: AC
Start: 2019-06-30 — End: 2019-06-30
  Administered 2019-06-30: 10:00:00 500 mL via INTRAVENOUS

## 2019-06-30 MED ORDER — ONDANSETRON IVPB 16 MG IN NS 50 ML
16.00 mg | Freq: Once | INTRAVENOUS | Status: AC
Start: 2019-06-30 — End: 2019-06-30
  Administered 2019-06-30: 13:00:00 16 mg via INTRAVENOUS
  Filled 2019-06-30: qty 50

## 2019-06-30 MED ORDER — DIPHENHYDRAMINE HCL 25 MG PO CAPS
25.00 mg | ORAL_CAPSULE | Freq: Once | ORAL | Status: AC
Start: 2019-06-30 — End: 2019-06-30
  Administered 2019-06-30: 12:00:00 25 mg via ORAL
  Filled 2019-06-30: qty 1

## 2019-06-30 MED ORDER — APREPITANT 130 MG/18ML IV EMUL
130.00 mg | Freq: Once | INTRAVENOUS | Status: AC
Start: 2019-06-30 — End: 2019-06-30
  Administered 2019-06-30: 12:00:00 130 mg via INTRAVENOUS
  Filled 2019-06-30: qty 18

## 2019-06-30 MED ORDER — SODIUM CHLORIDE (PF) 0.9 % IJ SOLN
5.00 mL | INTRAMUSCULAR | Status: DC | PRN
Start: 2019-06-30 — End: 2019-07-01

## 2019-06-30 MED ORDER — FAMOTIDINE 10 MG/ML IV SOLN (WRAP)
20.00 mg | Freq: Once | INTRAVENOUS | Status: AC
Start: 2019-06-30 — End: 2019-06-30
  Administered 2019-06-30: 12:00:00 20 mg via INTRAVENOUS
  Filled 2019-06-30: qty 2

## 2019-06-30 MED ORDER — DEXAMETHASONE IVPB 12 MG IN NS 50 ML
12.00 mg | Freq: Once | INTRAVENOUS | Status: AC | PRN
Start: 2019-06-30 — End: 2019-06-30
  Administered 2019-06-30: 12:00:00 12 mg via INTRAVENOUS
  Filled 2019-06-30: qty 50

## 2019-06-30 MED ORDER — SODIUM CHLORIDE 0.9 % IV BOLUS
500.00 mL | Freq: Once | INTRAVENOUS | Status: AC
Start: 2019-06-30 — End: 2019-06-30
  Administered 2019-06-30: 15:00:00 500 mL via INTRAVENOUS

## 2019-06-30 NOTE — Progress Notes (Signed)
[facility]   Visit Date: 06/30/2019        Leslie [name] is a 49 y.o. female patient of [name], MD        C1D8 Taxol/Carboplatin    Since her last visit, she has been doing well.  She met with [name] yesterday to discuss symptom management. She has taste alteration, fatigue joint pain and nausea. We will add 500cc NS before and after treatment today.  Pt also reporting new tooth pain, [name] NP to evaluate, ok to treat and pt will f/u with dentist.     Diagnosis: Breast Cancer  Treatment on Clinical Trial: Not On Study (NOS)  [facility](O) - OP Adult - Breast Neoadjuvant - PACLitaxel + CARBOplatin (D1,8,15) Q 21 Days X 4 Cycles  [facility] - OP Adult - Normal Saline Hydration (1000 ml/hr)      IV Pre Hydration: Completed    Pre-Medications: aprepitant IV (Cinvanti), dexamethasone (Decadron), diphenhydramine (Benadryl), famotidine (Pepcid) and ondansetron (Zofran)    Line Type: RUC port  Blood Return verified prior to administration: Yes    Vital Signs:    Patient Vitals for the past 12 hrs:   BP Temp Pulse Resp   06/30/19 1058 131/84 98.5 F (36.9 C) (!) 117 18      Body surface area is 2.57 meters squared.    '  Chemistry        Component Value Date/Time    NA 140 06/23/2019 1133    K 3.8 06/23/2019 1133    CL 104 06/23/2019 1133    CO2 29 06/23/2019 1133    BUN 12.0 06/23/2019 1133    CREAT 0.9 06/23/2019 1133    GLU 141 (H) 06/23/2019 1133        Component Value Date/Time    CA 9.6 06/23/2019 1133    ALKPHOS 82 04/14/2019 1600    AST <25 06/23/2019 1133    ALT <20 06/23/2019 1133    BILITOTAL 0.4 04/14/2019 1600            Lab Results   Component Value Date    WBC 4.41 06/30/2019    HGB 9.6 (L) 06/30/2019    PLT 208 06/30/2019    NEUTROABS 2.95 06/30/2019       Administrations This Visit     aprepitant (CINVANTI) injection 130 mg     Admin Date  06/30/2019  12:03 Action  Given Dose  130 mg Route  Intravenous Ordering Provider  [name], MD          CARBOplatin (PARAPLATIN) 225 mg in  sodium chloride 0.9 % 307.5 mL chemo infusion     Admin Date  06/30/2019  14:09 Action  New Bag Dose  225 mg Rate  308 mL/hr Route  Intravenous Ordering Provider  [name], MD          dexAMETHasone (DECADRON) 12 mg in sodium chloride 0.9 % 50 mL IVPB (premix)     Admin Date  06/30/2019  12:07 Action  New Bag Dose  12 mg Rate  200 mL/hr Route  Intravenous Ordering Provider  [name], MD          diphenhydrAMINE (BENADRYL) capsule 25 mg     Admin Date  06/30/2019  12:04 Action  Given Dose  25 mg Route  Oral Ordering Provider  [name], MD          famotidine (PEPCID) injection 20 mg     Admin Date  06/30/2019  12:02  Action  Given Dose  20 mg Route  Intravenous Ordering Provider  [name], MD          ondansetron [facility]) 16 mg in sodium chloride 0.9 % 50 mL IVPB (premix)     Admin Date  06/30/2019  12:30 Action  New Bag Dose  16 mg Rate  200 mL/hr Route  Intravenous Ordering Provider  [name], MD          PACLitaxel (TAXOL) 210 mg in sodium chloride 0.9 % 320 mL chemo infusion     Admin Date  06/30/2019  13:01 Action  New Bag Dose  210 mg Rate  320 mL/hr Route  Intravenous Ordering Provider  [name], MD                Chemo Given within the past 12 hours:   "Chemo/Biotherapy Given" (last 12 hours)     Date/Time Action User Medication Dose Rate Dose Volume (mL) Rate    06/30/19 1409 New Bag    AT CARBOplatin (PARAPLATIN) 225 mg in sodium chloride 0.9 % 307.5 mL chemo infusion 225 mg 308 mL/hr       06/30/19 1401 Stopped    AT PACLitaxel (TAXOL) 210 mg in sodium chloride 0.9 % 320 mL chemo infusion  0 mL/hr       06/30/19 1301 New Bag    AT PACLitaxel (TAXOL) 210 mg in sodium chloride 0.9 % 320 mL chemo infusion 210 mg 320 mL/hr               Hypersensitivity Reaction Noted: No  IV Post Hydration: YES  Treatment Outcome:Patient tolerated treatment well. Central Line flushed and deacessed.    Education: Patient/Family educated regarding the expected outcomes/side effects  possible interactions of treatments. Patient verbalizes understanding.    Follow-up Plan: Discharged in stable condition. Accompanied by, sister. Instructed to contact physician if any complications or unmanageable symptoms occur.        [name], RN    06/30/2019  3:00 PM

## 2019-06-30 NOTE — Progress Notes (Signed)
PROGRESS NOTE        Brief Note     Patient Name: Leslie [name], Leslie [name]      CC: Toothache    Leslie [name] with breast cancer presents to the infusion clinic for Cycle 1, Day 8 Taxol/Carbo    [facility](O) - OP Adult - Breast Neoadjuvant - PACLitaxel + CARBOplatin (D1,8,15) Q 21 Days X 4 Cycles     Oncologist: A. Ladell Heads, MD    Ms. Leslie [name] is  well-appearing and not in distress.  She reports pain in her left upper 2 nd molar and lower left 3rd molar. She noted toothache yesterday night. No abnormal findings noted on visual examination. Patient tells me that her upper left jaw is sensitive.achy when she pushes on the upper molar. She has not seen a dentist yet. Otherwise, she feels fair. She discussed all her concerns with Teena Irani, PA on 5/12.  Patient denies cough, fever, chills, palpitations, GI/GU symptoms, change in lower extremity swelling (patient with history of LE lymphedema).   She offers no other concerns or complaints.    Review of Systems     Review of Systems   Constitutional: Positive for malaise/fatigue. Negative for chills and fever.   HENT:        Toothache   Respiratory: Negative for cough and shortness of breath.    Cardiovascular: Negative for chest pain, palpitations and leg swelling.   Gastrointestinal: Negative.    Genitourinary: Negative.    Musculoskeletal: Negative.    Skin: Negative.    Neurological: Negative for dizziness, speech change, weakness and headaches.   Psychiatric/Behavioral: Negative.          Assessment / Plan:      1. Toothache  - schedule an appointment with your dentist ASAP    2. Breast cancer  - Continue with the treatment as planned   -f/u with Dr. Ladell Heads on 6/1      As always, patient was instructed to seek medical care with temperature 100.4 or higher and/or any other concerning S&S. Patient verbalized understanding.    Jovita Gamma, NP

## 2019-07-01 ENCOUNTER — Encounter: Payer: Self-pay | Admitting: Physician Assistant

## 2019-07-01 ENCOUNTER — Encounter: Payer: Self-pay | Admitting: Internal Medicine

## 2019-07-01 ENCOUNTER — Telehealth: Payer: Self-pay | Admitting: Family Nurse Practitioner

## 2019-07-01 NOTE — Telephone Encounter (Signed)
07/01/19    Patient called after hours. She said she is out of eliquis, prescription was sent yesterday to pharmacy but her insurance requires pre-authorization. This was faxed to the office. 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     Okayed to be off of it for two days then follow-up with office on Monday so she can get started on blood thinner.     Team- please follow-up with patient first thing Monday morning.     Craig Guess, MSN, NP-C  Oncology Nurse Practitioner  [facility]    [address]  [address]  [place], [place] [address]  P: [phone]  F: [phone]

## 2019-07-04 ENCOUNTER — Ambulatory Visit
Admission: RE | Admit: 2019-07-04 | Discharge: 2019-07-04 | Disposition: A | Discharge status: Home / Self Care | Payer: Commercial Managed Care - POS | Source: Ambulatory Visit | Attending: Internal Medicine | Admitting: Internal Medicine

## 2019-07-04 DIAGNOSIS — Z171 Estrogen receptor negative status [ER-]: Secondary | ICD-10-CM

## 2019-07-04 DIAGNOSIS — C50412 Malignant neoplasm of upper-outer quadrant of left female breast: Secondary | ICD-10-CM

## 2019-07-04 NOTE — Telephone Encounter (Signed)
Chitra, can you assist in getting her Eliquis approved?    Thank you!!!

## 2019-07-04 NOTE — Telephone Encounter (Signed)
Left detailed message for patient that medication was approved and she can call her pharmacy to fill it.

## 2019-07-04 NOTE — Telephone Encounter (Signed)
Received approval until 07/03/20

## 2019-07-04 NOTE — Telephone Encounter (Signed)
Prior authorization submitted and marked urgent. Awaiting determination.

## 2019-07-05 ENCOUNTER — Encounter: Payer: Self-pay | Admitting: Internal Medicine

## 2019-07-06 ENCOUNTER — Encounter: Payer: Self-pay | Admitting: Internal Medicine

## 2019-07-07 ENCOUNTER — Ambulatory Visit: Payer: Commercial Managed Care - POS | Attending: Internal Medicine

## 2019-07-07 VITALS — BP 112/72 | HR 115 | Temp 98.4°F | Resp 18 | Ht 69.02 in | Wt 295.9 lb

## 2019-07-07 DIAGNOSIS — Z171 Estrogen receptor negative status [ER-]: Secondary | ICD-10-CM | POA: Insufficient documentation

## 2019-07-07 DIAGNOSIS — Z5111 Encounter for antineoplastic chemotherapy: Secondary | ICD-10-CM | POA: Insufficient documentation

## 2019-07-07 DIAGNOSIS — C50412 Malignant neoplasm of upper-outer quadrant of left female breast: Secondary | ICD-10-CM | POA: Insufficient documentation

## 2019-07-07 LAB — COMPREHENSIVE METABOLIC PANEL
ALT: 24 U/L (ref 0–55)
AST (SGOT): 19 U/L (ref 5–34)
Albumin/Globulin Ratio: 1.2 (ref 0.9–2.2)
Albumin: 3.8 g/dL (ref 3.5–5.0)
Alkaline Phosphatase: 79 U/L (ref 37–106)
Anion Gap: 11 (ref 5.0–15.0)
BUN: 12 mg/dL (ref 7.0–19.0)
Bilirubin, Total: 0.3 mg/dL (ref 0.2–1.2)
CO2: 26 mEq/L (ref 21–29)
Calcium: 9.1 mg/dL (ref 8.5–10.5)
Chloride: 101 mEq/L (ref 100–111)
Creatinine: 1.1 mg/dL (ref 0.4–1.5)
Globulin: 3.1 g/dL (ref 2.0–3.7)
Glucose: 143 mg/dL — ABNORMAL HIGH (ref 70–100)
Potassium: 3.6 mEq/L (ref 3.5–5.1)
Protein, Total: 6.9 g/dL (ref 6.0–8.3)
Sodium: 138 mEq/L (ref 136–145)

## 2019-07-07 LAB — HEMOLYSIS INDEX: Hemolysis Index: 2 (ref 0–18)

## 2019-07-07 LAB — CBC AND DIFFERENTIAL
Absolute NRBC: 0 10*3/uL (ref 0.00–0.00)
Basophils Absolute Automated: 0.02 10*3/uL (ref 0.00–0.08)
Basophils Automated: 0.7 %
Eosinophils Absolute Automated: 0.01 10*3/uL (ref 0.00–0.44)
Eosinophils Automated: 0.3 %
Hematocrit: 29.8 % — ABNORMAL LOW (ref 34.7–43.7)
Hgb: 9.8 g/dL — ABNORMAL LOW (ref 11.4–14.8)
Immature Granulocytes Absolute: 0.01 10*3/uL (ref 0.00–0.07)
Immature Granulocytes: 0.3 %
Lymphocytes Absolute Automated: 0.75 10*3/uL (ref 0.42–3.22)
Lymphocytes Automated: 26 %
MCH: 30.6 pg (ref 25.1–33.5)
MCHC: 32.9 g/dL (ref 31.5–35.8)
MCV: 93.1 fL (ref 78.0–96.0)
MPV: 10.3 fL (ref 8.9–12.5)
Monocytes Absolute Automated: 0.26 10*3/uL (ref 0.21–0.85)
Monocytes: 9 %
Neutrophils Absolute: 1.84 10*3/uL (ref 1.10–6.33)
Neutrophils: 63.7 %
Nucleated RBC: 0 /100 WBC (ref 0.0–0.0)
Platelets: 190 10*3/uL (ref 142–346)
RBC: 3.2 10*6/uL — ABNORMAL LOW (ref 3.90–5.10)
RDW: 17 % — ABNORMAL HIGH (ref 11–15)
WBC: 2.89 10*3/uL — ABNORMAL LOW (ref 3.10–9.50)

## 2019-07-07 LAB — GFR: EGFR: >60

## 2019-07-07 MED ORDER — SODIUM CHLORIDE 0.9 % IV EXCEL
80.00 mg/m2 | Freq: Once | INTRAVENOUS | Status: AC
Start: 2019-07-07 — End: 2019-07-07
  Administered 2019-07-07: 12:00:00 210 mg via INTRAVENOUS
  Filled 2019-07-07: qty 35

## 2019-07-07 MED ORDER — DIPHENHYDRAMINE HCL 25 MG PO CAPS
25.00 mg | ORAL_CAPSULE | Freq: Once | ORAL | Status: AC | PRN
Start: 2019-07-07 — End: 2019-07-07
  Administered 2019-07-07: 11:00:00 25 mg via ORAL
  Filled 2019-07-07: qty 1

## 2019-07-07 MED ORDER — FAMOTIDINE 10 MG/ML IV SOLN (WRAP)
20.00 mg | Freq: Once | INTRAVENOUS | Status: AC
Start: 2019-07-07 — End: 2019-07-07
  Administered 2019-07-07: 11:00:00 20 mg via INTRAVENOUS
  Filled 2019-07-07: qty 2

## 2019-07-07 MED ORDER — APREPITANT 130 MG/18ML IV EMUL
130.00 mg | Freq: Once | INTRAVENOUS | Status: AC
Start: 2019-07-07 — End: 2019-07-07
  Administered 2019-07-07: 11:00:00 130 mg via INTRAVENOUS
  Filled 2019-07-07: qty 18

## 2019-07-07 MED ORDER — ONDANSETRON IVPB 16 MG IN NS 50 ML
16.00 mg | Freq: Once | INTRAVENOUS | Status: AC
Start: 2019-07-07 — End: 2019-07-07
  Administered 2019-07-07: 11:00:00 16 mg via INTRAVENOUS
  Filled 2019-07-07: qty 50

## 2019-07-07 MED ORDER — SODIUM CHLORIDE 0.9 % IV BOLUS
500.00 mL | Freq: Once | INTRAVENOUS | Status: AC
Start: 2019-07-07 — End: 2019-07-07
  Administered 2019-07-07: 11:00:00 500 mL via INTRAVENOUS

## 2019-07-07 MED ORDER — HEPARIN SOD (PORK) LOCK FLUSH 100 UNIT/ML IV SOLN
5.00 mL | INTRAVENOUS | Status: DC | PRN
Start: 2019-07-07 — End: 2019-07-07
  Administered 2019-07-07: 15:00:00 5 mL
  Filled 2019-07-07: qty 5

## 2019-07-07 MED ORDER — SODIUM CHLORIDE 0.9 % IV BOLUS
500.00 mL | Freq: Once | INTRAVENOUS | Status: AC
Start: 2019-07-07 — End: 2019-07-07
  Administered 2019-07-07: 14:00:00 500 mL via INTRAVENOUS

## 2019-07-07 MED ORDER — SODIUM CHLORIDE 0.9 % IV SOLN
225.00 mg | Freq: Once | INTRAVENOUS | Status: AC
Start: 2019-07-07 — End: 2019-07-07
  Administered 2019-07-07: 13:00:00 225 mg via INTRAVENOUS
  Filled 2019-07-07: qty 22.5

## 2019-07-07 MED ORDER — SODIUM CHLORIDE (PF) 0.9 % IJ SOLN
5.00 mL | INTRAMUSCULAR | Status: DC | PRN
Start: 2019-07-07 — End: 2019-07-07
  Administered 2019-07-07: 15:00:00 10 mL via INTRAVENOUS

## 2019-07-07 NOTE — Progress Notes (Signed)
[facility]   Visit Date: 07/07/2019    Leslie [name] is a 49 y.o. female patient of [name], MD    Since her last visit, she has been doing fair.   Patient presents to the infusion center for Cycle 1, Day 15 Taxol/Carbo.   Overall, she states her energy level is fair, she requires frequent rest periods. Her decreased appetite with an associated weight loss of 0 lbs.. She reports fatigue, intermittent N/V/D/C but has no other concerns/complaints at this time. Pt denies chest pain, SOB, fevers/chills, cough, rash. Labs drawn per protocol, reviewed & copy given to Pt. Criteria to treat met. There are no social work or other referral needs at this time.     ** Pt received pre/post hydration per her request. Per NP [name], Pt received of NS over 30 mins before and after treatment for a total of 1L. **      Diagnosis: Breast Cancer  Treatment on Clinical Trial: Not On Study (NOS)  [facility](O) - OP Adult - Breast Neoadjuvant - PACLitaxel + CARBOplatin (D1,8,15) Q 21 Days X 4 Cycles  [facility] - OP Adult - Normal Saline Hydration (1000 ml/hr)      IV Pre Hydration: Completed  Urine Parameters: N/A  Pre-Medications: aprepitant IV (Cinvanti), diphenhydramine (Benadryl), famotidine (Pepcid) and ondansetron (Zofran). Pt forgot to take Claritin at home.    Line Type: Mediport,    Right Port and Single Port  Blood Return verified prior to administration: Yes    Vital Signs:    Patient Vitals for the past 12 hrs:   BP Temp Pulse Resp   07/07/19 1050 112/72 98.4 F (36.9 C) (!) 115 18      Body surface area is 2.56 meters squared.    '  Chemistry        Component Value Date/Time    NA 139 06/30/2019 1119    NA 140 06/23/2019 1133    K 3.4 (L) 06/30/2019 1119    K 3.8 06/23/2019 1133    CL 102 06/30/2019 1119    CL 104 06/23/2019 1133    CO2 25 06/30/2019 1119    CO2 29 06/23/2019 1133    BUN 12.0 06/30/2019 1119    BUN 12.0 06/23/2019 1133    CREAT 1.0 06/30/2019 1119    CREAT 0.9 06/23/2019 1133    GLU  154 (H) 06/30/2019 1119    GLU 141 (H) 06/23/2019 1133        Component Value Date/Time    CA 9.0 06/30/2019 1119    CA 9.6 06/23/2019 1133    ALKPHOS 72 06/30/2019 1119    AST 14 06/30/2019 1119    AST <25 06/23/2019 1133    ALT 16 06/30/2019 1119    ALT <20 06/23/2019 1133    BILITOTAL 0.3 06/30/2019 1119            Lab Results   Component Value Date    WBC 2.89 (L) 07/07/2019    HGB 9.8 (L) 07/07/2019    PLT 190 07/07/2019    NEUTROABS 1.84 07/07/2019       Administrations This Visit     aprepitant (CINVANTI) injection 130 mg     Admin Date  07/07/2019  11:00 Action  Given Dose  130 mg Route  Intravenous Ordering Provider  [name], MD          diphenhydrAMINE (BENADRYL) capsule 25 mg     Admin Date  07/07/2019  11:00  Action  Given Dose  25 mg Route  Oral Ordering Provider  [name], MD          famotidine (PEPCID) injection 20 mg     Admin Date  07/07/2019  11:01 Action  Given Dose  20 mg Route  Intravenous Ordering Provider  [name], MD          ondansetron [facility]) 16 mg in sodium chloride 0.9 % 50 mL IVPB (premix)     Admin Date  07/07/2019  11:02 Action  New Bag Dose  16 mg Rate  200 mL/hr Route  Intravenous Ordering Provider  [name], MD          PACLitaxel (TAXOL) 210 mg in sodium chloride 0.9 % 320 mL chemo infusion     Admin Date  07/07/2019  11:50 Action  New Bag Dose  210 mg Rate  320 mL/hr Route  Intravenous Ordering Provider  [name], MD          sodium chloride 0.9 % bolus 500 mL     Admin Date  07/07/2019  10:30 Action  New Bag Dose  500 mL Rate  1,000 mL/hr Route  Intravenous Ordering Provider  Jovita Gamma, DNP FNP                Chemo Given within the past 12 hours:   "Chemo/Biotherapy Given" (last 12 hours)     Date/Time Action User Medication Dose Rate Dose Volume (mL) Rate    07/07/19 1442 Given    AT heparin 100 UNIT/ML flush 5 mL 5 mL        07/07/19 1358 Stopped    AT CARBOplatin (PARAPLATIN) 225 mg in sodium chloride 0.9 % 307.5 mL chemo infusion   0 mL/hr       07/07/19 1258 New Bag    AT CARBOplatin (PARAPLATIN) 225 mg in sodium chloride 0.9 % 307.5 mL chemo infusion 225 mg 308 mL/hr       07/07/19 1250 Stopped    AT PACLitaxel (TAXOL) 210 mg in sodium chloride 0.9 % 320 mL chemo infusion  0 mL/hr       07/07/19 1150 New Bag    AT PACLitaxel (TAXOL) 210 mg in sodium chloride 0.9 % 320 mL chemo infusion 210 mg 320 mL/hr               Hypersensitivity Reaction Noted: No  IV Post Hydration: Yes, administered per treatment order.  Treatment Outcome:Patient tolerated treatment well. Port flushed w/ NS & heparin & de-accessed per protocol. Site w/o redness or inflammation; band-aid placed.    Education: Patient/Family educated regarding the expected outcomes/side effects possible interactions of treatments. Patient verbalizes understanding.    Follow-up Plan: Discharged in stable condition. Accompanied by, mother. Instructed to contact physician if any complications or unmanageable symptoms occur.    A return to the Infusion Center for the next treatment, has been scheduled. RTC 07/14/19      Cyndi Lennert, RN    07/07/2019  3:23 PM

## 2019-07-08 ENCOUNTER — Other Ambulatory Visit: Payer: Self-pay | Admitting: Internal Medicine

## 2019-07-08 DIAGNOSIS — C50412 Malignant neoplasm of upper-outer quadrant of left female breast: Secondary | ICD-10-CM

## 2019-07-08 DIAGNOSIS — Z5111 Encounter for antineoplastic chemotherapy: Secondary | ICD-10-CM

## 2019-07-12 ENCOUNTER — Encounter: Payer: Self-pay | Admitting: Internal Medicine

## 2019-07-14 ENCOUNTER — Ambulatory Visit: Payer: Commercial Managed Care - POS | Attending: Internal Medicine

## 2019-07-14 VITALS — BP 123/81 | HR 109 | Temp 98.8°F | Resp 18 | Ht 69.02 in | Wt 297.8 lb

## 2019-07-14 DIAGNOSIS — Z5111 Encounter for antineoplastic chemotherapy: Secondary | ICD-10-CM | POA: Insufficient documentation

## 2019-07-14 DIAGNOSIS — C50412 Malignant neoplasm of upper-outer quadrant of left female breast: Secondary | ICD-10-CM | POA: Insufficient documentation

## 2019-07-14 DIAGNOSIS — Z171 Estrogen receptor negative status [ER-]: Secondary | ICD-10-CM | POA: Insufficient documentation

## 2019-07-14 LAB — CBC AND DIFFERENTIAL
Absolute NRBC: 0 10*3/uL (ref 0.00–0.00)
Basophils Absolute Automated: 0.02 10*3/uL (ref 0.00–0.08)
Basophils Automated: 0.6 %
Eosinophils Absolute Automated: 0.03 10*3/uL (ref 0.00–0.44)
Eosinophils Automated: 1 %
Hematocrit: 27.9 % — ABNORMAL LOW (ref 34.7–43.7)
Hgb: 9.3 g/dL — ABNORMAL LOW (ref 11.4–14.8)
Immature Granulocytes Absolute: 0.01 10*3/uL (ref 0.00–0.07)
Immature Granulocytes: 0.3 %
Lymphocytes Absolute Automated: 0.9 10*3/uL (ref 0.42–3.22)
Lymphocytes Automated: 29.2 %
MCH: 30.8 pg (ref 25.1–33.5)
MCHC: 33.3 g/dL (ref 31.5–35.8)
MCV: 92.4 fL (ref 78.0–96.0)
MPV: 9.7 fL (ref 8.9–12.5)
Monocytes Absolute Automated: 0.26 10*3/uL (ref 0.21–0.85)
Monocytes: 8.4 %
Neutrophils Absolute: 1.86 10*3/uL (ref 1.10–6.33)
Neutrophils: 60.5 %
Nucleated RBC: 0 /100 WBC (ref 0.0–0.0)
Platelets: 131 10*3/uL — ABNORMAL LOW (ref 142–346)
RBC: 3.02 10*6/uL — ABNORMAL LOW (ref 3.90–5.10)
RDW: 17 % — ABNORMAL HIGH (ref 11–15)
WBC: 3.08 10*3/uL — ABNORMAL LOW (ref 3.10–9.50)

## 2019-07-14 LAB — COMPREHENSIVE METABOLIC PANEL
ALT: 41 U/L (ref 0–55)
AST (SGOT): 27 U/L (ref 5–34)
Albumin/Globulin Ratio: 1.2 (ref 0.9–2.2)
Albumin: 3.7 g/dL (ref 3.5–5.0)
Alkaline Phosphatase: 69 U/L (ref 37–106)
Anion Gap: 10 (ref 5.0–15.0)
BUN: 12 mg/dL (ref 7.0–19.0)
Bilirubin, Total: 0.3 mg/dL (ref 0.2–1.2)
CO2: 26 mEq/L (ref 21–29)
Calcium: 8.9 mg/dL (ref 8.5–10.5)
Chloride: 103 mEq/L (ref 100–111)
Creatinine: 1.1 mg/dL (ref 0.4–1.5)
Globulin: 3 g/dL (ref 2.0–3.7)
Glucose: 117 mg/dL — ABNORMAL HIGH (ref 70–100)
Potassium: 3.3 mEq/L — ABNORMAL LOW (ref 3.5–5.1)
Protein, Total: 6.7 g/dL (ref 6.0–8.3)
Sodium: 139 mEq/L (ref 136–145)

## 2019-07-14 LAB — HEMOLYSIS INDEX: Hemolysis Index: 2 (ref 0–18)

## 2019-07-14 LAB — GFR: EGFR: >60

## 2019-07-14 MED ORDER — SODIUM CHLORIDE (PF) 0.9 % IJ SOLN
5.00 mL | INTRAMUSCULAR | Status: DC | PRN
Start: 2019-07-14 — End: 2019-07-14

## 2019-07-14 MED ORDER — SODIUM CHLORIDE 0.9 % IV EXCEL
80.00 mg/m2 | Freq: Once | INTRAVENOUS | Status: AC
Start: 2019-07-14 — End: 2019-07-14
  Administered 2019-07-14: 12:00:00 210 mg via INTRAVENOUS
  Filled 2019-07-14: qty 35

## 2019-07-14 MED ORDER — CARBOPLATIN 10 MG/ML IV SOLN (WRAP)
225.00 mg | Freq: Once | INTRAVENOUS | Status: AC
Start: 2019-07-14 — End: 2019-07-14
  Administered 2019-07-14: 13:00:00 225 mg via INTRAVENOUS
  Filled 2019-07-14: qty 22.5

## 2019-07-14 MED ORDER — APREPITANT 130 MG/18ML IV EMUL
130.00 mg | Freq: Once | INTRAVENOUS | Status: AC
Start: 2019-07-14 — End: 2019-07-14
  Administered 2019-07-14: 12:00:00 130 mg via INTRAVENOUS
  Filled 2019-07-14: qty 18

## 2019-07-14 MED ORDER — DEXAMETHASONE IVPB 12 MG IN NS 50 ML
12.00 mg | Freq: Once | INTRAVENOUS | Status: DC | PRN
Start: 2019-07-14 — End: 2019-07-14

## 2019-07-14 MED ORDER — HEPARIN SOD (PORK) LOCK FLUSH 100 UNIT/ML IV SOLN
5.00 mL | INTRAVENOUS | Status: DC | PRN
Start: 2019-07-14 — End: 2019-07-14
  Administered 2019-07-14: 14:00:00 5 mL
  Filled 2019-07-14: qty 5

## 2019-07-14 MED ORDER — FAMOTIDINE 10 MG/ML IV SOLN (WRAP)
20.00 mg | Freq: Once | INTRAVENOUS | Status: AC
Start: 2019-07-14 — End: 2019-07-14
  Administered 2019-07-14: 12:00:00 20 mg via INTRAVENOUS
  Filled 2019-07-14: qty 2

## 2019-07-14 MED ORDER — ONDANSETRON IVPB 16 MG IN NS 50 ML
16.00 mg | Freq: Once | INTRAVENOUS | Status: AC
Start: 2019-07-14 — End: 2019-07-14
  Administered 2019-07-14: 12:00:00 16 mg via INTRAVENOUS
  Filled 2019-07-14: qty 50

## 2019-07-14 NOTE — Progress Notes (Signed)
[facility]   Visit Date: 07/14/2019        Leslie [name] is a 49 y.o. female patient of Leslie Fantasia, MD    Since her last visit, she has been doing well.     Overall, she states her energy level is good.  The patient is able to perform most usual functions.. Her appetite is reported to be intact, with no significant changes in weight.. She reports no complaints. She has no other concerns. There are no social work or other referral needs at this time.    Diagnosis: Breast Cancer  Treatment on Clinical Trial: Not On Study (NOS)  [facility](O) - OP Adult - Breast Neoadjuvant - PACLitaxel + CARBOplatin (D1,8,15) Q 21 Days X 4 Cycles  [facility] - OP Adult - Normal Saline Hydration (1000 ml/hr)    Pre-Medications: aprepitant IV (Cinvanti), famotidine (Pepcid) and ondansetron (Zofran) (took benedryl at home, declined decadron)    Line Type: Mediport,    Right Port  Blood Return verified prior to administration: Yes    Vital Signs:    Patient Vitals for the past 12 hrs:   BP Temp Pulse Resp   07/14/19 1104 123/81 98.8 F (37.1 C) (!) 109 18      Body surface area is 2.56 meters squared.    '  Chemistry        Component Value Date/Time    NA 138 07/07/2019 1103    NA 140 06/23/2019 1133    K 3.6 07/07/2019 1103    K 3.8 06/23/2019 1133    CL 101 07/07/2019 1103    CL 104 06/23/2019 1133    CO2 26 07/07/2019 1103    CO2 29 06/23/2019 1133    BUN 12.0 07/07/2019 1103    BUN 12.0 06/23/2019 1133    CREAT 1.1 07/07/2019 1103    CREAT 0.9 06/23/2019 1133    GLU 143 (H) 07/07/2019 1103    GLU 141 (H) 06/23/2019 1133        Component Value Date/Time    CA 9.1 07/07/2019 1103    CA 9.6 06/23/2019 1133    ALKPHOS 79 07/07/2019 1103    AST 19 07/07/2019 1103    AST <25 06/23/2019 1133    ALT 24 07/07/2019 1103    ALT <20 06/23/2019 1133    BILITOTAL 0.3 07/07/2019 1103            Lab Results   Component Value Date    WBC 3.08 (L) 07/14/2019    HGB 9.3 (L) 07/14/2019    PLT 131 (L) 07/14/2019    NEUTROABS 1.86  07/14/2019       Administrations This Visit     aprepitant (CINVANTI) injection 130 mg     Admin Date  07/14/2019  11:43 Action  Given Dose  130 mg Route  Intravenous Ordering Provider  Leslie Fantasia, MD          famotidine (PEPCID) injection 20 mg     Admin Date  07/14/2019  11:46 Action  Given Dose  20 mg Route  Intravenous Ordering Provider  Leslie Fantasia, MD          ondansetron [facility]) 16 mg in sodium chloride 0.9 % 50 mL IVPB (premix)     Admin Date  07/14/2019  11:50 Action  New Bag Dose  16 mg Rate  200 mL/hr Route  Intravenous Ordering Provider  Leslie Fantasia, MD          PACLitaxel (  TAXOL) 210 mg in sodium chloride 0.9 % 320 mL chemo infusion     Admin Date  07/14/2019  12:17 Action  New Bag Dose  210 mg Rate  320 mL/hr Route  Intravenous Ordering Provider  Leslie Fantasia, MD                Chemo Given within the past 12 hours:   "Chemo/Biotherapy Given" (last 12 hours)     Date/Time Action User Medication Dose Rate Dose Volume (mL) Rate    07/14/19 1217 New Bag    AT PACLitaxel (TAXOL) 210 mg in sodium chloride 0.9 % 320 mL chemo infusion 210 mg 320 mL/hr               Hypersensitivity Reaction Noted: No  IV Post Hydration: No, not required  Treatment Outcome:Patient tolerated treatment well. Central Line flushed and deacessed.    Education: Patient/Family educated regarding the expected outcomes/side effects possible interactions of treatments. Patient verbalizes understanding.    Follow-up Plan: Discharged in stable condition. Accompanied by, mother. Instructed to contact physician if any complications or unmanageable symptoms occur.        Leslie Miyamoto, RN    07/14/2019  1:11 PM

## 2019-07-15 ENCOUNTER — Telehealth: Payer: Self-pay | Admitting: Internal Medicine

## 2019-07-15 NOTE — Telephone Encounter (Signed)
Patient calling @ [phone] with c/o chest pain described as pain/pressure mid sternal area, non radiating since this afternoon, 8/10, with some shortness of breath. 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 Patient is audibly short of breath and in discomfort while on the phone. RN advised patient to call 911 or go to nearest UC or ER. Patient states she lives in [place] and won't have her medical history. RN informed patient that she needs to be seen, pt V/U.

## 2019-07-15 NOTE — Telephone Encounter (Signed)
Provider was made aware and agrees with triage RN recommendations. Follow up with patient to verify needs.

## 2019-07-18 NOTE — Progress Notes (Signed)
[facility]  [address] Leslie [name]  [phone]      [name] Office  [phone]  Visit Date: 07/19/2019    Chief Complaint   Patient presents with    Follow-up     Continues managed care for breast cancer with no c/o pain at this time.         HISTORY OF PRESENT ILLNESS:  Reports severe fatigue. She also having a lot of constipation, but too many loose bowel movements when she takes miralax.   Nausea is well controlled with ativan as needed.   Mycelex has not worked for her oral thrush. Dexilant not covered by insurance.     She presented to ED for chest pain last week. EKG normal. Did not stay until work up completed as chest pain resolved.      Oncology History Overview Note   Physician Requesting Consult: de Lawson Radar, Dairl Ponder, MD    Breast Surgeon: Orlene Och, MD  Radiation Oncologist:  Plastic Surgeon:   PCP: Satira Mccallum, MD      Leslie [name] is a 49 y.o.-year-old female-year-old female with newly diagnosed IDC of the left breast.   She presented with abnormal mammogram.     Bilateral screening mammogram 02/2019 showed left breast 3.1 x 2 cm hypoechoic mass and midly enlarged axillary node abnormality.     CNB left breast mass 2 OC and left axillary node 03/07/2019 [facility]) showed:   A) Breast: IDC, G3, ER 0%, PR 0%, Her 2 neu negative (1+) by IHC, Ki 67 80%  B) B) Lymph node, left axillary: benign-appearing lymph node.    Bilateral breast MRI and Genetic consult ordered. Path review pending.       Breast Cancer Risk Factors:  Age at Menarche:  16  Age at 57st FTP:  44    Still menstruating?:  No   Last menses: 2008   Hysterectomy: Yes 2008 TAH+ left oophorectomy   Estrogen Therapy:          Oral Contraceptives: No           Hormone Replacement Therapy:  No         Reproductive Assistance Therapies:  No  Family History of Cancer:            Breast:   Yes maternal aunt age 4, maternal cousin age 61, paternal cousin age 69,       Ashkenazi Jewish Ancestry: No     Genetic testing:  pending       Denies F/C/N/V/diarrhea. No CP/SOB. No HA/visual changes/focal weakness.        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, estrogen receptor negative     04/14/2019 - 06/22/2019 Chemotherapy    cyclophosphamide (CYTOXAN) 1,500 mg in sodium chloride 0.9 % 360 mL chemo infusion, 1,554 mg, Intravenous, Once, 4 of 4 cycles  Administration: 1,500 mg (04/14/2019), 1,500 mg (04/28/2019), 1,500 mg (05/12/2019), 1,500 mg (06/09/2019)  pegfilgrastim (NEULASTA) injection 6 mg, 6 mg, Subcutaneous, Once, 2 of 2 cycles  Administration: 6 mg (04/14/2019), 6 mg (04/29/2019)  pegfilgrastim (NEULASTA) delivery kit 6 mg, 6 mg, Subcutaneous, Once, 3 of 3 cycles  Administration: 6 mg (04/28/2019), 6 mg (05/12/2019), 6 mg (06/09/2019)  DOXOrubicin (ADRIAMYCIN) 155 mg in sodium chloride 0.9 % 362.5 mL chemo infusion, 60 mg/m2 = 155 mg,  Intravenous, Once, 4 of 4 cycles  Administration: 155 mg (04/14/2019), 155 mg (04/28/2019), 155 mg (05/12/2019), 155 mg (06/09/2019)     06/23/2019 -  Chemotherapy    CARBOplatin (PARAPLATIN) 225 mg in sodium chloride 0.9 % 307.5 mL chemo infusion, 225 mg, Intravenous, Once, 2 of 4 cycles  Administration: 225 mg (06/23/2019), 225 mg (06/30/2019), 225 mg (07/07/2019), 225 mg (07/14/2019)  PACLitaxel (TAXOL) 210 mg in sodium chloride 0.9 % 320 mL chemo infusion, 80 mg/m2 = 210 mg, Intravenous, Once, 2 of 4 cycles  Administration: 210 mg (06/23/2019), 210 mg (06/30/2019), 210 mg (07/07/2019), 210 mg (07/14/2019)       Cancer Staging  No matching staging information was found for the patient.    Past Medical History:   Diagnosis Date    Fibroid     Herpes simplex without mention of complication 2003    possible recurrance in 2013    Hypertension     Lymphedema of extremity 11/03/2017    Neurologic disorder     brachial neuritis    Obesity     Urinary tract infection      Vitamin D deficiency      Past Surgical History:   Procedure Laterality Date    HYSTERECTOMY  2008    fibroids, readmitted for pelvic abscess formation POD#3    MEDIPORT CHECK Right 06/08/2019    Procedure: MEDIPORT CHECK;  Surgeon: Rex Kras, MD;  Location: [facility];  Service: Interventional Radiology;  Laterality: Right;    MEDIPORT PLACEMENT N/A 04/08/2019    Procedure: MEDIPORT PLACEMENT;  Surgeon: Pollyann Kennedy, MD;  Location: [facility];  Service: Interventional Radiology;  Laterality: N/A;    OTHER SURGICAL HISTORY  05/04/2015    Obalon balloon - have 3 balloons placed in stomach for weight loss, Dr. Georgann Housekeeper    SALPINGOOPHORECTOMY Right 2008    RSO with hysterectomy    TUBAL LIGATION  02/17/1997     Social History     Socioeconomic History    Marital status: Legally Separated     Spouse name: None    Number of children: None    Years of education: None    Highest education level: None   Occupational History    None   Tobacco Use    Smoking status: Never Smoker    Smokeless tobacco: Never Used   Substance and Sexual Activity    Alcohol use: Yes     Comment: wine - social, 2-4 times a month    Drug use: No    Sexual activity: Yes     Partners: Male   Other Topics Concern    None   Social History Narrative    Has elliptical machine        Lives with boyfriend, daughter home        Son died on 2019/01/27 in CCU            Mom staying with her, good support system 05/30/2019     Social Determinants of Health     Financial Resource Strain:     Difficulty of Paying Living Expenses:    Food Insecurity:     Worried About Programme researcher, broadcasting/film/video in the Last Year:     Barista in the Last Year:    Transportation Needs:     Freight forwarder (Medical):     Lack of Transportation (Non-Medical):    Physical Activity: Insufficiently Active    Days of Exercise per  Week: 7 days    Minutes of Exercise per Session: 20 min   Stress:     Feeling of Stress :    Social Connections:     Frequency of  Communication with Friends and Family:     Frequency of Social Gatherings with Friends and Family:     Attends Religious Services:     Active Member of Clubs or Organizations:     Attends Engineer, structural:     Marital Status:    Intimate Partner Violence:     Fear of Current or Ex-Partner:     Emotionally Abused:     Physically Abused:     Sexually Abused:       Family History   Problem Relation Age of Onset    Hypertension Mother     Coronary artery disease Mother     Heart disease Mother     Hyperlipidemia Mother     Hypertension Father     Diabetes Father     Other Father         cardiac catheterization    Hypertension Sister     Breast cancer Maternal Aunt     Uterine cancer Maternal Aunt     Vaginal cancer Maternal Aunt     Breast cancer Other         maternal cousin    Ovarian cancer Neg Hx        Allergies   Allergen Reactions    Valsartan Other (See Comments)     achiness    Penicillins Hives     Symptoms occurred as child  Onset date: 07-28-2004     has a current medication list which includes the following prescription(s): apixaban, dexamethasone, lidocaine-prilocaine, losartan-hydrochlorothiazide, ondansetron, prochlorperazine, clotrimazole, dexlansoprazole, fluconazole, olanzapine, and tizanidine.        REVIEW OF SYSTEMS: 14 pt ROS reviewed with the patient and negative except for as documented in the HPI. All other systems reviewed.      PHYSICAL EXAM:  BP 97/68 (BP Site: Right arm, Patient Position: Sitting, Cuff Size: Large)    Pulse (!) 118    Temp 98.8 F (37.1 C) (Oral)    Resp 18    Wt 129.5 kg (285 lb 8 oz)    SpO2 100%    BMI 42.14 kg/m     Performance Status:  2 - Ambulatory and capable of all selfcare but unable to carry out any work activities.  Up and about more than 50% of waking hours         General Appearance: Alert, cooperative, no distress, appears stated age  Head:  Normocephalic, without obvious abnormality, atraumatic  Throat: oral thrush    Neck: Supple, symmetrical, trachea midline, no adenopathy;   thyroid:  no enlargement/tenderness/nodules  Back: Symmetric, no curvature, ROM normal, no CVA tenderness  Lungs: Clear to auscultation bilaterally, respirations unlabored  Chest Wall: No tenderness or deformity  Heart: Regular rate and rhythm, S1 and S2 normal, no murmur, rub or gallop  Breast Exam: Deferred  Abdomen: Soft, non-tender, bowel sounds active all four quadrants, no masses, no organomegaly  Extremities: Extremities normal, atraumatic, no cyanosis or edema  Skin:  Skin color, texture, turgor normal, no rashes or lesions  Lymph Nodes:  Cervical, supraclavicular, and axillary nodes normal      Labs Results:  Reviewed in epic today  Lab Results   Component Value Date    WBC 3.08 (L) 07/14/2019    HGB 9.3 (L) 07/14/2019  HCT 27.9 (L) 07/14/2019    MCV 92.4 07/14/2019    PLT 131 (L) 07/14/2019     Lab Results   Component Value Date    CREAT 1.1 07/14/2019    BUN 12.0 07/14/2019    NA 139 07/14/2019    K 3.3 (L) 07/14/2019    CL 103 07/14/2019    CO2 26 07/14/2019     Lab Results   Component Value Date    ALT 41 07/14/2019    AST 27 07/14/2019    ALKPHOS 69 07/14/2019    BILITOTAL 0.3 07/14/2019       Imaging Results: reviewed ultrasound breast      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    C1D1 AC on 04/14/2019. Experienced severe constipation and heartburn.  C2D1 AC on 04/28/2019.   C3D1 AC on  05/12/2019  C4 D1 AC on 4/ 22/ 21   C2 D1 Carbo/ Taxol on 07/14/2019        Followed by cardio-oncology.  BaselineECHO2/23/2021 showed EF 66% and GLS -19%.  ECHOpost C#2 AC 3/17/2021showed EF 64% and GLS -19%.  Echo post C4 AC- 4/19/ 21 showed EF 64% GLS -19. %       Several issues with chemotherapy:   Oral thrush- rx diflucan 100 mg daily x 7 days    Nausea: continue Ativan as needed. No taking zyprexa   Dyspepsia: dexilant not covered by insurance. Has not tried prilosec. rec'ed to try.   Bone pain in  legs: Likely from Taxol. Continue with Tramadol as needed.   Progressed fatigue: will skip chemotherapy this week. resume on 07/28/2019 with taxol only   Hypotension and tachycardia: likely dehydration. Offered IVF, patient declined. She will hold BP meds and continue oral hydration.     Left breast sonogram 07/04/2019 showed reduction in size left breast mass to 1.1 x 0.4 x 0.4 cm.       RTC 08/08/2019 in [facility] at 11 am     [name], MD      LOS Documentation:    Number and Complexity of Problems Addressed:  1 or more chronic illnesses with severe exacerbation, progression, or side effects of treatment    Amount and/or Complexity of Data Reviewed:  - Reviewed the following results from 2021: 1 unique imaging test    Risk of Complications and/or Morbidity or Mortality of Patient Management:  Drug therapy requiring intensive monitoring for toxicity

## 2019-07-19 ENCOUNTER — Ambulatory Visit: Payer: Commercial Managed Care - POS | Attending: Internal Medicine | Admitting: Internal Medicine

## 2019-07-19 ENCOUNTER — Ambulatory Visit: Payer: Commercial Managed Care - POS | Admitting: Internal Medicine

## 2019-07-19 ENCOUNTER — Telehealth: Payer: Self-pay | Admitting: Internal Medicine

## 2019-07-19 ENCOUNTER — Encounter: Payer: Self-pay | Admitting: Internal Medicine

## 2019-07-19 VITALS — BP 97/68 | HR 118 | Temp 98.8°F | Resp 18 | Wt 285.5 lb

## 2019-07-19 DIAGNOSIS — Z171 Estrogen receptor negative status [ER-]: Secondary | ICD-10-CM

## 2019-07-19 DIAGNOSIS — Z5111 Encounter for antineoplastic chemotherapy: Secondary | ICD-10-CM

## 2019-07-19 DIAGNOSIS — C50412 Malignant neoplasm of upper-outer quadrant of left female breast: Secondary | ICD-10-CM

## 2019-07-19 MED ORDER — FLUCONAZOLE 100 MG PO TABS
100.00 mg | ORAL_TABLET | Freq: Every day | ORAL | 0 refills | Status: AC
Start: 2019-07-19 — End: 2019-07-26

## 2019-07-19 NOTE — Telephone Encounter (Signed)
Followed up with patient. She said she called an ambulance on Friday, 5/28 and the EMTs did an EKG and gave her 4 baby aspirin in the ambulance. When she arrived at the hospital [facility]), there were no available rooms and she was waiting in a crowded lobby. She was uncomfortable being in a large crowd the day after her chemo so she went home, saying the EMT told her it didn't look like she was having a heart attack. She said she has not had any discomfort since and she has an appt with Dr. Ladell Heads today. Will update Dr. Ladell Heads before her appt.

## 2019-07-19 NOTE — Telephone Encounter (Signed)
Cancel chemotherapy on 07/21/2019   Resume chemotherapy with paclitaxel only (no carboplatin) on 07/28/2019     Rx diflucan 100 mg daily x 7 days     RTC with me on 08/08/2019 at 11 OC

## 2019-07-19 NOTE — Telephone Encounter (Signed)
MD aware and will review with patient at appointment

## 2019-07-19 NOTE — Telephone Encounter (Signed)
done

## 2019-07-19 NOTE — Telephone Encounter (Signed)
Pt scheduled on 6/21 @ 11am.  Pt aware.

## 2019-07-19 NOTE — Telephone Encounter (Signed)
Patient's infusion appointment adjusted.    Soo, can you update tx plan to be taxol on for 6/10 infusion?  Thank you!

## 2019-07-19 NOTE — Addendum Note (Signed)
Addended by: Berenice Bouton on: 07/19/2019 05:48 PM     Modules accepted: Orders

## 2019-07-21 ENCOUNTER — Ambulatory Visit: Payer: Commercial Managed Care - POS

## 2019-07-21 ENCOUNTER — Encounter (HOSPITAL_BASED_OUTPATIENT_CLINIC_OR_DEPARTMENT_OTHER): Payer: Self-pay | Admitting: Surgery

## 2019-07-21 ENCOUNTER — Encounter (HOSPITAL_BASED_OUTPATIENT_CLINIC_OR_DEPARTMENT_OTHER): Payer: Self-pay

## 2019-07-21 NOTE — Progress Notes (Signed)
Patient's Last Chemo 07/29.    Post Chemo MRI with [facility] on 07/26 @ 11:15 am @ .    Patient aware of all of the changes and understands she will receive a call from our Surgical Scheduler to adjust her follow up after MRI .

## 2019-07-22 ENCOUNTER — Encounter: Payer: Self-pay | Admitting: Internal Medicine

## 2019-07-28 ENCOUNTER — Ambulatory Visit: Payer: Commercial Managed Care - POS | Attending: Internal Medicine

## 2019-07-28 VITALS — BP 136/89 | HR 106 | Temp 98.1°F | Resp 18 | Ht 69.02 in | Wt 297.7 lb

## 2019-07-28 DIAGNOSIS — Z5111 Encounter for antineoplastic chemotherapy: Secondary | ICD-10-CM | POA: Insufficient documentation

## 2019-07-28 DIAGNOSIS — C50412 Malignant neoplasm of upper-outer quadrant of left female breast: Secondary | ICD-10-CM | POA: Insufficient documentation

## 2019-07-28 DIAGNOSIS — Z171 Estrogen receptor negative status [ER-]: Secondary | ICD-10-CM | POA: Insufficient documentation

## 2019-07-28 DIAGNOSIS — E876 Hypokalemia: Secondary | ICD-10-CM | POA: Insufficient documentation

## 2019-07-28 LAB — RRL - CMP
ALT Piccolo(R): 35 U/L (ref 10–47)
AST Piccolo(R): 32 U/L (ref 11–38)
Albumin Piccolo(R): 3.5 g/dL (ref 3.3–5.0)
Alkaline Phosphatase Piccolo(R): 69 U/L (ref 42–141)
BUN Piccolo(R): 10 mg/dL (ref 7.0–19.0)
Bilirubin Total Piccolo(R): 0.6 mg/dL (ref 0.2–1.6)
CO2 Piccolo(R): 32 mEq/L — ABNORMAL HIGH (ref 21–29)
Calcium Piccolo(R): 9.4 mg/dL (ref 8.5–10.5)
Chloride Piccolo(R): 107 mEq/L (ref 98–107)
Creatinine Piccolo(R): 1.4 mg/dL (ref 0.4–1.5)
Glucose Piccolo(R): 110 mg/dL — ABNORMAL HIGH (ref 70–100)
Potassium Piccolo(R): 3.2 mEq/L — ABNORMAL LOW (ref 3.5–5.1)
Protein Piccolo(R): 6.7 g/dL (ref 6.4–8.1)
Sodium Piccolo(R): 137 mEq/L (ref 136–145)

## 2019-07-28 LAB — GFR: GFR Piccolo(R): 40.1

## 2019-07-28 LAB — CBC AND DIFFERENTIAL
Absolute NRBC: 0 10*3/uL (ref 0.00–0.00)
Basophils Absolute Automated: 0.01 10*3/uL (ref 0.00–0.08)
Basophils Automated: 0.3 %
Eosinophils Absolute Automated: 0.02 10*3/uL (ref 0.00–0.44)
Eosinophils Automated: 0.7 %
Hematocrit: 28.3 % — ABNORMAL LOW (ref 34.7–43.7)
Hgb: 9.3 g/dL — ABNORMAL LOW (ref 11.4–14.8)
Immature Granulocytes Absolute: 0 10*3/uL (ref 0.00–0.07)
Immature Granulocytes: 0 %
Lymphocytes Absolute Automated: 1.03 10*3/uL (ref 0.42–3.22)
Lymphocytes Automated: 34.8 %
MCH: 31.4 pg (ref 25.1–33.5)
MCHC: 32.9 g/dL (ref 31.5–35.8)
MCV: 95.6 fL (ref 78.0–96.0)
MPV: 9.5 fL (ref 8.9–12.5)
Monocytes Absolute Automated: 0.36 10*3/uL (ref 0.21–0.85)
Monocytes: 12.2 %
Neutrophils Absolute: 1.54 10*3/uL (ref 1.10–6.33)
Neutrophils: 52 %
Nucleated RBC: 0 /100 WBC (ref 0.0–0.0)
Platelets: 103 10*3/uL — ABNORMAL LOW (ref 142–346)
RBC: 2.96 10*6/uL — ABNORMAL LOW (ref 3.90–5.10)
RDW: 16 % — ABNORMAL HIGH (ref 11–15)
WBC: 2.96 10*3/uL — ABNORMAL LOW (ref 3.10–9.50)

## 2019-07-28 MED ORDER — FAMOTIDINE 10 MG/ML IV SOLN (WRAP)
20.00 mg | Freq: Once | INTRAVENOUS | Status: AC
Start: 2019-07-28 — End: 2019-07-28
  Administered 2019-07-28: 12:00:00 20 mg via INTRAVENOUS
  Filled 2019-07-28: qty 2

## 2019-07-28 MED ORDER — ONDANSETRON IVPB 16 MG IN NS 50 ML
16.00 mg | Freq: Once | INTRAVENOUS | Status: AC
Start: 2019-07-28 — End: 2019-07-28
  Administered 2019-07-28: 12:00:00 16 mg via INTRAVENOUS
  Filled 2019-07-28: qty 50

## 2019-07-28 MED ORDER — SODIUM CHLORIDE 0.9 % IV SOLN
250.00 mL | INTRAVENOUS | Status: DC
Start: 2019-07-28 — End: 2019-07-28
  Administered 2019-07-28: 12:00:00 250 mL via INTRAVENOUS

## 2019-07-28 MED ORDER — APREPITANT 130 MG/18ML IV EMUL
130.00 mg | Freq: Once | INTRAVENOUS | Status: AC
Start: 2019-07-28 — End: 2019-07-28
  Administered 2019-07-28: 12:00:00 130 mg via INTRAVENOUS
  Filled 2019-07-28: qty 18

## 2019-07-28 MED ORDER — POTASSIUM CHLORIDE 2 MEQ/ML IV SOLN
Freq: Once | INTRAVENOUS | Status: AC
Start: 2019-07-28 — End: 2019-07-28
  Filled 2019-07-28: qty 1000

## 2019-07-28 MED ORDER — SODIUM CHLORIDE (PF) 0.9 % IJ SOLN
5.00 mL | INTRAMUSCULAR | Status: DC | PRN
Start: 2019-07-28 — End: 2019-07-28
  Administered 2019-07-28: 15:00:00 10 mL via INTRAVENOUS

## 2019-07-28 MED ORDER — HEPARIN SOD (PORK) LOCK FLUSH 100 UNIT/ML IV SOLN
5.00 mL | INTRAVENOUS | Status: DC | PRN
Start: 2019-07-28 — End: 2019-07-28
  Administered 2019-07-28: 15:00:00 5 mL
  Filled 2019-07-28: qty 5

## 2019-07-28 MED ORDER — PACLITAXEL 6 MG/ML IV CONC (WRAP)
80.00 mg/m2 | Freq: Once | INTRAVENOUS | Status: AC
Start: 2019-07-28 — End: 2019-07-28
  Administered 2019-07-28: 13:00:00 210 mg via INTRAVENOUS
  Filled 2019-07-28: qty 35

## 2019-07-28 NOTE — Progress Notes (Addendum)
PROGRESS NOTE        Brief Note     Patient Name: Leslie [name], Leslie [name]      CC: Hypokalemia  Change in Cr level     Leslie [name] with breast cancer presents to the infusion clinic for Cycle 2, Day 8 Taxol    [facility](O) - OP Adult - Breast Neoadjuvant - PACLitaxel  (D1,8,15) Q 21 Days X 4 Cycles     Oncologist: A. Ladell Heads, MD    Leslie [name] is  well-appearing and not in distress.  Patient reports feeling fair. Reports some appetite decrease, fatigue, worsened hands neuropathy. She denies N/V/D. She reports adequate water intake at approx 2- 2.5 L a day. She  denies fever, chills, dyspnea, cough, chest discomfort, nausea, vomiting, diarrhea, oral health changes, skin rash, oral lesions, worsened  leg edema.     She offers no other concerns or complaints.    Review of Systems     Review of Systems   Constitutional: Positive for malaise/fatigue. Negative for chills and fever.   Respiratory: Negative for cough and shortness of breath.    Cardiovascular: Negative for chest pain, palpitations and leg swelling.   Gastrointestinal: Negative.    Genitourinary: Negative.    Musculoskeletal: Negative.    Skin: Negative.    Neurological: Negative for dizziness, speech change, weakness and headaches.   Psychiatric/Behavioral: Negative.          Assessment / Plan:      1. Hypokalemia  - 20 mEq KCL in 1 L NS over 2 hours    2. Increase in Cr level and decrease in EGFR  - Hydration 1 L NS today  - we will re-check labs on Monday and hydrate if needed      As always, patient was instructed to seek medical care with temperature 100.4 or higher and/or any other concerning S&S. Patient verbalized understanding.    Jovita Gamma, NP

## 2019-07-28 NOTE — Progress Notes (Signed)
[facility]   Visit Date: 07/28/2019        Leslie [name] is a 49 y.o. female patient of [name], MD    Since her last visit, she has been doing fair.   Patient presents to the infusion center for Cycle 2, Day 8 Taxol.   Overall, she states her energy level is good.  The patient is able to perform most usual functions.. Her decreased appetite with an associated weight loss of 0 lbs..  Pt states she is eating ok and trying to drink more.  She reports some fatigue but feels the extra week off was helpful.   Pt c/o a HA 4/10 today.  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  Pt denies any more chest pain since she went to the ED several weeks ago.  Pt denies n/v/SOB/chest pain/fever.  Labs drawn, CBC and CMP reviewed.  Criteria to treat met, copy of results given to pt.     *Reviewed results with [name], NP, K 3.2.  [name] ordered 20 meq IV K in 1L NS to be given over 2 hrs.  Pt will also return Monday for lab recheck and possible hydration.  Pt notified of plan.*    IVF infused over 2 hrs, pt tol well.    Diagnosis: Breast Cancer  Treatment on Clinical Trial: Not On Study (NOS)  [facility](O) - OP Adult - Breast Neoadjuvant - PACLitaxel + CARBOplatin (D1,8,15) Q 21 Days X 4 Cycles (Taxol only from C2D8))  [facility] - OP Adult - Normal Saline Hydration (1000 ml/hr)      IV Pre Hydration: N/A  Urine Parameters: N/A  Pre-Medications:  aprepitant IV (Cinvanti), Pepcid IV and ondansetron (Zofran)- pt took benadryl at home prior to arrival    Line Type: Mediport,    Right Port accessed per protocol  Blood Return verified prior to administration: Yes    Vital Signs:    Patient Vitals for the past 12 hrs:   BP Temp Pulse Resp   07/28/19 1052 136/89 98.1 F (36.7 C) (!) 106 18      Body surface area is 2.56 meters squared.    '  Chemistry        Component Value Date/Time    NA 137 07/28/2019 1059    K 3.2 (L) 07/28/2019 1059    CL 107  07/28/2019 1059    CO2 32 (H) 07/28/2019 1059    BUN 10.0 07/28/2019 1059    CREAT 1.4 07/28/2019 1059    GLU 110 (H) 07/28/2019 1059        Component Value Date/Time    CA 9.4 07/28/2019 1059    ALKPHOS 69 07/14/2019 1101    AST 32 07/28/2019 1059    ALT 35 07/28/2019 1059    BILITOTAL 0.3 07/14/2019 1101            Lab Results   Component Value Date    WBC 2.96 (L) 07/28/2019    HGB 9.3 (L) 07/28/2019    PLT 103 (L) 07/28/2019    NEUTROABS 1.54 07/28/2019       Administrations This Visit     0.9% NaCl infusion     Admin Date  07/28/2019  11:43 Action  New Bag Dose  250 mL Rate  20 mL/hr Route  Intravenous Ordering Provider  [name], MD          aprepitant (CINVANTI) injection 130  mg     Admin Date  07/28/2019  11:44 Action  Given Dose  130 mg Route  Intravenous Ordering Provider  [name], MD          famotidine (PEPCID) injection 20 mg     Admin Date  07/28/2019  11:49 Action  Given Dose  20 mg Route  Intravenous Ordering Provider  [name], MD          ondansetron [facility]) 16 mg in sodium chloride 0.9 % 50 mL IVPB (premix)     Admin Date  07/28/2019  11:51 Action  New Bag Dose  16 mg Rate  200 mL/hr Route  Intravenous Ordering Provider  [name], MD                Chemo Given within the past 12 hours:   "Chemo/Biotherapy Given" (last 12 hours)     Date/Time Action User Medication Dose Rate Dose Volume (mL) Rate    07/28/19 1504 Given    AK heparin 100 UNIT/ML flush 5 mL 5 mL        07/28/19 1348 Stopped    AK PACLitaxel (TAXOL) 210 mg in sodium chloride 0.9 % 320 mL chemo infusion  0 mL/hr       07/28/19 1248 New Bag    AK PACLitaxel (TAXOL) 210 mg in sodium chloride 0.9 % 320 mL chemo infusion 210 mg 320 mL/hr               Hypersensitivity Reaction Noted: No  IV Post Hydration: No, not required  Treatment Outcome:Patient tolerated treatment well. Pt using cryotherapy on hands and feet during Taxol.  Flushed port with NS and heparin and de-accessed per protocol. Site without redness  or inflammation, band-aid applied.     Education: Patient/Family educated regarding the expected outcomes/side effects possible interactions of treatments. Patient verbalizes understanding.    Follow-up Plan: Discharged in stable condition. Accompanied by, husband. Instructed to contact physician if any complications or unmanageable symptoms occur.    A return to the Infusion Center for the next treatment, has been scheduled.  RTC 08/01/2019.      [name], RN    07/28/2019  3:42 PM

## 2019-07-29 ENCOUNTER — Encounter: Payer: Self-pay | Admitting: Internal Medicine

## 2019-08-01 ENCOUNTER — Ambulatory Visit: Payer: Commercial Managed Care - POS

## 2019-08-03 ENCOUNTER — Telehealth: Payer: Self-pay

## 2019-08-03 NOTE — Telephone Encounter (Signed)
[facility] Social Worker Case Manager   08/03/2019    Follow up Note:     Patient contacted SW regarding resources for affording apartments.   SW directed the patient to her county/ social services for housing information.     Linus Orn, MSW   [facility] Social Worker Case Manager   [phone]

## 2019-08-04 ENCOUNTER — Ambulatory Visit: Payer: Commercial Managed Care - POS | Attending: Internal Medicine

## 2019-08-04 VITALS — BP 133/82 | HR 103 | Temp 98.4°F | Resp 20 | Wt 284.7 lb

## 2019-08-04 DIAGNOSIS — E876 Hypokalemia: Secondary | ICD-10-CM

## 2019-08-04 DIAGNOSIS — C50412 Malignant neoplasm of upper-outer quadrant of left female breast: Secondary | ICD-10-CM | POA: Insufficient documentation

## 2019-08-04 DIAGNOSIS — Z171 Estrogen receptor negative status [ER-]: Secondary | ICD-10-CM | POA: Insufficient documentation

## 2019-08-04 DIAGNOSIS — Z5111 Encounter for antineoplastic chemotherapy: Secondary | ICD-10-CM | POA: Insufficient documentation

## 2019-08-04 LAB — RRL - CMP
ALT Piccolo(R): 44 U/L (ref 10–47)
AST Piccolo(R): 39 U/L — ABNORMAL HIGH (ref 11–38)
Albumin Piccolo(R): 3.8 g/dL (ref 3.3–5.0)
Alkaline Phosphatase Piccolo(R): 77 U/L (ref 42–141)
BUN Piccolo(R): 13 mg/dL (ref 7.0–19.0)
Bilirubin Total Piccolo(R): 0.6 mg/dL (ref 0.2–1.6)
CO2 Piccolo(R): 33 mEq/L — ABNORMAL HIGH (ref 21–29)
Calcium Piccolo(R): 9.3 mg/dL (ref 8.5–10.5)
Chloride Piccolo(R): 101 mEq/L (ref 98–107)
Creatinine Piccolo(R): 1.4 mg/dL (ref 0.4–1.5)
Glucose Piccolo(R): 113 mg/dL — ABNORMAL HIGH (ref 70–100)
Potassium Piccolo(R): 3.7 mEq/L (ref 3.5–5.1)
Protein Piccolo(R): 7.3 g/dL (ref 6.4–8.1)
Sodium Piccolo(R): 138 mEq/L (ref 136–145)

## 2019-08-04 LAB — CBC AND DIFFERENTIAL
Absolute NRBC: 0 10*3/uL (ref 0.00–0.00)
Basophils Absolute Automated: 0 10*3/uL (ref 0.00–0.08)
Basophils Automated: 0 %
Eosinophils Absolute Automated: 0.02 10*3/uL (ref 0.00–0.44)
Eosinophils Automated: 0.6 %
Hematocrit: 29.2 % — ABNORMAL LOW (ref 34.7–43.7)
Hgb: 9.6 g/dL — ABNORMAL LOW (ref 11.4–14.8)
Immature Granulocytes Absolute: 0 10*3/uL (ref 0.00–0.07)
Immature Granulocytes: 0 %
Lymphocytes Absolute Automated: 1.42 10*3/uL (ref 0.42–3.22)
Lymphocytes Automated: 42.4 %
MCH: 31.9 pg (ref 25.1–33.5)
MCHC: 32.9 g/dL (ref 31.5–35.8)
MCV: 97 fL — ABNORMAL HIGH (ref 78.0–96.0)
MPV: 10 fL (ref 8.9–12.5)
Monocytes Absolute Automated: 0.25 10*3/uL (ref 0.21–0.85)
Monocytes: 7.5 %
Neutrophils Absolute: 1.66 10*3/uL (ref 1.10–6.33)
Neutrophils: 49.5 %
Nucleated RBC: 0 /100 WBC (ref 0.0–0.0)
Platelets: 253 10*3/uL (ref 142–346)
RBC: 3.01 10*6/uL — ABNORMAL LOW (ref 3.90–5.10)
RDW: 15 % (ref 11–15)
WBC: 3.35 10*3/uL (ref 3.10–9.50)

## 2019-08-04 LAB — GFR: GFR Piccolo(R): 40.1

## 2019-08-04 MED ORDER — HEPARIN SOD (PORK) LOCK FLUSH 100 UNIT/ML IV SOLN
5.00 mL | INTRAVENOUS | Status: DC | PRN
Start: 2019-08-04 — End: 2019-08-04
  Administered 2019-08-04: 16:00:00 5 mL
  Filled 2019-08-04: qty 5

## 2019-08-04 MED ORDER — SODIUM CHLORIDE (PF) 0.9 % IJ SOLN
5.00 mL | INTRAMUSCULAR | Status: DC | PRN
Start: 2019-08-04 — End: 2019-08-04
  Administered 2019-08-04: 16:00:00 10 mL via INTRAVENOUS

## 2019-08-04 MED ORDER — SODIUM CHLORIDE 0.9 % IV SOLN
250.00 mL | INTRAVENOUS | Status: DC
Start: 2019-08-04 — End: 2019-08-04
  Administered 2019-08-04: 14:00:00 250 mL via INTRAVENOUS

## 2019-08-04 MED ORDER — PACLITAXEL 6 MG/ML IV CONC (WRAP)
80.00 mg/m2 | Freq: Once | INTRAVENOUS | Status: AC
Start: 2019-08-04 — End: 2019-08-04
  Administered 2019-08-04: 15:00:00 210 mg via INTRAVENOUS
  Filled 2019-08-04: qty 35

## 2019-08-04 MED ORDER — ONDANSETRON IVPB 16 MG IN NS 50 ML
16.00 mg | Freq: Once | INTRAVENOUS | Status: AC
Start: 2019-08-04 — End: 2019-08-04
  Administered 2019-08-04: 14:00:00 16 mg via INTRAVENOUS
  Filled 2019-08-04: qty 50

## 2019-08-04 MED ORDER — FAMOTIDINE 10 MG/ML IV SOLN (WRAP)
20.00 mg | Freq: Once | INTRAVENOUS | Status: AC
Start: 2019-08-04 — End: 2019-08-04
  Administered 2019-08-04: 14:00:00 20 mg via INTRAVENOUS
  Filled 2019-08-04: qty 2

## 2019-08-04 NOTE — Progress Notes (Signed)
[facility]   Visit Date: 08/04/2019        Leslie [name] is a 49 y.o. female patient of Leslie Fantasia, MD    Since her last visit, she has been doing fair.   Patient presents to the infusion center for Cycle 2, Day 15 Taxol.   Overall, she states her energy level is good.  The patient is able to perform most usual functions.. Her appetite is intact, however pt has a decrease in weight of 13 lbs.. She reports a dry cough that has been present for the past 2 weeks, continued neuropathy unchanged and fatigue.  Pt states when she coughs she has some discomfort at the bottom of her rib cage that resolves after coughing.  Pt c/o both intermittent diarrhea and constipation, but states BMs are normal today.  Pt states she has been trying to eat more and stay hydrated, despite weight loss over the week.  Pt states this has happened to her before where her weight fluctuates large amounts.  Edema still seen in pt's bilateral feet, however it appears less than last week.  Pt denies n/v/chest pain/fever.  Labs drawn, CBC and CMP reviewed.  Criteria to treat met, copy of results given to pt.  Leslie Cave, NP assessed pt, lungs clear, pt will monitor and call with worsening cough or other new symptoms.*     Diagnosis: Breast Cancer  Treatment on Clinical Trial: Not On Study (NOS)  [facility](O) - OP Adult - Breast Neoadjuvant - PACLitaxel + CARBOplatin (D1,8,15) Q 21 Days X 4 Cycles (Taxol only from C2D8))  [facility] - OP Adult - Normal Saline Hydration (1000 ml/hr)      IV Pre Hydration: N/A  Urine Parameters: N/A  Pre-Medications: famotidine (Pepcid) and ondansetron (Zofran)- pt took benadryl prior to arrival    Line Type: Mediport,    Right Port accessed per protocol  Blood Return verified prior to administration: Yes    Vital Signs:    Patient Vitals for the past 12 hrs:   BP Temp Pulse Resp   08/04/19 1304 133/82 98.4 F (36.9 C) (!) 103 20      Body surface area is 2.51 meters squared.    '  Chemistry         Component Value Date/Time    NA 138 08/04/2019 1306    K 3.7 08/04/2019 1306    CL 101 08/04/2019 1306    CO2 33 (H) 08/04/2019 1306    BUN 13.0 08/04/2019 1306    CREAT 1.4 08/04/2019 1306    GLU 113 (H) 08/04/2019 1306        Component Value Date/Time    CA 9.3 08/04/2019 1306    ALKPHOS 69 07/14/2019 1101    AST 39 (H) 08/04/2019 1306    ALT 44 08/04/2019 1306    BILITOTAL 0.3 07/14/2019 1101            Lab Results   Component Value Date    WBC 3.35 08/04/2019    HGB 9.6 (L) 08/04/2019    PLT 253 08/04/2019    NEUTROABS 1.66 08/04/2019       Administrations This Visit     0.9% NaCl infusion     Admin Date  08/04/2019  14:09 Action  New Bag Dose  250 mL Rate  20 mL/hr Route  Intravenous Ordering Provider  Leslie Fantasia, MD          famotidine (PEPCID) injection 20 mg  Admin Date  08/04/2019  14:09 Action  Given Dose  20 mg Route  Intravenous Ordering Provider  Leslie Fantasia, MD          ondansetron Leslie [name]) 16 mg in sodium chloride 0.9 % 50 mL IVPB (premix)     Admin Date  08/04/2019  14:11 Action  New Bag Dose  16 mg Rate  200 mL/hr Route  Intravenous Ordering Provider  Leslie Fantasia, MD          PACLitaxel (TAXOL) 210 mg in sodium chloride 0.9 % 320 mL chemo infusion     Admin Date  08/04/2019  14:40 Action  New Bag Dose  210 mg Rate  320 mL/hr Route  Intravenous Ordering Provider  Leslie Fantasia, MD                Chemo Given within the past 12 hours:   "Chemo/Biotherapy Given" (last 12 hours)     Date/Time Action User Medication Dose Rate Dose Volume (mL) Rate    08/04/19 1545 Given    AK heparin 100 UNIT/ML flush 5 mL 5 mL        08/04/19 1540 Stopped    AK PACLitaxel (TAXOL) 210 mg in sodium chloride 0.9 % 320 mL chemo infusion  0 mL/hr       08/04/19 1440 New Bag    AK PACLitaxel (TAXOL) 210 mg in sodium chloride 0.9 % 320 mL chemo infusion 210 mg 320 mL/hr               Hypersensitivity Reaction Noted: No  IV Post Hydration: No, not required  Treatment Outcome:Patient tolerated treatment well.  Pt using cryotherapy on hands and feet during Taxol.  Flushed port with NS and heparin and de-accessed per protocol.  Site without redness or inflammation, band-aid applied.      Education: Patient/Family educated regarding the expected outcomes/side effects possible interactions of treatments. Patient verbalizes understanding.    Follow-up Plan: Discharged in stable condition. Accompanied by, mother. Instructed to contact physician if any complications or unmanageable symptoms occur.    A return to the Infusion Center for the next treatment, has been scheduled.  RTC 08/11/2019.      [name], RN    08/04/2019  4:40 PM

## 2019-08-08 ENCOUNTER — Other Ambulatory Visit: Payer: Self-pay | Admitting: Internal Medicine

## 2019-08-08 ENCOUNTER — Ambulatory Visit: Payer: Commercial Managed Care - POS | Admitting: Internal Medicine

## 2019-08-08 DIAGNOSIS — Z171 Estrogen receptor negative status [ER-]: Secondary | ICD-10-CM

## 2019-08-08 DIAGNOSIS — Z5111 Encounter for antineoplastic chemotherapy: Secondary | ICD-10-CM

## 2019-08-09 ENCOUNTER — Encounter: Payer: Self-pay | Admitting: Internal Medicine

## 2019-08-11 ENCOUNTER — Ambulatory Visit: Payer: Commercial Managed Care - POS | Attending: Internal Medicine

## 2019-08-11 VITALS — BP 115/76 | HR 109 | Temp 98.3°F | Wt 288.6 lb

## 2019-08-11 DIAGNOSIS — C50412 Malignant neoplasm of upper-outer quadrant of left female breast: Secondary | ICD-10-CM

## 2019-08-11 DIAGNOSIS — Z171 Estrogen receptor negative status [ER-]: Secondary | ICD-10-CM | POA: Insufficient documentation

## 2019-08-11 DIAGNOSIS — Z5111 Encounter for antineoplastic chemotherapy: Secondary | ICD-10-CM | POA: Insufficient documentation

## 2019-08-11 LAB — CBC AND DIFFERENTIAL
Absolute NRBC: 0 10*3/uL (ref 0.00–0.00)
Basophils Absolute Automated: 0.02 10*3/uL (ref 0.00–0.08)
Basophils Automated: 0.6 %
Eosinophils Absolute Automated: 0.01 10*3/uL (ref 0.00–0.44)
Eosinophils Automated: 0.3 %
Hematocrit: 27.2 % — ABNORMAL LOW (ref 34.7–43.7)
Hgb: 9.3 g/dL — ABNORMAL LOW (ref 11.4–14.8)
Immature Granulocytes Absolute: 0.02 10*3/uL (ref 0.00–0.07)
Immature Granulocytes: 0.6 %
Lymphocytes Absolute Automated: 1.1 10*3/uL (ref 0.42–3.22)
Lymphocytes Automated: 35.6 %
MCH: 33 pg (ref 25.1–33.5)
MCHC: 34.2 g/dL (ref 31.5–35.8)
MCV: 96.5 fL — ABNORMAL HIGH (ref 78.0–96.0)
MPV: 10.3 fL (ref 8.9–12.5)
Monocytes Absolute Automated: 0.28 10*3/uL (ref 0.21–0.85)
Monocytes: 9.1 %
Neutrophils Absolute: 1.66 10*3/uL (ref 1.10–6.33)
Neutrophils: 53.8 %
Nucleated RBC: 0 /100 WBC (ref 0.0–0.0)
Platelets: 285 10*3/uL (ref 142–346)
RBC: 2.82 10*6/uL — ABNORMAL LOW (ref 3.90–5.10)
RDW: 15 % (ref 11–15)
WBC: 3.09 10*3/uL — ABNORMAL LOW (ref 3.10–9.50)

## 2019-08-11 LAB — COMPREHENSIVE METABOLIC PANEL
ALT: 33 U/L (ref 0–55)
AST (SGOT): 24 U/L (ref 5–34)
Albumin/Globulin Ratio: 1.3 (ref 0.9–2.2)
Albumin: 3.7 g/dL (ref 3.5–5.0)
Alkaline Phosphatase: 78 U/L (ref 37–106)
Anion Gap: 10 (ref 5.0–15.0)
BUN: 10 mg/dL (ref 7.0–19.0)
Bilirubin, Total: 0.3 mg/dL (ref 0.2–1.2)
CO2: 26 mEq/L (ref 21–29)
Calcium: 9 mg/dL (ref 8.5–10.5)
Chloride: 103 mEq/L (ref 100–111)
Creatinine: 1 mg/dL (ref 0.4–1.5)
Globulin: 2.9 g/dL (ref 2.0–3.7)
Glucose: 113 mg/dL — ABNORMAL HIGH (ref 70–100)
Potassium: 3.5 mEq/L (ref 3.5–5.1)
Protein, Total: 6.6 g/dL (ref 6.0–8.3)
Sodium: 139 mEq/L (ref 136–145)

## 2019-08-11 LAB — GFR: EGFR: >60

## 2019-08-11 LAB — HEMOLYSIS INDEX: Hemolysis Index: 2 (ref 0–18)

## 2019-08-11 MED ORDER — DIPHENHYDRAMINE HCL 25 MG PO CAPS
25.00 mg | ORAL_CAPSULE | Freq: Once | ORAL | Status: DC | PRN
Start: 2019-08-11 — End: 2019-08-11

## 2019-08-11 MED ORDER — SODIUM CHLORIDE 0.9 % IV EXCEL
80.00 mg/m2 | Freq: Once | INTRAVENOUS | Status: AC
Start: 2019-08-11 — End: 2019-08-11
  Administered 2019-08-11: 13:00:00 210 mg via INTRAVENOUS
  Filled 2019-08-11: qty 35

## 2019-08-11 MED ORDER — SODIUM CHLORIDE (PF) 0.9 % IJ SOLN
5.00 mL | INTRAMUSCULAR | Status: DC | PRN
Start: 2019-08-11 — End: 2019-08-11

## 2019-08-11 MED ORDER — HEPARIN SOD (PORK) LOCK FLUSH 100 UNIT/ML IV SOLN
5.00 mL | INTRAVENOUS | Status: DC | PRN
Start: 2019-08-11 — End: 2019-08-11
  Administered 2019-08-11: 14:00:00 5 mL

## 2019-08-11 MED ORDER — FAMOTIDINE 10 MG/ML IV SOLN (WRAP)
20.00 mg | Freq: Once | INTRAVENOUS | Status: AC
Start: 2019-08-11 — End: 2019-08-11
  Administered 2019-08-11: 12:00:00 20 mg via INTRAVENOUS
  Filled 2019-08-11: qty 2

## 2019-08-11 MED ORDER — ONDANSETRON IVPB 16 MG IN NS 50 ML
16.00 mg | Freq: Once | INTRAVENOUS | Status: AC
Start: 2019-08-11 — End: 2019-08-11
  Administered 2019-08-11: 12:00:00 16 mg via INTRAVENOUS
  Filled 2019-08-11: qty 50

## 2019-08-11 NOTE — Progress Notes (Signed)
[facility]   Visit Date: 08/11/2019        Leslie [name] is a 49 y.o. female patient of [name], MD      C3D1 Taxol (Carbo was discontinued)      Since her last visit, she has been doing well.     Overall, she states her energy level is good.  The patient is able to perform most usual functions.. Her appetite is reported to be intact, with no significant changes in weight.. She reports no complaints. She has no other concerns. There are no social work or other referral needs at this time.    Diagnosis: Breast Cancer  Treatment on Clinical Trial: Not On Study (NOS)  [facility](O) - OP Adult - Breast Neoadjuvant - PACLitaxel + CARBOplatin (D1,8,15) Q 21 Days X 4 Cycles (Taxol only from C2D8))  [facility] - OP Adult - Normal Saline Hydration (1000 ml/hr)      Pre-Medications: famotidine (Pepcid) and ondansetron (Zofran)  (took antihistamine at home 30 min ago)  Line Type: Mediport,    Left Port  Blood Return verified prior to administration: Yes    Vital Signs:  No data found.   There is no height or weight on file to calculate BSA.    '  Chemistry        Component Value Date/Time    NA 138 08/04/2019 1306    K 3.7 08/04/2019 1306    CL 101 08/04/2019 1306    CO2 33 (H) 08/04/2019 1306    BUN 13.0 08/04/2019 1306    CREAT 1.4 08/04/2019 1306    GLU 113 (H) 08/04/2019 1306        Component Value Date/Time    CA 9.3 08/04/2019 1306    ALKPHOS 69 07/14/2019 1101    AST 39 (H) 08/04/2019 1306    ALT 44 08/04/2019 1306    BILITOTAL 0.3 07/14/2019 1101            Lab Results   Component Value Date    WBC 3.35 08/04/2019    HGB 9.6 (L) 08/04/2019    PLT 253 08/04/2019    NEUTROABS 1.66 08/04/2019           Chemo Given within the past 12 hours:   "Chemo/Biotherapy Given" (last 12 hours)     None            Hypersensitivity Reaction Noted: No  IV Post Hydration: No, not required  Treatment Outcome:Patient tolerated treatment well. Central Line flushed and deacessed.    Education: Patient/Family educated  regarding the expected outcomes/side effects possible interactions of treatments. Patient verbalizes understanding.    Follow-up Plan: Discharged in stable condition. Accompanied by, mother. Instructed to contact physician if any complications or unmanageable symptoms occur.        [name], RN    08/11/2019  8:02 AM

## 2019-08-14 NOTE — Progress Notes (Signed)
[facility]  [address] [name]  [phone]      [name] Office  [phone]  Visit Date: 08/17/2019    Chief Complaint   Patient presents with    Follow-up     Continues managed care for breast cancer with no pain at this time.         HISTORY OF PRESENT ILLNESS:  Still feels very tired but overall chemotherapy is more manageable with the single agent Taxol than combo carbo/taxol.   Reports occasional he epistaxis and bright red blood per rectum.  Also new dry cough with no associated fever/SOB.     Oncology History Overview Note   Physician Requesting Consult: de Lawson Radar, Dairl Ponder, MD    Breast Surgeon: Orlene Och, MD  Radiation Oncologist:  Plastic Surgeon:   PCP: Satira Mccallum, MD      [name] is a 49 y.o.-year-old female with newly diagnosed IDC of the left breast.   She presented with abnormal mammogram.     Bilateral screening mammogram 02/2019 showed left breast 3.1 x 2 cm hypoechoic mass and midly enlarged axillary node abnormality.     CNB left breast mass 2 OC and left axillary node 03/07/2019 [facility]) showed:   A) Breast: IDC, G3, ER 0%, PR 0%, Her 2 neu negative (1+) by IHC, Ki 67 80%  B) B) Lymph node, left axillary: benign-appearing lymph node.    Bilateral breast MRI and Genetic consult ordered. Path review pending.       Breast Cancer Risk Factors:  Age at Menarche:  38  Age at 42st FTP:  66    Still menstruating?:  No   Last menses: 2008   Hysterectomy: Yes 2008 TAH+ left oophorectomy   Estrogen Therapy:          Oral Contraceptives: No           Hormone Replacement Therapy:  No         Reproductive Assistance Therapies:  No  Family History of Cancer:            Breast:   Yes maternal aunt age 29, maternal cousin age 52, paternal cousin age 31,       Ashkenazi Jewish Ancestry: No     Genetic testing: pending       Denies F/C/N/V/diarrhea. No CP/SOB. No HA/visual changes/focal weakness.        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, estrogen receptor negative   03/29/2019 Initial Diagnosis    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, estrogen receptor negative     04/14/2019 - 06/22/2019 Chemotherapy    cyclophosphamide (CYTOXAN) 1,500 mg in sodium chloride 0.9 % 360 mL chemo infusion, 1,554 mg, Intravenous, Once, 4 of 4 cycles  Administration: 1,500 mg (04/14/2019), 1,500 mg (04/28/2019), 1,500 mg (05/12/2019), 1,500 mg (06/09/2019)  pegfilgrastim (NEULASTA) injection 6 mg, 6 mg, Subcutaneous, Once, 2 of 2 cycles  Administration: 6 mg (04/14/2019), 6 mg (04/29/2019)  pegfilgrastim (NEULASTA) delivery kit 6 mg, 6 mg, Subcutaneous, Once, 3 of 3 cycles  Administration: 6 mg (04/28/2019), 6 mg (05/12/2019), 6 mg (06/09/2019)  DOXOrubicin (ADRIAMYCIN) 155 mg in sodium chloride 0.9 % 362.5 mL chemo infusion, 60 mg/m2 = 155 mg, Intravenous, Once, 4 of 4 cycles  Administration: 155 mg (04/14/2019), 155 mg (04/28/2019), 155 mg (05/12/2019), 155 mg (06/09/2019)     06/23/2019 -  Chemotherapy    CARBOplatin (PARAPLATIN) 225  mg in sodium chloride 0.9 % 307.5 mL chemo infusion, 225 mg, Intravenous, Once, 2 of 2 cycles  Administration: 225 mg (06/23/2019), 225 mg (06/30/2019), 225 mg (07/07/2019), 225 mg (07/14/2019)  PACLitaxel (TAXOL) 210 mg in sodium chloride 0.9 % 320 mL chemo infusion, 80 mg/m2 = 210 mg, Intravenous, Once, 3 of 4 cycles  Administration: 210 mg (06/23/2019), 210 mg (06/30/2019), 210 mg (07/07/2019), 210 mg (07/14/2019), 210 mg (07/28/2019), 210 mg (08/04/2019), 210 mg (08/11/2019)       Cancer Staging  No matching staging information was found for the patient.    Past Medical History:   Diagnosis Date    Fibroid     Herpes simplex without mention of complication 2003    possible recurrance in 2013    Hypertension     Lymphedema of extremity 11/03/2017    Neurologic disorder     brachial neuritis    Obesity     Urinary tract infection     Vitamin D deficiency      Past Surgical History:   Procedure Laterality Date    HYSTERECTOMY   2008    fibroids, readmitted for pelvic abscess formation POD#3    MEDIPORT CHECK Right 06/08/2019    Procedure: MEDIPORT CHECK;  Surgeon: Rex Kras, MD;  Location: [facility];  Service: Interventional Radiology;  Laterality: Right;    MEDIPORT PLACEMENT N/A 04/08/2019    Procedure: MEDIPORT PLACEMENT;  Surgeon: Pollyann Kennedy, MD;  Location: [facility];  Service: Interventional Radiology;  Laterality: N/A;    OTHER SURGICAL HISTORY  05/04/2015    Obalon balloon - have 3 balloons placed in stomach for weight loss, Dr. Georgann Housekeeper    SALPINGOOPHORECTOMY Right 2008    RSO with hysterectomy    TUBAL LIGATION  02/17/1997     Social History     Socioeconomic History    Marital status: Legally Separated     Spouse name: None    Number of children: None    Years of education: None    Highest education level: None   Occupational History    None   Tobacco Use    Smoking status: Never Smoker    Smokeless tobacco: Never Used   Vaping Use    Vaping Use: Never used   Substance and Sexual Activity    Alcohol use: Yes     Comment: wine - social, 2-4 times a month    Drug use: No    Sexual activity: Yes     Partners: Male   Other Topics Concern    None   Social History Narrative    Has elliptical machine        Lives with boyfriend, daughter home        Son died on Jan 21, 2019 in CCU            Mom staying with her, good support system 05/30/2019     Social Determinants of Health     Financial Resource Strain:     Difficulty of Paying Living Expenses:    Food Insecurity:     Worried About Programme researcher, broadcasting/film/video in the Last Year:     Barista in the Last Year:    Transportation Needs:     Freight forwarder (Medical):     Lack of Transportation (Non-Medical):    Physical Activity: Insufficiently Active    Days of Exercise per Week: 7 days    Minutes of Exercise per Session: 20 min  Stress:     Feeling of Stress :    Social Connections:     Frequency of Communication with Friends and Family:     Frequency  of Social Gatherings with Friends and Family:     Attends Religious Services:     Active Member of Clubs or Organizations:     Attends Engineer, structural:     Marital Status:    Intimate Partner Violence:     Fear of Current or Ex-Partner:     Emotionally Abused:     Physically Abused:     Sexually Abused:       Family History   Problem Relation Age of Onset    Hypertension Mother     Coronary artery disease Mother     Heart disease Mother     Hyperlipidemia Mother     Hypertension Father     Diabetes Father     Other Father         cardiac catheterization    Hypertension Sister     Breast cancer Maternal Aunt     Uterine cancer Maternal Aunt     Vaginal cancer Maternal Aunt     Breast cancer Other         maternal cousin    Ovarian cancer Neg Hx        Allergies   Allergen Reactions    Valsartan Other (See Comments)     achiness    Penicillins Hives     Symptoms occurred as child  Onset date: 07-28-2004     has a current medication list which includes the following prescription(s): apixaban, dexamethasone, losartan-hydrochlorothiazide, olanzapine, ondansetron, prochlorperazine, clotrimazole, dexlansoprazole, lidocaine-prilocaine, nystatin, and tizanidine.        REVIEW OF SYSTEMS: 14 pt ROS reviewed with the patient and negative except for as documented in the HPI. All other systems reviewed.      PHYSICAL EXAM:  BP 139/84 (BP Site: Right arm, Patient Position: Sitting, Cuff Size: Large)    Pulse 98    Temp 97.9 F (36.6 C) (Temporal)    Resp 18    Wt 129.5 kg (285 lb 9.6 oz)    SpO2 99%    BMI 42.16 kg/m     Performance Status:  0 - Fully active, able to carry on all pre-disease performance without restriction         General Appearance: Alert, cooperative, no distress, appears stated age   HEENT: oral thrush   Lungs: Clear to auscultation bilaterally, respirations unlabored  Chest Wall: No tenderness or deformity  Heart: Regular rate and rhythm, S1 and S2 normal, no murmur, rub  or gallop  Breast Exam: Deferred  Abdomen: Soft, non-tender, bowel sounds active all four quadrants, no masses, no organomegaly  Extremities: Extremities normal, atraumatic, no cyanosis or edema  Skin:  Skin color, texture, turgor normal, no rashes or lesions  Lymph Nodes:  Cervical, supraclavicular, and axillary nodes normal    Labs Results:  Reviewed in epic today  Lab Results   Component Value Date    WBC 3.09 (L) 08/11/2019    HGB 9.3 (L) 08/11/2019    HCT 27.2 (L) 08/11/2019    MCV 96.5 (H) 08/11/2019    PLT 285 08/11/2019     Lab Results   Component Value Date    CREAT 1.0 08/11/2019    BUN 10.0 08/11/2019    NA 139 08/11/2019    K 3.5 08/11/2019    CL 103 08/11/2019  CO2 26 08/11/2019     Lab Results   Component Value Date    ALT 33 08/11/2019    AST 24 08/11/2019    ALKPHOS 78 08/11/2019    BILITOTAL 0.3 08/11/2019       Imaging Results: none this visit     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    C1D1 AC on 04/14/2019.   C4D1 AC on 06/09/19   Started carbotaxol on 06/23/2019. Was unable to tolerate.   Dropped carbo and continued with single agent paclitaxel on C2D8  C3D1 Taxol on 08/11/19. Continue weekly taxol.       Followed by cardio-oncology.  BaselineECHO2/23/2021 showed EF 66% and GLS -19%.  ECHOpost C#2 AC 3/17/2021showed EF 64% and GLS -19%.  Echo post C4 AC- 4/19/ 21 showed EF 64% GLS -19. %      Several issues with chemotherapy:  Oral thrush- rx diflucan 100 mg daily x 7 days, Refill.   Nausea: continue Ativan as  needed. Not taking zyprexa   Dyspepsia: dexilant not covered by insurance. Has not tried prilosec. Rec'ed again to try.   Bone pain in legs: Likely from Taxol. Continue with Tramadol/Advil as needed. Advil works better.   Progressed fatigue:  Will check B12, folate, iron profile, ferritin and next set of labs.  Epistaxis: recommended humidifier and nasal saline  BRBPR: likely hemorrhoids recommend Preparation H, use of stool softener, avoid dehydration, using wipes   Cough: possibly related to GERD. Monitor and use prilosec as directed. Might also add carafate as well.        Left breast sonogram 07/04/2019 showed reduction in size left breast mass to 1.1 x 0.4 x 0.4 cm.   Rapport Dr. Domenick Bookbinder on 09/14/2019.  Expected completion of chemotherapy 09/15/2019.      RTC 3 weeks with Claudia   RTC 09/26/2019 at 12:40 with me for PE and labs (CBC and CMP).     [name], MD

## 2019-08-15 ENCOUNTER — Ambulatory Visit: Payer: Commercial Managed Care - POS | Attending: Internal Medicine | Admitting: Internal Medicine

## 2019-08-15 ENCOUNTER — Telehealth: Payer: Self-pay | Admitting: Internal Medicine

## 2019-08-15 ENCOUNTER — Encounter: Payer: Self-pay | Admitting: Internal Medicine

## 2019-08-15 VITALS — BP 139/84 | HR 98 | Temp 97.9°F | Resp 18 | Wt 285.6 lb

## 2019-08-15 DIAGNOSIS — Z171 Estrogen receptor negative status [ER-]: Secondary | ICD-10-CM

## 2019-08-15 DIAGNOSIS — C50412 Malignant neoplasm of upper-outer quadrant of left female breast: Secondary | ICD-10-CM

## 2019-08-15 DIAGNOSIS — Z5111 Encounter for antineoplastic chemotherapy: Secondary | ICD-10-CM

## 2019-08-15 DIAGNOSIS — K2100 Gastro-esophageal reflux disease with esophagitis, without bleeding: Secondary | ICD-10-CM

## 2019-08-15 DIAGNOSIS — B370 Candidal stomatitis: Secondary | ICD-10-CM

## 2019-08-15 DIAGNOSIS — D539 Nutritional anemia, unspecified: Secondary | ICD-10-CM

## 2019-08-15 NOTE — Telephone Encounter (Signed)
Continue weekly paclitaxel    RTC in 3 weeks with Claudia  RTC 09/26/2019 with me at 12:20

## 2019-08-15 NOTE — Addendum Note (Signed)
Addended by: Celene Kras on: 08/15/2019 03:36 PM     Modules accepted: Orders

## 2019-08-15 NOTE — Telephone Encounter (Signed)
rx nystatin 100,000 units QID

## 2019-08-16 MED ORDER — NYSTATIN 100000 UNIT/ML MT SUSP
500000.00 [IU] | Freq: Four times a day (QID) | OROMUCOSAL | 2 refills | Status: DC
Start: 2019-08-16 — End: 2019-11-28

## 2019-08-16 NOTE — Addendum Note (Signed)
Addended by: Celene Kras on: 08/16/2019 10:05 AM     Modules accepted: Orders

## 2019-08-16 NOTE — Telephone Encounter (Signed)
VO per Dr. Ladell Heads should be 500,000 units QID.  Rx sent.

## 2019-08-17 ENCOUNTER — Encounter: Payer: Self-pay | Admitting: Physician Assistant

## 2019-08-17 ENCOUNTER — Encounter: Payer: Self-pay | Admitting: Internal Medicine

## 2019-08-17 DIAGNOSIS — K2100 Gastro-esophageal reflux disease with esophagitis, without bleeding: Secondary | ICD-10-CM | POA: Insufficient documentation

## 2019-08-17 DIAGNOSIS — D539 Nutritional anemia, unspecified: Secondary | ICD-10-CM | POA: Insufficient documentation

## 2019-08-18 ENCOUNTER — Ambulatory Visit: Payer: Commercial Managed Care - POS | Attending: Internal Medicine

## 2019-08-18 VITALS — BP 125/81 | HR 103 | Temp 98.5°F | Resp 18 | Wt 285.6 lb

## 2019-08-18 DIAGNOSIS — C50412 Malignant neoplasm of upper-outer quadrant of left female breast: Secondary | ICD-10-CM | POA: Insufficient documentation

## 2019-08-18 DIAGNOSIS — Z5111 Encounter for antineoplastic chemotherapy: Secondary | ICD-10-CM | POA: Insufficient documentation

## 2019-08-18 DIAGNOSIS — Z171 Estrogen receptor negative status [ER-]: Secondary | ICD-10-CM | POA: Insufficient documentation

## 2019-08-18 LAB — CBC AND DIFFERENTIAL
Absolute NRBC: 0 10*3/uL (ref 0.00–0.00)
Basophils Absolute Automated: 0.02 10*3/uL (ref 0.00–0.08)
Basophils Automated: 0.5 %
Eosinophils Absolute Automated: 0.02 10*3/uL (ref 0.00–0.44)
Eosinophils Automated: 0.5 %
Hematocrit: 28.4 % — ABNORMAL LOW (ref 34.7–43.7)
Hgb: 9.7 g/dL — ABNORMAL LOW (ref 11.4–14.8)
Immature Granulocytes Absolute: 0.01 10*3/uL (ref 0.00–0.07)
Immature Granulocytes: 0.2 %
Lymphocytes Absolute Automated: 1.37 10*3/uL (ref 0.42–3.22)
Lymphocytes Automated: 32.2 %
MCH: 32.9 pg (ref 25.1–33.5)
MCHC: 34.2 g/dL (ref 31.5–35.8)
MCV: 96.3 fL — ABNORMAL HIGH (ref 78.0–96.0)
MPV: 10.4 fL (ref 8.9–12.5)
Monocytes Absolute Automated: 0.37 10*3/uL (ref 0.21–0.85)
Monocytes: 8.7 %
Neutrophils Absolute: 2.47 10*3/uL (ref 1.10–6.33)
Neutrophils: 57.9 %
Nucleated RBC: 0 /100 WBC (ref 0.0–0.0)
Platelets: 247 10*3/uL (ref 142–346)
RBC: 2.95 10*6/uL — ABNORMAL LOW (ref 3.90–5.10)
RDW: 15 % (ref 11–15)
WBC: 4.26 10*3/uL (ref 3.10–9.50)

## 2019-08-18 LAB — HEMOLYSIS INDEX
Hemolysis Index: 1 (ref 0–18)
Hemolysis Index: 2 (ref 0–18)

## 2019-08-18 LAB — COMPREHENSIVE METABOLIC PANEL
ALT: 36 U/L (ref 0–55)
AST (SGOT): 26 U/L (ref 5–34)
Albumin/Globulin Ratio: 1.3 (ref 0.9–2.2)
Albumin: 3.8 g/dL (ref 3.5–5.0)
Alkaline Phosphatase: 82 U/L (ref 37–106)
Anion Gap: 13 (ref 5.0–15.0)
BUN: 10 mg/dL (ref 7.0–19.0)
Bilirubin, Total: 0.3 mg/dL (ref 0.2–1.2)
CO2: 23 mEq/L (ref 21–29)
Calcium: 9.1 mg/dL (ref 8.5–10.5)
Chloride: 102 mEq/L (ref 100–111)
Creatinine: 1.1 mg/dL (ref 0.4–1.5)
Globulin: 3 g/dL (ref 2.0–3.7)
Glucose: 105 mg/dL — ABNORMAL HIGH (ref 70–100)
Potassium: 3.3 mEq/L — ABNORMAL LOW (ref 3.5–5.1)
Protein, Total: 6.8 g/dL (ref 6.0–8.3)
Sodium: 138 mEq/L (ref 136–145)

## 2019-08-18 LAB — VITAMIN B12: Vitamin B-12: 272 pg/mL (ref 211–911)

## 2019-08-18 LAB — IRON PROFILE
Iron Saturation: 12 % — ABNORMAL LOW (ref 15–50)
Iron: 35 ug/dL — ABNORMAL LOW (ref 40–145)
TIBC: 298 ug/dL (ref 265–497)
UIBC: 263 ug/dL (ref 126–382)

## 2019-08-18 LAB — FOLATE: Folate: 5.1 ng/mL

## 2019-08-18 LAB — GFR: EGFR: >60

## 2019-08-18 MED ORDER — HEPARIN SOD (PORK) LOCK FLUSH 100 UNIT/ML IV SOLN
5.00 mL | INTRAVENOUS | Status: DC | PRN
Start: 2019-08-18 — End: 2019-08-18
  Administered 2019-08-18: 13:00:00 5 mL
  Filled 2019-08-18: qty 5

## 2019-08-18 MED ORDER — FAMOTIDINE 10 MG/ML IV SOLN (WRAP)
20.00 mg | Freq: Once | INTRAVENOUS | Status: AC
Start: 2019-08-18 — End: 2019-08-18
  Administered 2019-08-18: 11:00:00 20 mg via INTRAVENOUS
  Filled 2019-08-18: qty 2

## 2019-08-18 MED ORDER — SODIUM CHLORIDE 0.9 % IV EXCEL
80.00 mg/m2 | Freq: Once | INTRAVENOUS | Status: AC
Start: 2019-08-18 — End: 2019-08-18
  Administered 2019-08-18: 12:00:00 210 mg via INTRAVENOUS
  Filled 2019-08-18: qty 35

## 2019-08-18 MED ORDER — ONDANSETRON IVPB 16 MG IN NS 50 ML
16.00 mg | Freq: Once | INTRAVENOUS | Status: AC
Start: 2019-08-18 — End: 2019-08-18
  Administered 2019-08-18: 11:00:00 16 mg via INTRAVENOUS
  Filled 2019-08-18: qty 50

## 2019-08-18 NOTE — Progress Notes (Signed)
[name]   Visit Date: 08/18/2019    D8C3 Taxol   *cryotherapy with taxol     Leslie [name] is a 49 y.o. female patient of [name], MD    Since her last visit, she has been doing well.    Overall, she states her energy level is good.  The patient is able to perform most usual functions.. Her appetite is reported to be intact, with no significant changes in weight.. She reports fatigue and neuropathy, continues with cryotherapy. She has been having indigestion - Dr. Ladell Heads aware and pt picked up Prilosec prescription but hasnt used it yet. Reports she will use it today. She Denies N/V/D/C/SOB or chest pain. She has no other concerns. There are no social work or other referral needs at this time.    Diagnosis: Breast Cancer  Treatment on Clinical Trial: Not On Study (NOS)  [facility](O) - OP Adult - Breast Neoadjuvant - PACLitaxel + CARBOplatin (D1,8,15) Q 21 Days X 4 Cycles (Taxol only from C2D8))  [facility] - OP Adult - Normal Saline Hydration (1000 ml/hr)  Pre-Medications: famotidine (Pepcid) and ondansetron (Zofran) *pt takes antihistamine prior to infusion    Line Type: Mediport,    Right Port  Blood Return verified prior to administration: Yes    Vital Signs:    Patient Vitals for the past 12 hrs:   BP Temp Pulse Resp   08/18/19 1114 125/81 98.5 F (36.9 C) (!) 103 18      Body surface area is 2.51 meters squared.    '  Chemistry        Component Value Date/Time    NA 139 08/11/2019 1143    NA 138 08/04/2019 1306    K 3.5 08/11/2019 1143    K 3.7 08/04/2019 1306    CL 103 08/11/2019 1143    CL 101 08/04/2019 1306    CO2 26 08/11/2019 1143    CO2 33 (H) 08/04/2019 1306    BUN 10.0 08/11/2019 1143    BUN 13.0 08/04/2019 1306    CREAT 1.0 08/11/2019 1143    CREAT 1.4 08/04/2019 1306    GLU 113 (H) 08/11/2019 1143    GLU 113 (H) 08/04/2019 1306        Component Value Date/Time    CA 9.0 08/11/2019 1143    CA 9.3 08/04/2019 1306    ALKPHOS 78 08/11/2019 1143    AST 24 08/11/2019 1143    AST 39  (H) 08/04/2019 1306    ALT 33 08/11/2019 1143    ALT 44 08/04/2019 1306    BILITOTAL 0.3 08/11/2019 1143            Lab Results   Component Value Date    WBC 4.26 08/18/2019    HGB 9.7 (L) 08/18/2019    PLT 247 08/18/2019    NEUTROABS 2.47 08/18/2019           Chemo Given within the past 12 hours:   "Chemo/Biotherapy Given" (last 12 hours)     Date/Time Action User Medication Dose Rate Dose Volume (mL) Rate    08/18/19 1241 Stopped    AD PACLitaxel (TAXOL) 210 mg in sodium chloride 0.9 % 320 mL chemo infusion  0 mL/hr       08/18/19 1240 Given    AD heparin 100 UNIT/ML flush 5 mL 5 mL        08/18/19 1141 New Bag    AD PACLitaxel (TAXOL) 210 mg in sodium  chloride 0.9 % 320 mL chemo infusion 210 mg 320 mL/hr               Hypersensitivity Reaction Noted: No  IV Post Hydration: No, not required  Treatment Outcome:Patient tolerated treatment well. Central Line flushed and deacessed.    Education: Patient/Family educated regarding the expected outcomes/side effects possible interactions of medications. Patient verbalizes understanding.    Follow-up Plan: Discharged in stable condition. Accompanied by, friend. Instructed to contact physician if any complications or unmanageable symptoms occur.    A return to the Infusion Center for the next treatment, has been scheduled.      Alicia Amel, RN    08/18/2019  12:43 PM

## 2019-08-25 ENCOUNTER — Encounter: Payer: Self-pay | Admitting: Internal Medicine

## 2019-08-25 ENCOUNTER — Ambulatory Visit: Payer: Commercial Managed Care - POS | Attending: Internal Medicine

## 2019-08-25 ENCOUNTER — Ambulatory Visit: Payer: Commercial Managed Care - POS

## 2019-08-25 VITALS — BP 123/81 | HR 101 | Temp 98.6°F | Resp 18 | Wt 286.0 lb

## 2019-08-25 DIAGNOSIS — C50412 Malignant neoplasm of upper-outer quadrant of left female breast: Secondary | ICD-10-CM | POA: Insufficient documentation

## 2019-08-25 DIAGNOSIS — Z171 Estrogen receptor negative status [ER-]: Secondary | ICD-10-CM | POA: Insufficient documentation

## 2019-08-25 DIAGNOSIS — Z5111 Encounter for antineoplastic chemotherapy: Secondary | ICD-10-CM | POA: Insufficient documentation

## 2019-08-25 LAB — CBC AND DIFFERENTIAL
Absolute NRBC: 0 10*3/uL (ref 0.00–0.00)
Basophils Absolute Automated: 0.03 10*3/uL (ref 0.00–0.08)
Basophils Automated: 0.6 %
Eosinophils Absolute Automated: 0.08 10*3/uL (ref 0.00–0.44)
Eosinophils Automated: 1.6 %
Hematocrit: 28.1 % — ABNORMAL LOW (ref 34.7–43.7)
Hgb: 9.7 g/dL — ABNORMAL LOW (ref 11.4–14.8)
Immature Granulocytes Absolute: 0.02 10*3/uL (ref 0.00–0.07)
Immature Granulocytes: 0.4 %
Lymphocytes Absolute Automated: 1.49 10*3/uL (ref 0.42–3.22)
Lymphocytes Automated: 30 %
MCH: 33.3 pg (ref 25.1–33.5)
MCHC: 34.5 g/dL (ref 31.5–35.8)
MCV: 96.6 fL — ABNORMAL HIGH (ref 78.0–96.0)
MPV: 10.6 fL (ref 8.9–12.5)
Monocytes Absolute Automated: 0.43 10*3/uL (ref 0.21–0.85)
Monocytes: 8.7 %
Neutrophils Absolute: 2.91 10*3/uL (ref 1.10–6.33)
Neutrophils: 58.7 %
Nucleated RBC: 0 /100 WBC (ref 0.0–0.0)
Platelets: 238 10*3/uL (ref 142–346)
RBC: 2.91 10*6/uL — ABNORMAL LOW (ref 3.90–5.10)
RDW: 15 % (ref 11–15)
WBC: 4.96 10*3/uL (ref 3.10–9.50)

## 2019-08-25 LAB — RRL - CMP
ALT Piccolo(R): 30 U/L (ref 10–47)
AST Piccolo(R): 31 U/L (ref 11–38)
Albumin Piccolo(R): 3.6 g/dL (ref 3.3–5.0)
Alkaline Phosphatase Piccolo(R): 65 U/L (ref 42–141)
BUN Piccolo(R): 11 mg/dL (ref 7.0–19.0)
Bilirubin Total Piccolo(R): 0.5 mg/dL (ref 0.2–1.6)
CO2 Piccolo(R): 32 mEq/L — ABNORMAL HIGH (ref 21–29)
Calcium Piccolo(R): 9.6 mg/dL (ref 8.5–10.5)
Chloride Piccolo(R): 101 mEq/L (ref 98–107)
Creatinine Piccolo(R): 1.2 mg/dL (ref 0.4–1.5)
Glucose Piccolo(R): 110 mg/dL — ABNORMAL HIGH (ref 70–100)
Potassium Piccolo(R): 3.4 mEq/L — ABNORMAL LOW (ref 3.5–5.1)
Protein Piccolo(R): 7 g/dL (ref 6.4–8.1)
Sodium Piccolo(R): 136 mEq/L (ref 136–145)

## 2019-08-25 LAB — GFR: GFR Piccolo(R): 47.8

## 2019-08-25 MED ORDER — SODIUM CHLORIDE (PF) 0.9 % IJ SOLN
5.00 mL | INTRAMUSCULAR | Status: DC | PRN
Start: 2019-08-25 — End: 2019-08-25
  Administered 2019-08-25: 14:00:00 10 mL via INTRAVENOUS

## 2019-08-25 MED ORDER — SODIUM CHLORIDE 0.9 % IV EXCEL
80.00 mg/m2 | Freq: Once | INTRAVENOUS | Status: AC
Start: 2019-08-25 — End: 2019-08-25
  Administered 2019-08-25: 13:00:00 210 mg via INTRAVENOUS
  Filled 2019-08-25: qty 35

## 2019-08-25 MED ORDER — FAMOTIDINE 10 MG/ML IV SOLN (WRAP)
20.00 mg | Freq: Once | INTRAVENOUS | Status: AC
Start: 2019-08-25 — End: 2019-08-25
  Administered 2019-08-25: 12:00:00 20 mg via INTRAVENOUS
  Filled 2019-08-25: qty 2

## 2019-08-25 MED ORDER — SODIUM CHLORIDE 0.9 % IV SOLN
250.00 mL | INTRAVENOUS | Status: DC
Start: 2019-08-25 — End: 2019-08-25
  Administered 2019-08-25: 12:00:00 250 mL via INTRAVENOUS

## 2019-08-25 MED ORDER — HEPARIN SOD (PORK) LOCK FLUSH 100 UNIT/ML IV SOLN
5.00 mL | INTRAVENOUS | Status: DC | PRN
Start: 2019-08-25 — End: 2019-08-25
  Administered 2019-08-25: 14:00:00 5 mL
  Filled 2019-08-25: qty 5

## 2019-08-25 MED ORDER — ONDANSETRON IVPB 16 MG IN NS 50 ML
16.00 mg | Freq: Once | INTRAVENOUS | Status: AC
Start: 2019-08-25 — End: 2019-08-25
  Administered 2019-08-25: 12:00:00 16 mg via INTRAVENOUS
  Filled 2019-08-25: qty 50

## 2019-08-25 NOTE — Progress Notes (Signed)
[facility]   Visit Date: 08/25/2019        Leslie [name] is a 49 y.o. female patient of [name], MD    Since her last visit, she has been doing well.   Patient presents to the infusion center for Cycle 3, Day 15 Taxol.   Overall, she states her energy level is good.  The patient is able to perform most usual functions.. Her appetite is reported to be intact, with no significant changes in weight.. She reports fatigue and neuropathy in fingers and on the tops of both thighs.  Pt reports it feels like "nerve pain" on top of legs.  Pt states she is eating OK but feels she gets full quickly.  Pt continues to have a cough but it is improving, feels like it is getting better with antacid.  Pt denies n/v/SOB/chest pain/fever.  Labs drawn, CBC and CMP reviewed.  Criteria to treat met, copy of results given to pt.     Diagnosis: Breast Cancer  Treatment on Clinical Trial: Not On Study (NOS)  [facility]) - OP Adult - Breast Neoadjuvant - PACLitaxel + CARBOplatin (D1,8,15) Q 21 Days X 4 Cycles (Taxol only from C2D8))  [facility] - OP Adult - Normal Saline Hydration (1000 ml/hr)      IV Pre Hydration: N/A  Urine Parameters: N/A  Pre-Medications: famotidine (Pepcid) and ondansetron (Zofran)- pt took Benadryl prior to arrival    Line Type: Mediport,    Right Port accessed per protocol  Blood Return verified prior to administration: Yes    Vital Signs:    Patient Vitals for the past 12 hrs:   BP Temp Pulse Resp   08/25/19 1131 123/81 98.6 F (37 C) (!) 101 18      Body surface area is 2.51 meters squared.    '  Chemistry        Component Value Date/Time    NA 136 08/25/2019 1136    K 3.4 (L) 08/25/2019 1136    CL 101 08/25/2019 1136    CO2 32 (H) 08/25/2019 1136    BUN 11.0 08/25/2019 1136    CREAT 1.2 08/25/2019 1136    GLU 110 (H) 08/25/2019 1136        Component Value Date/Time    CA 9.6 08/25/2019 1136    ALKPHOS 82 08/18/2019 1057    AST 31 08/25/2019 1136    ALT 30 08/25/2019 1136    BILITOTAL 0.3  08/18/2019 1057            Lab Results   Component Value Date    WBC 4.96 08/25/2019    HGB 9.7 (L) 08/25/2019    PLT 238 08/25/2019    NEUTROABS 2.91 08/25/2019       Administrations This Visit     0.9% NaCl infusion     Admin Date  08/25/2019  12:21 Action  New Bag Dose  250 mL Rate  20 mL/hr Route  Intravenous Ordering Provider  [name], MD          famotidine (PEPCID) injection 20 mg     Admin Date  08/25/2019  12:22 Action  Given Dose  20 mg Route  Intravenous Ordering Provider  [name], MD          ondansetron [facility]) 16 mg in sodium chloride 0.9 % 50 mL IVPB (premix)     Admin Date  08/25/2019  12:24 Action  New Bag Dose  16 mg Rate  200  mL/hr Route  Intravenous Ordering Provider  [name], MD          PACLitaxel (TAXOL) 210 mg in sodium chloride 0.9 % 320 mL chemo infusion     Admin Date  08/25/2019  12:57 Action  New Bag Dose  210 mg Rate  320 mL/hr Route  Intravenous Ordering Provider  [name], MD                Chemo Given within the past 12 hours:   "Chemo/Biotherapy Given" (last 12 hours)     Date/Time Action User Medication Dose Rate Dose Volume (mL) Rate    08/25/19 1402 Given    AK heparin 100 UNIT/ML flush 5 mL 5 mL        08/25/19 1357 Stopped    AK PACLitaxel (TAXOL) 210 mg in sodium chloride 0.9 % 320 mL chemo infusion  0 mL/hr       08/25/19 1257 New Bag    AK PACLitaxel (TAXOL) 210 mg in sodium chloride 0.9 % 320 mL chemo infusion 210 mg 320 mL/hr               Hypersensitivity Reaction Noted: No  IV Post Hydration: No, not required  Treatment Outcome:Patient tolerated treatment well. Pt using cryotherapy on hands and feet during Taxol.  Flushed port with NS and heparin and de-accessed per protocol.  Site without redness or inflammation, band-aid applied.     Education: Patient/Family educated regarding the expected outcomes/side effects possible interactions of treatments. Patient verbalizes understanding.    Follow-up Plan: Discharged in stable condition.  Accompanied by, daughter. Instructed to contact physician if any complications or unmanageable symptoms occur.    A return to the Infusion Center for the next treatment, has been scheduled.  RTC 09/01/2019.      [name], RN    08/25/2019  2:48 PM

## 2019-08-30 ENCOUNTER — Ambulatory Visit (HOSPITAL_BASED_OUTPATIENT_CLINIC_OR_DEPARTMENT_OTHER): Payer: Commercial Managed Care - POS | Admitting: Surgery

## 2019-08-30 ENCOUNTER — Encounter: Payer: Self-pay | Admitting: Internal Medicine

## 2019-08-30 ENCOUNTER — Telehealth: Payer: Self-pay | Admitting: Internal Medicine

## 2019-08-30 NOTE — Telephone Encounter (Signed)
Faxed completed [employer] Term Disability form to [phone].  Form sent to scan to chart.

## 2019-08-31 ENCOUNTER — Other Ambulatory Visit: Payer: Self-pay | Admitting: Internal Medicine

## 2019-08-31 DIAGNOSIS — Z171 Estrogen receptor negative status [ER-]: Secondary | ICD-10-CM

## 2019-08-31 DIAGNOSIS — Z5111 Encounter for antineoplastic chemotherapy: Secondary | ICD-10-CM

## 2019-09-01 ENCOUNTER — Ambulatory Visit: Payer: Commercial Managed Care - POS | Attending: Internal Medicine

## 2019-09-01 VITALS — BP 119/71 | HR 89 | Temp 98.6°F | Resp 18 | Wt 279.9 lb

## 2019-09-01 DIAGNOSIS — M898X8 Other specified disorders of bone, other site: Secondary | ICD-10-CM | POA: Insufficient documentation

## 2019-09-01 DIAGNOSIS — Z5111 Encounter for antineoplastic chemotherapy: Secondary | ICD-10-CM

## 2019-09-01 DIAGNOSIS — E876 Hypokalemia: Secondary | ICD-10-CM

## 2019-09-01 DIAGNOSIS — C50412 Malignant neoplasm of upper-outer quadrant of left female breast: Secondary | ICD-10-CM | POA: Insufficient documentation

## 2019-09-01 DIAGNOSIS — Z171 Estrogen receptor negative status [ER-]: Secondary | ICD-10-CM | POA: Insufficient documentation

## 2019-09-01 LAB — CBC AND DIFFERENTIAL
Absolute NRBC: 0 10*3/uL (ref 0.00–0.00)
Basophils Absolute Automated: 0.04 10*3/uL (ref 0.00–0.08)
Basophils Automated: 0.6 %
Eosinophils Absolute Automated: 0.08 10*3/uL (ref 0.00–0.44)
Eosinophils Automated: 1.3 %
Hematocrit: 28.9 % — ABNORMAL LOW (ref 34.7–43.7)
Hgb: 9.7 g/dL — ABNORMAL LOW (ref 11.4–14.8)
Immature Granulocytes Absolute: 0.03 10*3/uL (ref 0.00–0.07)
Immature Granulocytes: 0.5 %
Lymphocytes Absolute Automated: 1.93 10*3/uL (ref 0.42–3.22)
Lymphocytes Automated: 31.2 %
MCH: 32 pg (ref 25.1–33.5)
MCHC: 33.6 g/dL (ref 31.5–35.8)
MCV: 95.4 fL (ref 78.0–96.0)
MPV: 10.9 fL (ref 8.9–12.5)
Monocytes Absolute Automated: 0.42 10*3/uL (ref 0.21–0.85)
Monocytes: 6.8 %
Neutrophils Absolute: 3.69 10*3/uL (ref 1.10–6.33)
Neutrophils: 59.6 %
Nucleated RBC: 0 /100 WBC (ref 0.0–0.0)
Platelets: 227 10*3/uL (ref 142–346)
RBC: 3.03 10*6/uL — ABNORMAL LOW (ref 3.90–5.10)
RDW: 15 % (ref 11–15)
WBC: 6.19 10*3/uL (ref 3.10–9.50)

## 2019-09-01 LAB — RRL - CMP
ALT Piccolo(R): 34 U/L (ref 10–47)
AST Piccolo(R): 40 U/L — ABNORMAL HIGH (ref 11–38)
Albumin Piccolo(R): 3.5 g/dL (ref 3.3–5.0)
Alkaline Phosphatase Piccolo(R): 66 U/L (ref 42–141)
BUN Piccolo(R): 10 mg/dL (ref 7.0–19.0)
Bilirubin Total Piccolo(R): 0.5 mg/dL (ref 0.2–1.6)
CO2 Piccolo(R): 32 mEq/L — ABNORMAL HIGH (ref 21–29)
Calcium Piccolo(R): 9 mg/dL (ref 8.5–10.5)
Chloride Piccolo(R): 99 mEq/L (ref 98–107)
Creatinine Piccolo(R): 1.2 mg/dL (ref 0.4–1.5)
Glucose Piccolo(R): 145 mg/dL — ABNORMAL HIGH (ref 70–100)
Potassium Piccolo(R): 2.9 mEq/L — ABNORMAL LOW (ref 3.5–5.1)
Protein Piccolo(R): 6.9 g/dL (ref 6.4–8.1)
Sodium Piccolo(R): 134 mEq/L — ABNORMAL LOW (ref 136–145)

## 2019-09-01 LAB — RRL - MAGNESIUM: Magnesium Piccolo(R): 1.7 mg/dL (ref 1.6–2.6)

## 2019-09-01 LAB — GFR: GFR Piccolo(R): 47.8

## 2019-09-01 MED ORDER — ONDANSETRON IVPB 16 MG IN NS 50 ML
16.00 mg | Freq: Once | INTRAVENOUS | Status: AC
Start: 2019-09-01 — End: 2019-09-01
  Administered 2019-09-01: 12:00:00 16 mg via INTRAVENOUS
  Filled 2019-09-01: qty 50

## 2019-09-01 MED ORDER — SODIUM CHLORIDE 0.9 % IV SOLN
250.00 mL | INTRAVENOUS | Status: DC
Start: 2019-09-01 — End: 2019-09-01
  Administered 2019-09-01: 12:00:00 250 mL via INTRAVENOUS

## 2019-09-01 MED ORDER — FAMOTIDINE 10 MG/ML IV SOLN (WRAP)
20.00 mg | Freq: Once | INTRAVENOUS | Status: AC
Start: 2019-09-01 — End: 2019-09-01
  Administered 2019-09-01: 12:00:00 20 mg via INTRAVENOUS
  Filled 2019-09-01: qty 2

## 2019-09-01 MED ORDER — POTASSIUM CHLORIDE CRYS ER 20 MEQ PO TBCR
40.00 meq | EXTENDED_RELEASE_TABLET | Freq: Two times a day (BID) | ORAL | 0 refills | Status: DC
Start: 2019-09-01 — End: 2019-09-01

## 2019-09-01 MED ORDER — PACLITAXEL 6 MG/ML IV CONC (WRAP)
80.00 mg/m2 | Freq: Once | INTRAVENOUS | Status: AC
Start: 2019-09-01 — End: 2019-09-01
  Administered 2019-09-01: 13:00:00 210 mg via INTRAVENOUS
  Filled 2019-09-01: qty 35

## 2019-09-01 MED ORDER — POTASSIUM CHLORIDE 10 MEQ/100ML IV SOLN
10.00 meq | Freq: Once | INTRAVENOUS | Status: AC
Start: 2019-09-01 — End: 2019-09-01
  Administered 2019-09-01: 13:00:00 10 meq via INTRAVENOUS
  Filled 2019-09-01: qty 100

## 2019-09-01 MED ORDER — SODIUM CHLORIDE (PF) 0.9 % IJ SOLN
5.00 mL | INTRAMUSCULAR | Status: DC | PRN
Start: 2019-09-01 — End: 2019-09-01
  Administered 2019-09-01: 15:00:00 10 mL via INTRAVENOUS

## 2019-09-01 MED ORDER — POTASSIUM CHLORIDE 10 MEQ/100ML IV SOLN
10.00 meq | Freq: Once | INTRAVENOUS | Status: AC
Start: 2019-09-01 — End: 2019-09-01
  Administered 2019-09-01: 14:00:00 10 meq via INTRAVENOUS
  Filled 2019-09-01: qty 100

## 2019-09-01 MED ORDER — POTASSIUM CHLORIDE CRYS ER 20 MEQ PO TBCR
40.00 meq | EXTENDED_RELEASE_TABLET | Freq: Two times a day (BID) | ORAL | 0 refills | Status: DC
Start: 2019-09-01 — End: 2019-09-09

## 2019-09-01 MED ORDER — HEPARIN SOD (PORK) LOCK FLUSH 100 UNIT/ML IV SOLN
5.00 mL | INTRAVENOUS | Status: DC | PRN
Start: 2019-09-01 — End: 2019-09-01
  Administered 2019-09-01: 15:00:00 5 mL
  Filled 2019-09-01: qty 5

## 2019-09-01 NOTE — Progress Notes (Signed)
PROGRESS NOTE        Brief Note     Patient Name: Leslie [name], Leslie [name]      CC: right arm pain last week    [name] with breast cancer presents to the infusion clinic for Cycle 4, Day 1 Taxol (Carboplatin d/c after C2)    [facility]) - OP Adult - Breast Neoadjuvant - PACLitaxel + CARBOplatin (D1,8,15) Q 21 Days X 4 Cycles     Oncologist: A. Ladell Heads, MD    Ms. Tabia LILLER YOHN is  well-appearing and not in distress.  She reports experiencing  pain in her posterior upper arm last week. 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 She has been on Eliquis since then.   Today, she does not have pain. She denies swelling, redness in the right am or elsewhere.   - reports cough is much improved with Prilosec  - fatigue is getting slightly better  -BRBPR is improving - she now takes stool softeners  -Bone pain - significantly improved with CBD oil    Patient denies cough, fever, chills, palpitations, GI/GU symptoms, change in lower extremity swelling (patient with history of LE lymphedema).   She offers no other concerns or complaints.      Assessment / Plan:      1. Breast cancer  - Continue with the treatment as planned   -f/u with Dr. Ladell Heads on 6/1    2. Hypokalemia (K+ 2.9)  - infuse 20 mEq KCL IV over 2 hours  - take 40 mEq KCL PO BID for 3 days    3. Right upper arm pain, resolved  4. Bone pain  - likely taxol induced  - Continue with current management including CBD oil      As always, patient was instructed to seek medical care with temperature 100.4 or higher and/or any other concerning S&S. Patient verbalized understanding.    Jovita Gamma, NP

## 2019-09-01 NOTE — Progress Notes (Signed)
[place] [facility]   Visit Date: 09/01/2019        Leslie [name] is a 49 y.o. female patient of [name], MD    Since her last visit, she has been doing fair.   Patient presents to the infusion center for Cycle 4, Day 1 Taxol.   Overall, she states her energy level is good.  The patient is able to perform most usual functions.. Her decreased appetite with an associated weight loss of 7 lbs.. She reports some constipation but is using stool softeners.  Pt also c/o fatigue, continued neuropathy and numbness on the tops of both thighs and in her feet.  Pt reports this is very bothersome.  Pt reports feeling full quickly but is eating and drinking.  Pt reports R arm pain for the past week in the back of her arm and shoulder, pt reported feeling like she "slept on it funny."  Pain is resolved now, pt denies swelling in R arm, neck or around mediport.  Pt denies n/v/SOB/chest pain/fever.  Labs drawn, CBC and CMP reviewed.  Criteria to treat met, copy of results given to pt.  K 2.9, notified [name], NP.  20 meq IV K ordered for today as well as PO K supplement sent to pt's pharmacy.    [name], NP assessed pt's arm, since discomfort is no longer present will monitor. *    *20 meq IV K infused over 2 hrs, pt tol well.*    Diagnosis: Breast Cancer  Treatment on Clinical Trial: Not On Study (NOS)  [facility](O) - OP Adult - Breast Neoadjuvant - PACLitaxel + CARBOplatin (D1,8,15) Q 21 Days X 4 Cycles (Taxol only from C2D8))  [facility] - OP Adult - Normal Saline Hydration (1000 ml/hr)      IV Pre Hydration: N/A  Urine Parameters: N/A  Pre-Medications: famotidine (Pepcid) and ondansetron (Zofran)- pt took Benadryl prior to arrival    Line Type: Mediport,    Right Port accessed per protocol  Blood Return verified prior to administration: Yes    Vital Signs:    Patient Vitals for the past 12 hrs:   BP Temp Pulse Resp   09/01/19 1204 119/71 -- 89 --   09/01/19 1110 99/64 98.6 F (37 C) (!) 109 18      Body surface area is  2.49 meters squared.    '  Chemistry        Component Value Date/Time    NA 134 (L) 09/01/2019 1147    K 2.9 (L) 09/01/2019 1147    CL 99 09/01/2019 1147    CO2 32 (H) 09/01/2019 1147    BUN 10.0 09/01/2019 1147    CREAT 1.2 09/01/2019 1147    GLU 145 (H) 09/01/2019 1147        Component Value Date/Time    CA 9.0 09/01/2019 1147    ALKPHOS 82 08/18/2019 1057    AST 40 (H) 09/01/2019 1147    ALT 34 09/01/2019 1147    BILITOTAL 0.3 08/18/2019 1057            Lab Results   Component Value Date    WBC 6.19 09/01/2019    HGB 9.7 (L) 09/01/2019    PLT 227 09/01/2019    NEUTROABS 3.69 09/01/2019       Administrations This Visit     0.9% NaCl infusion     Admin Date  09/01/2019  12:10 Action  New Bag Dose  250 mL Rate  20 mL/hr  Route  Intravenous Ordering Provider  [name], MD          famotidine (PEPCID) injection 20 mg     Admin Date  09/01/2019  12:11 Action  Given Dose  20 mg Route  Intravenous Ordering Provider  [name], MD          heparin 100 UNIT/ML flush 5 mL     Admin Date  09/01/2019  15:01 Action  Given Dose  5 mL Route  Intracatheter Ordering Provider  [name], MD          ondansetron [facility]) 16 mg in sodium chloride 0.9 % 50 mL IVPB (premix)     Admin Date  09/01/2019  12:14 Action  New Bag Dose  16 mg Rate  200 mL/hr Route  Intravenous Ordering Provider  [name], MD          PACLitaxel (TAXOL) 210 mg in sodium chloride 0.9 % 320 mL chemo infusion     Admin Date  09/01/2019  12:51 Action  New Bag Dose  210 mg Rate  320 mL/hr Route  Intravenous Ordering Provider  [name], MD          potassium chloride 10 mEq in 100 mL IVPB (premix)     Admin Date  09/01/2019  12:51 Action  New Bag Dose  10 mEq Rate  100 mL/hr Route  Intravenous Ordering Provider  Jovita Gamma, DNP FNP           Admin Date  09/01/2019  13:52 Action  New Bag Dose  10 mEq Rate  100 mL/hr Route  Intravenous Ordering Provider  Jovita Gamma, DNP FNP          sodium chloride (PF) 0.9 % injection 5-20  mL     Admin Date  09/01/2019  15:01 Action  Given Dose  10 mL Route  Intravenous Ordering Provider  [name], MD                Chemo Given within the past 12 hours:   "Chemo/Biotherapy Given" (last 12 hours)     Date/Time Action User Medication Dose Rate Dose Volume (mL) Rate    09/01/19 1501 Given    AK heparin 100 UNIT/ML flush 5 mL 5 mL        09/01/19 1351 Stopped    AK PACLitaxel (TAXOL) 210 mg in sodium chloride 0.9 % 320 mL chemo infusion  0 mL/hr       09/01/19 1251 New Bag    AK PACLitaxel (TAXOL) 210 mg in sodium chloride 0.9 % 320 mL chemo infusion 210 mg 320 mL/hr               Hypersensitivity Reaction Noted: No  IV Post Hydration: No, not required  Treatment Outcome:Patient tolerated treatment well. Pt using cryotherapy on hands and feet during Taxol.  Flushed port with NS and heparin and de-accessed per protocol.  Site without redness or inflammation, band-aid applied.      Education: Patient/Family educated regarding the expected outcomes/side effects possible interactions of treatments. Patient verbalizes understanding.    Follow-up Plan: Discharged in stable condition. Accompanied by, daughter. Instructed to contact physician if any complications or unmanageable symptoms occur.    A return to the Infusion Center for the next treatment, has been scheduled.  RTC 09/08/2019.      [name], RN    09/01/2019  4:04 PM

## 2019-09-06 ENCOUNTER — Encounter: Payer: Self-pay | Admitting: Physician Assistant

## 2019-09-06 ENCOUNTER — Ambulatory Visit: Payer: Commercial Managed Care - POS | Attending: Physician Assistant | Admitting: Physician Assistant

## 2019-09-06 ENCOUNTER — Encounter: Payer: Self-pay | Admitting: Internal Medicine

## 2019-09-06 VITALS — BP 120/83 | HR 105 | Temp 97.7°F | Wt 286.0 lb

## 2019-09-06 DIAGNOSIS — M542 Cervicalgia: Secondary | ICD-10-CM

## 2019-09-06 DIAGNOSIS — Z171 Estrogen receptor negative status [ER-]: Secondary | ICD-10-CM

## 2019-09-06 DIAGNOSIS — T451X5A Adverse effect of antineoplastic and immunosuppressive drugs, initial encounter: Secondary | ICD-10-CM

## 2019-09-06 DIAGNOSIS — C50412 Malignant neoplasm of upper-outer quadrant of left female breast: Secondary | ICD-10-CM

## 2019-09-06 DIAGNOSIS — G620 Drug-induced polyneuropathy: Secondary | ICD-10-CM

## 2019-09-06 MED ORDER — GABAPENTIN 100 MG PO CAPS
300.00 mg | ORAL_CAPSULE | Freq: Three times a day (TID) | ORAL | 1 refills | Status: DC
Start: 2019-09-06 — End: 2019-11-28

## 2019-09-07 ENCOUNTER — Encounter: Payer: Self-pay | Admitting: Internal Medicine

## 2019-09-07 ENCOUNTER — Telehealth: Payer: Self-pay | Admitting: Internal Medicine

## 2019-09-07 ENCOUNTER — Encounter: Payer: Self-pay | Admitting: Physician Assistant

## 2019-09-07 ENCOUNTER — Telehealth: Payer: Self-pay | Admitting: Physician Assistant

## 2019-09-07 ENCOUNTER — Ambulatory Visit
Admission: RE | Admit: 2019-09-07 | Discharge: 2019-09-07 | Disposition: A | Discharge status: Home / Self Care | Payer: Commercial Managed Care - POS | Source: Ambulatory Visit | Attending: Physician Assistant | Admitting: Physician Assistant

## 2019-09-07 DIAGNOSIS — M542 Cervicalgia: Secondary | ICD-10-CM | POA: Insufficient documentation

## 2019-09-07 DIAGNOSIS — Z95828 Presence of other vascular implants and grafts: Secondary | ICD-10-CM | POA: Insufficient documentation

## 2019-09-07 DIAGNOSIS — Z452 Encounter for adjustment and management of vascular access device: Secondary | ICD-10-CM | POA: Insufficient documentation

## 2019-09-07 DIAGNOSIS — Z79899 Other long term (current) drug therapy: Secondary | ICD-10-CM | POA: Insufficient documentation

## 2019-09-07 DIAGNOSIS — Z7901 Long term (current) use of anticoagulants: Secondary | ICD-10-CM | POA: Insufficient documentation

## 2019-09-07 DIAGNOSIS — Z171 Estrogen receptor negative status [ER-]: Secondary | ICD-10-CM | POA: Insufficient documentation

## 2019-09-07 DIAGNOSIS — E876 Hypokalemia: Secondary | ICD-10-CM

## 2019-09-07 DIAGNOSIS — C50412 Malignant neoplasm of upper-outer quadrant of left female breast: Secondary | ICD-10-CM | POA: Insufficient documentation

## 2019-09-07 DIAGNOSIS — I82C21 Chronic embolism and thrombosis of right internal jugular vein: Secondary | ICD-10-CM | POA: Insufficient documentation

## 2019-09-07 NOTE — Telephone Encounter (Signed)
Claudia notified in secure chat.

## 2019-09-07 NOTE — Progress Notes (Signed)
[facility]  [address] [name]  [phone]      [name] Office  [phone]  Visit Date: 09/07/2019    Reason for visit:  Breast cancer, chemotherapy treatment follow up       HISTORY OF PRESENT ILLNESS:  Ms. Leslie [name] is a undergoing neoadjuvant chemotherapy for breast cancer treatment now on weekly Taxol without Carbo.   Last treated on 7/15/ 21 present accompanied by her mother for treatment follow up.     Reports progressive fatigue and ongoing joint and muscle pain.   Takes tramasol and Advil as needed.   Having more tingling and discomfort in tips of finger and toes. Hard to open bottles and just painful.   Oral thrush symptoms improved since last visit.   Nausea more manageable.   Still gets heartburn and not taking heartburn consistently.       Having new pain over right lateral neck at port site.   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   Denies any lapse in Eliquis treatment.         Oncology History Overview Note   Physician Requesting Consult: de Lawson Radar, Dairl Ponder, MD    Breast Surgeon: Orlene Och, MD  Radiation Oncologist:  Plastic Surgeon:   PCP: Satira Mccallum, MD      Leslie [name] is a 49 y.o.-year-old female with newly diagnosed IDC of the left breast.   She presented with abnormal mammogram.     Bilateral screening mammogram 02/2019 showed left breast 3.1 x 2 cm hypoechoic mass and midly enlarged axillary node abnormality.     CNB left breast mass 2 OC and left axillary node 03/07/2019 [facility]) showed:   A) Breast: IDC, G3, ER 0%, PR 0%, Her 2 neu negative (1+) by IHC, Ki 67 80%  B) B) Lymph node, left axillary: benign-appearing lymph node.    Bilateral breast MRI and Genetic consult ordered. Path review pending.       Breast Cancer Risk Factors:  Age at Menarche:  14  Age at 67st FTP:  3    Still menstruating?:  No   Last menses: 2008   Hysterectomy: Yes 2008 TAH+ left oophorectomy   Estrogen Therapy:          Oral Contraceptives: No           Hormone  Replacement Therapy:  No         Reproductive Assistance Therapies:  No  Family History of Cancer:            Breast:   Yes maternal aunt age 5, maternal cousin age 57, paternal cousin age 19,       Ashkenazi Jewish Ancestry: No     Genetic testing: pending       Denies F/C/N/V/diarrhea. No CP/SOB. No HA/visual changes/focal weakness.        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, estrogen receptor negative     04/14/2019 - 06/22/2019 Chemotherapy    cyclophosphamide (CYTOXAN) 1,500 mg in sodium chloride 0.9 % 360 mL chemo infusion, 1,554 mg, Intravenous, Once, 4 of 4 cycles  Administration: 1,500 mg (04/14/2019), 1,500 mg (04/28/2019), 1,500 mg (05/12/2019), 1,500 mg (06/09/2019)  pegfilgrastim (NEULASTA) injection 6 mg, 6 mg, Subcutaneous, Once, 2 of 2 cycles  Administration: 6 mg (04/14/2019), 6 mg (04/29/2019)  pegfilgrastim (NEULASTA)  delivery kit 6 mg, 6 mg, Subcutaneous, Once, 3 of 3 cycles  Administration: 6 mg (04/28/2019), 6 mg (05/12/2019), 6 mg (06/09/2019)  DOXOrubicin (ADRIAMYCIN) 155 mg in sodium chloride 0.9 % 362.5 mL chemo infusion, 60 mg/m2 = 155 mg, Intravenous, Once, 4 of 4 cycles  Administration: 155 mg (04/14/2019), 155 mg (04/28/2019), 155 mg (05/12/2019), 155 mg (06/09/2019)     06/23/2019 -  Chemotherapy    CARBOplatin (PARAPLATIN) 225 mg in sodium chloride 0.9 % 307.5 mL chemo infusion, 225 mg, Intravenous, Once, 2 of 2 cycles  Administration: 225 mg (06/23/2019), 225 mg (06/30/2019), 225 mg (07/07/2019), 225 mg (07/14/2019)  PACLitaxel (TAXOL) 210 mg in sodium chloride 0.9 % 320 mL chemo infusion, 80 mg/m2 = 210 mg, Intravenous, Once, 4 of 4 cycles  Administration: 210 mg (06/23/2019), 210 mg (06/30/2019), 210 mg (07/07/2019), 210 mg (07/14/2019), 210 mg (07/28/2019), 210 mg (08/04/2019), 210 mg (08/11/2019), 210 mg (08/18/2019), 210 mg (08/25/2019), 210 mg (09/01/2019)        Cancer Staging  No matching staging information was found for the patient.    Past Medical History:   Diagnosis Date    Fibroid     Herpes simplex without mention of complication 2003    possible recurrance in 2013    Hypertension     Lymphedema of extremity 11/03/2017    Neurologic disorder     brachial neuritis    Obesity     Urinary tract infection     Vitamin D deficiency      Past Surgical History:   Procedure Laterality Date    HYSTERECTOMY  2008    fibroids, readmitted for pelvic abscess formation POD#3    MEDIPORT CHECK Right 06/08/2019    Procedure: MEDIPORT CHECK;  Surgeon: Rex Kras, MD;  Location: [facility];  Service: Interventional Radiology;  Laterality: Right;    MEDIPORT PLACEMENT N/A 04/08/2019    Procedure: MEDIPORT PLACEMENT;  Surgeon: Pollyann Kennedy, MD;  Location: [facility];  Service: Interventional Radiology;  Laterality: N/A;    OTHER SURGICAL HISTORY  05/04/2015    Obalon balloon - have 3 balloons placed in stomach for weight loss, Dr. Georgann Housekeeper    SALPINGOOPHORECTOMY Right 2008    RSO with hysterectomy    TUBAL LIGATION  02/17/1997     Social History     Socioeconomic History    Marital status: Legally Separated     Spouse name: None    Number of children: None    Years of education: None    Highest education level: None   Occupational History    None   Tobacco Use    Smoking status: Never Smoker    Smokeless tobacco: Never Used   Vaping Use    Vaping Use: Never used   Substance and Sexual Activity    Alcohol use: Yes     Comment: wine - social, 2-4 times a month    Drug use: No    Sexual activity: Yes     Partners: Male   Other Topics Concern    None   Social History Narrative    Has elliptical machine        Lives with boyfriend, daughter home        Son died on 2019/01/27 in CCU            Mom staying with her, good support system 05/30/2019     Social Determinants of Health     Financial Resource Strain:  Difficulty of Paying Living Expenses:    Food Insecurity:      Worried About Programme researcher, broadcasting/film/video in the Last Year:     Barista in the Last Year:    Transportation Needs:     Freight forwarder (Medical):     Lack of Transportation (Non-Medical):    Physical Activity: Insufficiently Active    Days of Exercise per Week: 7 days    Minutes of Exercise per Session: 20 min   Stress:     Feeling of Stress :    Social Connections:     Frequency of Communication with Friends and Family:     Frequency of Social Gatherings with Friends and Family:     Attends Religious Services:     Active Member of Clubs or Organizations:     Attends Engineer, structural:     Marital Status:    Intimate Partner Violence:     Fear of Current or Ex-Partner:     Emotionally Abused:     Physically Abused:     Sexually Abused:       Family History   Problem Relation Age of Onset    Hypertension Mother     Coronary artery disease Mother     Heart disease Mother     Hyperlipidemia Mother     Hypertension Father     Diabetes Father     Other Father         cardiac catheterization    Hypertension Sister     Breast cancer Maternal Aunt     Uterine cancer Maternal Aunt     Vaginal cancer Maternal Aunt     Breast cancer Other         maternal cousin    Ovarian cancer Neg Hx        Allergies   Allergen Reactions    Valsartan Other (See Comments)     achiness    Penicillins Hives     Symptoms occurred as child  Onset date: 07-28-2004     has a current medication list which includes the following prescription(s): apixaban, lidocaine-prilocaine, losartan-hydrochlorothiazide, potassium chloride, clotrimazole, dexamethasone, dexlansoprazole, gabapentin, nystatin, olanzapine, ondansetron, prochlorperazine, and tizanidine.        REVIEW OF SYSTEMS: 14 pt ROS reviewed with the patient and negative except for as documented in the HPI. All other systems reviewed.      PHYSICAL EXAM:  BP 120/83 (BP Site: Right arm, Patient Position: Sitting, Cuff Size: Large)    Pulse (!)  105    Temp 97.7 F (36.5 C) (Temporal)    Wt 129.7 kg (286 lb)    SpO2 98%    BMI 42.22 kg/m     Performance Status:  0 - Fully active, able to carry on all pre-disease performance without restriction         General Appearance: Alert, cooperative, no distress, appears stated age   HEENT: oral thrush   Lungs: Clear to auscultation bilaterally, respirations unlabored  Chest Wall: No tenderness or deformity   Port over right superior chest wall without edea or erythema but tenderness to area  Heart: Regular rate and rhythm, S1 and S2 normal, no murmur, rub or gallop  Breast Exam: Deferred  Abdomen: Soft, non-tender, bowel sounds active all four quadrants, no masses, no organomegaly  Extremities: Extremities normal, atraumatic, no cyanosis or edema  Skin:  Skin color, texture, turgor normal, no rashes or lesions  Lymph Nodes:  Cervical, supraclavicular,  and axillary nodes normal    Labs Results:  Reviewed in epic today  Lab Results   Component Value Date    WBC 6.19 09/01/2019    HGB 9.7 (L) 09/01/2019    HCT 28.9 (L) 09/01/2019    MCV 95.4 09/01/2019    PLT 227 09/01/2019     Lab Results   Component Value Date    CREAT 1.2 09/01/2019    BUN 10.0 09/01/2019    NA 134 (L) 09/01/2019    K 2.9 (L) 09/01/2019    CL 99 09/01/2019    CO2 32 (H) 09/01/2019     Lab Results   Component Value Date    ALT 34 09/01/2019    AST 40 (H) 09/01/2019    ALKPHOS 82 08/18/2019    BILITOTAL 0.3 08/18/2019       Imaging Results: none this visit     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    C1D1 AC on 04/14/2019.   C4D1 AC on 06/09/19   Started carbotaxol on 06/23/2019. Was unable to tolerate.   Dropped carbo and continued with single agent paclitaxel on C2D8  C3D1 Taxol on 08/11/19. Continue weekly taxol.   C4D1 Taxol on 7/15/ 21         Followed by cardio-oncology.  BaselineECHO2/23/2021 showed EF 66% and GLS -19%.  ECHOpost C#2 AC 3/17/2021showed EF 64% and GLS -19%.  Echo post C4 AC- 4/19/ 21 showed EF 64% GLS -19. %      Doing fair with treatment.   Mostly complains of worsening neuropathy and myalgias along with progressive fatigue.   Discussed starting gabapentin 300 mg qhs . Rx provided and side effect discussed.   Patient would really like to d/c treatment if possible. Advised I would review with Dr. Ladell Heads and contact her.   Follow up on other issues.     Oral thrush- cleared   Nausea: continue Ativan as needed. Not taking zyprexa   Dyspepsia: dexilant not covered by insurance. Has not tried prilosec. Rec'ed again to try.   Bone pain in legs: Continue with Tramadol/Advil as needed. Advil works better.   Progressed fatigue:  low normal Vitamin B12 ( start 1000 mcg sublingual) , iron stat low ( start Vitron-C once a day),   Epistaxis: recommended humidifier and nasal saline  BRBPR: no longer occuring  Cough: not as consistent          Left breast sonogram 07/04/2019 showed reduction in size left breast mass to 1.1 x 0.4 x 0.4 cm.   Has breast MRI scheduled for this coming Monday.   Rapport Dr. Domenick Bookbinder on 09/14/2019.  Expected completion of chemotherapy 09/15/2019.    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   On exam only tenderness but no new edema or erythema   -sent for f/u doppler sonogram to re- evaluate status of thrombus      RTC 09/26/2019 at 12:40 with Dr. Ladell Heads  for PE and  labs (CBC and CMP).       All questions were answered. This patient is being monitored while on high risk medication and intensive monitoring to prevent,  detect and treat the toxicities of this treatment with frequent labs and provider assessments and additional imaging per protocol.    Vivia Budge, PA      Addendum: Reviewed case with Dr. Ladell Heads.   Plan to delay Taxol tx by 1 week and final tx would be 20% dose reduction.   Left VM for patient to discuss.

## 2019-09-07 NOTE — Telephone Encounter (Signed)
Left VM for pt.   Reviewed pt case w/ Dr. Ladell Heads   Plan for tx delay X 1 week.   Next week tx will be final dose and needs to be 20% reduced.     Also, need to discuss starting Vitron C- once a day and Vitamin B12 1000 mcg over the counter and folic acid 1 mg daily per recent anemia labs.       Delice Bison- please contact infusion to delay and inform pharmacy team of dose reduction.

## 2019-09-07 NOTE — Telephone Encounter (Signed)
Received a call from Dr. Karel Jarvis from [facility] radiology dept. regarding results from today's US Venous Doppler.     -States report will be sent and should be viewable in Epic.  States "DVT Right Internal Jugular is still present and similar to previous scan in 05/2019." RN asked for call back # was given ext [phone].    Phone RN will route call to Dr. Ladell Heads Care Team  ordered by Teena Irani, PA.

## 2019-09-07 NOTE — Telephone Encounter (Addendum)
RN spoke with pt and re-inforced [name] message regarding treatment delay and dose reduction. C4D8 skipped, C4D15 on 7/29.    RN also relayed information regarding Vitron-C, B12, and folic acid. Pt wrote down dosages and VU to begin taking. She wonders about continuing oral potassium, as she was given very short supply on 7/15. Message to [place].    Shy, can you please update the treatment plan for C4D15 - will be 20% dose reduced per below.

## 2019-09-07 NOTE — Telephone Encounter (Signed)
RN called and notified pt of results. Pt VU, No further questions or concerns at this time.

## 2019-09-08 ENCOUNTER — Ambulatory Visit: Payer: Commercial Managed Care - POS

## 2019-09-08 MED ORDER — POTASSIUM CHLORIDE ER 10 MEQ PO CPCR
10.00 meq | ORAL_CAPSULE | Freq: Two times a day (BID) | ORAL | 0 refills | Status: DC
Start: 2019-09-08 — End: 2019-11-28

## 2019-09-08 NOTE — Telephone Encounter (Addendum)
RN LMTCB to relay message to pt. Lab order placed, need to confirm where pt will have drawn.

## 2019-09-08 NOTE — Addendum Note (Signed)
Addended by: Celene Kras on: 09/08/2019 10:06 AM     Modules accepted: Orders

## 2019-09-08 NOTE — Telephone Encounter (Signed)
rx sent for micro k 20 meq X 1 week.

## 2019-09-09 ENCOUNTER — Other Ambulatory Visit: Payer: Self-pay | Admitting: Internal Medicine

## 2019-09-09 ENCOUNTER — Encounter: Payer: Self-pay | Admitting: Internal Medicine

## 2019-09-09 NOTE — Telephone Encounter (Addendum)
RN received [facility] from pt, returned call.  Pt confirms she has picked up micro-k prescription, and will take it BID as instructed. She will already have CMP checked 7/29 for treatment, no need for additional potassium lab order. Order cancelled.    RN also reviewed instructions for Vitron C and B12: per pharmacy, just once daily is fine, time of day up to patient, with or without food.   Pt inquired about dose of folic acid, informed her that 1mg  or 1,000 mcg is fine as it is same dose, but doses on shelves she is seeing are likely in mcg. Pt VU. All meds added to med list.    Pt also inquired if C4D15 tx on 7/29 would be necessary if breast MRI came back clear, informed pt that it would still be necessary.    No further questions or concerns at this time.

## 2019-09-09 NOTE — Progress Notes (Signed)
Duplicate. Will follow up via TE.

## 2019-09-09 NOTE — Addendum Note (Signed)
Addended by: Celene Kras on: 09/09/2019 11:19 AM     Modules accepted: Orders

## 2019-09-10 ENCOUNTER — Other Ambulatory Visit (HOSPITAL_BASED_OUTPATIENT_CLINIC_OR_DEPARTMENT_OTHER): Payer: Self-pay | Admitting: Surgery

## 2019-09-10 DIAGNOSIS — C50912 Malignant neoplasm of unspecified site of left female breast: Secondary | ICD-10-CM

## 2019-09-12 ENCOUNTER — Ambulatory Visit
Admission: RE | Admit: 2019-09-12 | Discharge: 2019-09-12 | Disposition: A | Discharge status: Home / Self Care | Payer: Commercial Managed Care - POS | Source: Ambulatory Visit | Attending: Surgery | Admitting: Surgery

## 2019-09-12 ENCOUNTER — Telehealth: Payer: Self-pay | Admitting: Surgery

## 2019-09-12 ENCOUNTER — Encounter (HOSPITAL_BASED_OUTPATIENT_CLINIC_OR_DEPARTMENT_OTHER): Payer: Self-pay

## 2019-09-12 DIAGNOSIS — Z9221 Personal history of antineoplastic chemotherapy: Secondary | ICD-10-CM | POA: Insufficient documentation

## 2019-09-12 DIAGNOSIS — C50912 Malignant neoplasm of unspecified site of left female breast: Secondary | ICD-10-CM | POA: Insufficient documentation

## 2019-09-12 MED ORDER — GADOBUTROL 1 MMOL/ML IV SOLN
10.00 mL | Freq: Once | INTRAVENOUS | Status: AC | PRN
Start: 2019-09-12 — End: 2019-09-12
  Administered 2019-09-12: 14:00:00 10 mmol via INTRAVENOUS
  Filled 2019-09-12: qty 10

## 2019-09-12 NOTE — Progress Notes (Addendum)
Patient has already seen Plastics - Dr. Gerrie Nordmann

## 2019-09-13 ENCOUNTER — Encounter: Payer: Self-pay | Admitting: Internal Medicine

## 2019-09-14 ENCOUNTER — Ambulatory Visit (INDEPENDENT_AMBULATORY_CARE_PROVIDER_SITE_OTHER): Payer: Commercial Managed Care - POS | Admitting: Surgery

## 2019-09-14 ENCOUNTER — Encounter (HOSPITAL_BASED_OUTPATIENT_CLINIC_OR_DEPARTMENT_OTHER): Payer: Self-pay

## 2019-09-14 ENCOUNTER — Encounter (HOSPITAL_BASED_OUTPATIENT_CLINIC_OR_DEPARTMENT_OTHER): Payer: Self-pay | Admitting: Surgery

## 2019-09-14 VITALS — BP 128/85 | HR 111 | Temp 97.3°F | Resp 20 | Ht 69.0 in | Wt 281.8 lb

## 2019-09-14 DIAGNOSIS — C50912 Malignant neoplasm of unspecified site of left female breast: Secondary | ICD-10-CM

## 2019-09-14 DIAGNOSIS — I82621 Acute embolism and thrombosis of deep veins of right upper extremity: Secondary | ICD-10-CM

## 2019-09-14 NOTE — Progress Notes (Signed)
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 she has been having side effects and doing well otherwise. Set to complete chemo tomorrow- last week cycle was held due to labs. She had her post chemo MRI yesterday and showed resolution of known cancer. She is very weak from chemo. Patient denies any palpable masses, skin changes, nipple inversion/retraction/discharge, adenopathy, breast pain. Patient is accompanied by mother.       Rad/Path: [place]  09/13/2019-MRI BREAST BILATERAL WITHOUT AND WITH CONTRAST    HISTORY: Follow-up left breast cancer status post neoadjuvant chemotherapy.  The patient had an outside ultrasound-guided biopsy of a mass at the 2:00  position on 03/07/2019 with pathology of invasive ductal carcinoma. The  patient had a benign left axillary biopsy. Please note that these outside  images are not available for comparison.    TECHNIQUE: Axial T2-weighted, axial non fat saturated, axial pre and  dynamic postcontrast and sagittal reformatted images of both breasts using  a dedicated breast coil. Subtraction images were generated from the pre-  and postcontrast data set and 3D images were created and reviewed  separately on an independent workstation. Computer aided detection applied.    Contrast: Gadavist 10 cc        COMPARISON: [facility] ultrasound from 07/04/2019; [facility] MRI from 03/25/2019    FINDINGS:  Amount of glandular tissue: There is scattered fibroglandular tissue.  Background parenchymal enhancement:  Mild.    In the left breast at 3:00 there is a biopsy marker with interval  resolution of the previously seen mass. This is compatible with the  reported biopsy proven malignancy.    There is no definite suspicious early or  abnormal enhancement seen  bilaterally.    There is a chest port in the right upper inner quadrant. There is no bulky  adenopathy.    IMPRESSION:     Left breast 3:00 biopsy marker with interval resolution of the previously  seen mass compatible with the site of reported biopsy proven malignancy.    BIRADS CATEGORY 06-KNOWN BIOPSY PROVEN MALIGNANCY    Electronically signed by: Marius Ditch M.D.  [Interpreted at: 41BCF]  [facility]    MB: 09/12/19      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, estrogen receptor negative     04/14/2019 -  Chemotherapy    cyclophosphamide (CYTOXAN) 1,500 mg in sodium chloride 0.9 % 360 mL chemo infusion, 1,554 mg, Intravenous, Once, 2 of 4 cycles  Administration: 1,500 mg (04/14/2019), 1,500 mg (04/28/2019)  pegfilgrastim (NEULASTA) injection 6 mg, 6 mg, Subcutaneous, Once, 2 of 2 cycles  Administration: 6 mg (04/14/2019), 6 mg (04/29/2019)  pegfilgrastim (NEULASTA) delivery kit 6 mg, 6 mg, Subcutaneous, Once, 1 of 3 cycles  Administration: 6 mg (04/28/2019)  DOXOrubicin (ADRIAMYCIN) 155 mg in sodium chloride 0.9 % 362.5 mL chemo infusion, 60 mg/m2 = 155 mg, Intravenous, Once, 2 of 4 cycles  Administration: 155 mg (04/14/2019), 155 mg (04/28/2019)     06/06/2019 -  Chemotherapy    CARBOplatin (PARAPLATIN) 224.25 mg in sodium chloride 0.9 % 272.43  mL chemo infusion, 224.25 mg, Intravenous, Once, 0 of 4 cycles  PACLitaxel (TAXOL) 210 mg in sodium chloride 0.9 % 285 mL chemo infusion, 80 mg/m2, Intravenous, Once, 0 of 4 cycles         Obstetric History  OB History   Gravida Para Term Preterm AB Living   3 3 3  0 0 2   SAB TAB Ectopic Multiple Live Births   0 0 0 0 2       Past Medical History:   Diagnosis Date    Fibroid     Herpes simplex without mention of complication 2003    possible recurrance in 2013     Hypertension     Lymphedema of extremity 11/03/2017    Neurologic disorder     brachial neuritis    Obesity     Urinary tract infection     Vitamin D deficiency    :    Past Surgical History:   Procedure Laterality Date    HYSTERECTOMY  2008    fibroids, readmitted for pelvic abscess formation POD#3    MEDIPORT CHECK Right 06/08/2019    Procedure: MEDIPORT CHECK;  Surgeon: Rex Kras, MD;  Location: [facility];  Service: Interventional Radiology;  Laterality: Right;    MEDIPORT PLACEMENT N/A 04/08/2019    Procedure: MEDIPORT PLACEMENT;  Surgeon: Pollyann Kennedy, MD;  Location: [facility];  Service: Interventional Radiology;  Laterality: N/A;    OTHER SURGICAL HISTORY  05/04/2015    [place] balloon - have 3 balloons placed in stomach for weight loss, Dr. Georgann Housekeeper    SALPINGOOPHORECTOMY Right 2008    RSO with hysterectomy    TUBAL LIGATION  02/17/1997   :      Current Outpatient Medications:     apixaban (ELIQUIS) 5 MG, Take 1 tablet (5 mg total) by mouth every 12 (twelve) hours, Disp: 60 tablet, Rfl: 3    clotrimazole (MYCELEX) 10 MG troche, Take 1 tablet (10 mg total) by mouth 3 (three) times daily, Disp: 21 tablet, Rfl: 0    dexamethasone (DECADRON) 4 MG tablet, Take 8 mg (2 tabs) on days 2, 3, and 4 for breakfast and days 3 and 4 for lunch of each chemotherapy cycle., Disp: 40 tablet, Rfl: 0    dexlansoprazole 60 MG capsule, Take 1 capsule (60 mg total) by mouth daily, Disp: 60 capsule, Rfl: 1    folic acid (FOLVITE) 1 MG tablet, Take 1 mg by mouth daily, Disp: , Rfl:     gabapentin (NEURONTIN) 100 MG capsule, Take 3 capsules (300 mg total) by mouth 3 (three) times daily, Disp: 90 capsule, Rfl: 1    Iron-Vitamin C (VITRON-C PO), Take by mouth daily, Disp: , Rfl:     lidocaine-prilocaine (EMLA) cream, Apply topically as needed (one hour prior to chemotherapy appointment), Disp: 30 g, Rfl: 3    losartan-hydrochlorothiazide (HYZAAR) 100-25 MG per tablet, Take 1 tablet by mouth daily, Disp: 90 tablet,  Rfl: 1    nystatin (MYCOSTATIN) 100000 UNIT/ML suspension, Take 5 mLs (500,000 Units total) by mouth 4 (four) times daily, Disp: 60 mL, Rfl: 2    OLANZapine (ZyPREXA) 5 MG tablet, Take one tab at bedtime days 1,2,3, and 4 of each chemotherapy cycle, Disp: 30 tablet, Rfl: 0    ondansetron (Zofran ODT) 8 MG disintegrating tablet, Take 1 tablet (8 mg total) by mouth every 8 (eight) hours as needed for Nausea, Disp: 60 tablet, Rfl: 3    potassium chloride (MICRO-K) 10 MEQ  CR capsule, Take 1 capsule (10 mEq total) by mouth 2 (two) times daily, Disp: 14 capsule, Rfl: 0    prochlorperazine (COMPAZINE) 10 MG tablet, Take 1 tablet (10 mg total) by mouth every 6 (six) hours as needed (nausea), Disp: 30 tablet, Rfl: 3    tiZANidine (Zanaflex) 4 MG tablet, One tab every 8 hrs as needed sparingly to relax muscles, Disp: 30 tablet, Rfl: 0    vitamin B-12 (CYANOCOBALAMIN) 1000 MCG tablet, Take 1,000 mcg by mouth daily, Disp: , Rfl:     Allergies:  Valsartan and Penicillins    Family History:  Her family history includes Breast cancer in her maternal aunt and another family member; Coronary artery disease in her mother; Diabetes in her father; Heart disease in her mother; Hyperlipidemia in her mother; Hypertension in her father, mother, and sister; Other in her father; Uterine cancer in her maternal aunt; Vaginal cancer in her maternal aunt.    Social History:  Social History     Socioeconomic History    Marital status: Legally Separated     Spouse name: None    Number of children: None    Years of education: None    Highest education level: None   Occupational History    None   Tobacco Use    Smoking status: Never Smoker    Smokeless tobacco: Never Used   Haematologist Use: Never used   Substance and Sexual Activity    Alcohol use: Yes     Comment: wine - social, 2-4 times a month    Drug use: No    Sexual activity: Yes     Partners: Male   Other Topics Concern    None   Social History Narrative    Has  elliptical machine        Lives with boyfriend, daughter home        Son died on 25-Jan-2019 in CCU            Mom staying with her, good support system 05/30/2019     Social Determinants of Health     Financial Resource Strain:     Difficulty of Paying Living Expenses:    Food Insecurity:     Worried About Programme researcher, broadcasting/film/video in the Last Year:     Barista in the Last Year:    Transportation Needs:     Freight forwarder (Medical):     Lack of Transportation (Non-Medical):    Physical Activity: Insufficiently Active    Days of Exercise per Week: 7 days    Minutes of Exercise per Session: 20 min   Stress:     Feeling of Stress :    Social Connections:     Frequency of Communication with Friends and Family:     Frequency of Social Gatherings with Friends and Family:     Attends Religious Services:     Active Member of Clubs or Organizations:     Attends Engineer, structural:     Marital Status:    Intimate Partner Violence:     Fear of Current or Ex-Partner:     Emotionally Abused:     Physically Abused:     Sexually Abused:        Review of Systems  Review of Systems   Constitutional: Positive for fatigue.   HENT: Negative.    Eyes: Negative.    Respiratory: Negative.    Cardiovascular: Negative.  Gastrointestinal: Positive for abdominal pain.   Endocrine: Negative.    Genitourinary: Negative.    Musculoskeletal: Positive for gait problem.   Skin: Negative.    Allergic/Immunologic: Negative.    Hematological: Negative.    Psychiatric/Behavioral: Negative.        Physical Exam  BP 128/85 (BP Site: Left arm, Patient Position: Sitting)    Pulse (!) 111    Temp 97.3 F (36.3 C) (Temporal)    Resp 20    Ht 1.753 m (5\' 9" )    Wt 127.8 kg (281 lb 12.8 oz)    SpO2 100%    BMI 41.61 kg/m   Breast: Bilateral breast symmetric. Ptosis Grade 3. RIGHT Breast: No palpable masses, no skin changes, no nipple inversion/retraction/discharge, no adenopathy LEFT Breast: No palpable masses, no skin  changes, no nipple inversion/retraction/discharge, no adenopathy.       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 Clinically StageII (cT2N0M0) on NAC to complete tomorrow.      Plan:   Patient aware I am leaving [place]- will see my partner for surgery Dr.Edminston- she is interested in BCT with oncoplastics Dr.Mesbahi and [facility].  Call prior if any issues      Dairl Ponder. Tye Savoy, MD    Dairl Ponder. Tye Savoy MD  Surgeon, [facility]  [facility] Department of Surgery    09/14/2019 4:01 PM    No images are attached to the encounter.    No Recipients

## 2019-09-14 NOTE — Progress Notes (Signed)
Patient's Last Chemo 07/29.    Post Chemo MRI with [facility] on 07/26 @ 11:15 am @ [facility] .    Transfer of care for surgical planning with Dr. Cyndi Bender on 08/09 @ 2 pm @ [facility] [address]

## 2019-09-15 ENCOUNTER — Ambulatory Visit: Payer: Commercial Managed Care - POS

## 2019-09-15 ENCOUNTER — Ambulatory Visit: Payer: Commercial Managed Care - POS | Attending: Internal Medicine

## 2019-09-15 VITALS — BP 147/81 | HR 100 | Temp 98.2°F | Wt 283.0 lb

## 2019-09-15 DIAGNOSIS — E860 Dehydration: Secondary | ICD-10-CM | POA: Insufficient documentation

## 2019-09-15 DIAGNOSIS — C50412 Malignant neoplasm of upper-outer quadrant of left female breast: Secondary | ICD-10-CM | POA: Insufficient documentation

## 2019-09-15 DIAGNOSIS — Z5111 Encounter for antineoplastic chemotherapy: Secondary | ICD-10-CM | POA: Insufficient documentation

## 2019-09-15 DIAGNOSIS — Z171 Estrogen receptor negative status [ER-]: Secondary | ICD-10-CM | POA: Insufficient documentation

## 2019-09-15 LAB — CBC AND DIFFERENTIAL
Absolute NRBC: 0 10*3/uL (ref 0.00–0.00)
Basophils Absolute Automated: 0.03 10*3/uL (ref 0.00–0.08)
Basophils Automated: 0.4 %
Eosinophils Absolute Automated: 0.1 10*3/uL (ref 0.00–0.44)
Eosinophils Automated: 1.5 %
Hematocrit: 31.2 % — ABNORMAL LOW (ref 34.7–43.7)
Hgb: 10.2 g/dL — ABNORMAL LOW (ref 11.4–14.8)
Immature Granulocytes Absolute: 0.03 10*3/uL (ref 0.00–0.07)
Immature Granulocytes: 0.4 %
Lymphocytes Absolute Automated: 1.68 10*3/uL (ref 0.42–3.22)
Lymphocytes Automated: 24.6 %
MCH: 31.1 pg (ref 25.1–33.5)
MCHC: 32.7 g/dL (ref 31.5–35.8)
MCV: 95.1 fL (ref 78.0–96.0)
MPV: 10.1 fL (ref 8.9–12.5)
Monocytes Absolute Automated: 0.76 10*3/uL (ref 0.21–0.85)
Monocytes: 11.1 %
Neutrophils Absolute: 4.24 10*3/uL (ref 1.10–6.33)
Neutrophils: 62 %
Nucleated RBC: 0 /100 WBC (ref 0.0–0.0)
Platelets: 192 10*3/uL (ref 142–346)
RBC: 3.28 10*6/uL — ABNORMAL LOW (ref 3.90–5.10)
RDW: 15 % (ref 11–15)
WBC: 6.84 10*3/uL (ref 3.10–9.50)

## 2019-09-15 LAB — GFR: GFR Piccolo(R): 47.8

## 2019-09-15 LAB — RRL - CMP
ALT Piccolo(R): 30 U/L (ref 10–47)
AST Piccolo(R): 30 U/L (ref 11–38)
Albumin Piccolo(R): 3.7 g/dL (ref 3.3–5.0)
Alkaline Phosphatase Piccolo(R): 68 U/L (ref 42–141)
BUN Piccolo(R): 13 mg/dL (ref 7.0–19.0)
Bilirubin Total Piccolo(R): 0.5 mg/dL (ref 0.2–1.6)
CO2 Piccolo(R): 30 mEq/L — ABNORMAL HIGH (ref 21–29)
Calcium Piccolo(R): 9.5 mg/dL (ref 8.5–10.5)
Chloride Piccolo(R): 106 mEq/L (ref 98–107)
Creatinine Piccolo(R): 1.2 mg/dL (ref 0.4–1.5)
Glucose Piccolo(R): 120 mg/dL — ABNORMAL HIGH (ref 70–100)
Potassium Piccolo(R): 4.1 mEq/L (ref 3.5–5.1)
Protein Piccolo(R): 7.1 g/dL (ref 6.4–8.1)
Sodium Piccolo(R): 134 mEq/L — ABNORMAL LOW (ref 136–145)

## 2019-09-15 LAB — RRL - MAGNESIUM: Magnesium Piccolo(R): 2 mg/dL (ref 1.6–2.6)

## 2019-09-15 MED ORDER — SODIUM CHLORIDE (PF) 0.9 % IJ SOLN
5.00 mL | INTRAMUSCULAR | Status: DC | PRN
Start: 2019-09-15 — End: 2019-09-15

## 2019-09-15 MED ORDER — PACLITAXEL 6 MG/ML IV CONC (WRAP)
64.00 mg/m2 | Freq: Once | INTRAVENOUS | Status: AC
Start: 2019-09-15 — End: 2019-09-15
  Administered 2019-09-15: 12:00:00 168 mg via INTRAVENOUS
  Filled 2019-09-15: qty 28

## 2019-09-15 MED ORDER — DIPHENHYDRAMINE HCL 50 MG/ML IJ SOLN
25.00 mg | Freq: Once | INTRAMUSCULAR | Status: DC | PRN
Start: 2019-09-15 — End: 2019-09-15

## 2019-09-15 MED ORDER — FAMOTIDINE 10 MG/ML IV SOLN (WRAP)
20.00 mg | Freq: Once | INTRAVENOUS | Status: AC
Start: 2019-09-15 — End: 2019-09-15
  Administered 2019-09-15: 12:00:00 20 mg via INTRAVENOUS
  Filled 2019-09-15: qty 2

## 2019-09-15 MED ORDER — HEPARIN SOD (PORK) LOCK FLUSH 100 UNIT/ML IV SOLN
5.00 mL | INTRAVENOUS | Status: DC | PRN
Start: 2019-09-15 — End: 2019-09-15
  Administered 2019-09-15: 13:00:00 5 mL

## 2019-09-15 MED ORDER — DEXAMETHASONE IVPB 12 MG IN NS 50 ML
12.00 mg | Freq: Once | INTRAVENOUS | Status: DC | PRN
Start: 2019-09-15 — End: 2019-09-15

## 2019-09-15 MED ORDER — ONDANSETRON IVPB 16 MG IN NS 50 ML
16.00 mg | Freq: Once | INTRAVENOUS | Status: AC
Start: 2019-09-15 — End: 2019-09-15
  Administered 2019-09-15: 12:00:00 16 mg via INTRAVENOUS
  Filled 2019-09-15: qty 50

## 2019-09-15 NOTE — Progress Notes (Signed)
[name]   Visit Date: 09/15/2019        Leslie [name] is a 49 y.o. female patient of Leslie Fantasia, MD      [phone] Taxol (20% reduced dose)  *final treatment day    Since her last visit, she has been doing well.     Overall, she states her energy level is good.  The patient is able to perform most usual functions.. Her appetite is reported to be intact, with no significant changes in weight.. She reports no complaints. She has no other concerns. There are no social work or other referral needs at this time.    *plan is lumpectomy in September followed by XRT.  Advised pt she will need Q3M port flushed is they do not remove port.    Diagnosis: Breast Cancer  Treatment on Clinical Trial: Not On Study (NOS)  [facility](O) - OP Adult - Breast Neoadjuvant - PACLitaxel + CARBOplatin (D1,8,15) Q 21 Days X 4 Cycles (Taxol only from C2D8))  [facility] - OP Adult - Normal Saline Hydration (1000 ml/hr)    Pre-Medications: famotidine (Pepcid) and ondansetron (Zofran)  Took benedryl at home    Line Type: Mediport,    Right Port  Blood Return verified prior to administration: Yes    Vital Signs:  Patient Vitals for the past 12 hrs:   BP Temp Pulse   09/15/19 1129 147/81 98.2 F (36.8 C) 100      Body surface area is 2.5 meters squared.    '  Chemistry        Component Value Date/Time    NA 134 (L) 09/15/2019 1119    K 4.1 09/15/2019 1119    CL 106 09/15/2019 1119    CO2 30 (H) 09/15/2019 1119    BUN 13.0 09/15/2019 1119    CREAT 1.2 09/15/2019 1119    GLU 120 (H) 09/15/2019 1119        Component Value Date/Time    CA 9.5 09/15/2019 1119    ALKPHOS 82 08/18/2019 1057    AST 30 09/15/2019 1119    ALT 30 09/15/2019 1119    BILITOTAL 0.3 08/18/2019 1057            Lab Results   Component Value Date    WBC 6.84 09/15/2019    HGB 10.2 (L) 09/15/2019    PLT 192 09/15/2019    NEUTROABS 4.24 09/15/2019       Administrations This Visit     famotidine (PEPCID) injection 20 mg     Admin Date  09/15/2019  12:00 Action  Given  Dose  20 mg Route  Intravenous Ordering Provider  Leslie Fantasia, MD          heparin 100 UNIT/ML flush 5 mL     Admin Date  09/15/2019  13:29 Action  Given Dose  5 mL Route  Intracatheter Ordering Provider  Leslie Fantasia, MD          ondansetron [facility]) 16 mg in sodium chloride 0.9 % 50 mL IVPB (premix)     Admin Date  09/15/2019  12:03 Action  New Bag Dose  16 mg Rate  200 mL/hr Route  Intravenous Ordering Provider  Leslie Fantasia, MD          PACLitaxel (TAXOL) 168 mg in sodium chloride 0.9 % 313 mL chemo infusion     Admin Date  09/15/2019  12:29 Action  New Bag Dose  168 mg Rate  313 mL/hr Route  Intravenous Ordering Provider  Leslie Fantasia, MD                Chemo Given within the past 12 hours:   "Chemo/Biotherapy Given" (last 12 hours)     Date/Time Action User Medication Dose Rate Dose Volume (mL) Rate    09/15/19 1329 Given    AT heparin 100 UNIT/ML flush 5 mL 5 mL        09/15/19 1229 New Bag    AT PACLitaxel (TAXOL) 168 mg in sodium chloride 0.9 % 313 mL chemo infusion 168 mg 313 mL/hr               Hypersensitivity Reaction Noted: No  IV Post Hydration: No, not required  Treatment Outcome:Patient tolerated treatment well. Central Line flushed and deacessed.    Education: Patient/Family educated regarding the expected outcomes/side effects possible interactions of treatments. Patient verbalizes understanding.    Follow-up Plan: Discharged in stable condition. Accompanied by, mother. Instructed to contact physician if any complications or unmanageable symptoms occur.        [name], RN    09/15/2019  4:06 PM

## 2019-09-18 HISTORY — PX: BREAST LUMPECTOMY: SHX2

## 2019-09-18 HISTORY — PX: REDUCTION MAMMAPLASTY: SUR839

## 2019-09-19 ENCOUNTER — Encounter (HOSPITAL_BASED_OUTPATIENT_CLINIC_OR_DEPARTMENT_OTHER): Payer: Self-pay | Admitting: Surgery

## 2019-09-19 ENCOUNTER — Telehealth (HOSPITAL_BASED_OUTPATIENT_CLINIC_OR_DEPARTMENT_OTHER): Payer: Self-pay

## 2019-09-19 NOTE — Telephone Encounter (Signed)
Spoke with patient by phone regarding surgery.  She states her plan had been left lumpectomy and oncoplasty with right reduction.  I let her know we will finalize the surgery when she comes in on 09/26/19 but are holding 10/14/19 for her surgery with Dr. Cyndi Bender and Dr. Sharen Heck.  Patient expressed understanding and agreement.

## 2019-09-25 NOTE — Progress Notes (Signed)
Treatment Team  Ref Prov: Kc, Lilly, MD   Primary Care Provider:   Kc, Lilly, MD Radiology facility: [facility] ([facility])--[phone] Breast Surgeon : Foster Sonnier MD   Plastic Surgeon Alex Mesbahi, MD [phone] Radiation Onc:  {Stella Hetelekidis MD:  [phone] ([place]); Medical Onc:  Angela Pennisi, MD      Breast Cancer Comprehensive Care Plan Summary  Date of diagnosis: 03/2019 Event : Primary Breast Cancer ECOG Performance: Grade 0  (Fully active) Genetic Testing :genetics consultation requested   Presentation : palpable breast lump Side: left Focality :  unifocal Location: radian 3:00   Biologic Tumor characteristics  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 she has been having side effects and doing well otherwise.     The patient had her last dose of chemotherapy on July 29.  Clinically she is feeling somewhat better.  The patient now presents for definitive surgical planning.    Rad/Path: [facility]  09/13/2019-MRI BREAST BILATERAL WITHOUT AND WITH CONTRAST     HISTORY: Follow-up left breast cancer status post neoadjuvant chemotherapy.  The patient had an outside ultrasound-guided biopsy of a mass at the 2:00  position on 03/07/2019 with  pathology of invasive ductal carcinoma. The  patient had a benign left axillary biopsy. Please note that these outside  images are not available for comparison.     TECHNIQUE: Axial T2-weighted, axial non fat saturated, axial pre and  dynamic postcontrast and sagittal reformatted images of both breasts using  a dedicated breast coil. Subtraction images were generated from the pre-  and postcontrast data set and 3D images were created and reviewed  separately on an independent workstation. Computer aided detection applied.     Contrast: Gadavist 10 cc         COMPARISON: [facility] ultrasound from 07/04/2019; [facility] MRI from 03/25/2019     FINDINGS:  Amount of glandular tissue: There is scattered fibroglandular tissue.  Background parenchymal enhancement:  Mild.     In the left breast at 3:00 there is a biopsy marker with interval  resolution of the previously seen mass. This is compatible with the  reported biopsy proven malignancy.     There is no definite suspicious early or abnormal enhancement seen  bilaterally.     There is a chest port in the right upper inner quadrant. There is no bulky  adenopathy.     IMPRESSION:      Left breast 3:00 biopsy marker with interval resolution of the previously  seen mass compatible with the site of reported biopsy proven malignancy.     BIRADS CATEGORY 06-KNOWN BIOPSY PROVEN MALIGNANCY       Electronically signed by: Marilyn Barry-brooks M.D.  [Interpreted at: [facility]]  [facility]     MB: 09/12/19      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, estrogen receptor negative   03/29/2019 Initial Diagnosis     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, estrogen receptor negative      04/14/2019 -  Chemotherapy     cyclophosphamide (CYTOXAN) 1,500 mg in sodium chloride 0.9 % 360 mL chemo infusion, 1,554 mg, Intravenous, Once, 2 of 4 cycles  Administration: 1,500 mg (04/14/2019), 1,500 mg (04/28/2019)  pegfilgrastim (NEULASTA) injection 6  mg, 6 mg, Subcutaneous, Once, 2 of 2 cycles  Administration: 6 mg (04/14/2019), 6 mg (04/29/2019)  pegfilgrastim (NEULASTA) delivery kit 6 mg, 6 mg, Subcutaneous, Once, 1 of 3 cycles  Administration: 6 mg (04/28/2019)  DOXOrubicin (ADRIAMYCIN) 155 mg in sodium chloride 0.9 % 362.5 mL chemo infusion, 60 mg/m2 = 155 mg, Intravenous, Once, 2 of 4 cycles  Administration: 155 mg (04/14/2019), 155 mg (04/28/2019)      06/06/2019 -  Chemotherapy     CARBOplatin (PARAPLATIN) 224.25 mg in sodium chloride 0.9 % 272.43 mL chemo infusion, 224.25 mg, Intravenous, Once, 0 of 4 cycles  PACLitaxel (TAXOL) 210 mg in sodium chloride 0.9 % 285 mL chemo infusion, 80 mg/m2, Intravenous, Once, 0 of 4 cycles           Obstetric History  OB History   Gravida Para Term Preterm AB Living   3 3 3 0 0 2   SAB TAB Ectopic Multiple Live Births   0 0 0 0 2       Past Medical History:   Diagnosis Date   • Fibroid    • Herpes simplex without mention of complication 2003    possible recurrance in 2013   • Hypertension    • Lymphedema of extremity 11/03/2017   • Neurologic disorder     brachial neuritis   • Obesity    • Urinary tract infection    • Vitamin D deficiency    :    Past Surgical History:   Procedure Laterality Date   • HYSTERECTOMY  2008    fibroids, readmitted for pelvic abscess formation POD#3   • MEDIPORT CHECK Right 06/08/2019    Procedure: MEDIPORT CHECK;  Surgeon: Lim, Hong T, MD;  Location: [facility];  Service: Interventional Radiology;  Laterality: Right;   • MEDIPORT PLACEMENT N/A 04/08/2019    Procedure: MEDIPORT PLACEMENT;  Surgeon: Varma, Jay D, MD;  Location: [facility];  Service: Interventional Radiology;  Laterality: N/A;   • OTHER SURGICAL HISTORY  05/04/2015    Obalon balloon - have 3 balloons placed in stomach for weight loss, Dr. Richard Blosser   • SALPINGOOPHORECTOMY Right 2008    RSO with hysterectomy   • TUBAL LIGATION  02/17/1997   :      Current Outpatient Medications:   •  apixaban (ELIQUIS) 5 MG, Take 1 tablet (5 mg total) by  mouth every 12 (twelve) hours, Disp: 60 tablet, Rfl: 3  •  Iron-Vitamin C (VITRON-C PO), Take by mouth daily, Disp: , Rfl:   •  losartan-hydrochlorothiazide (HYZAAR) 100-25 MG per tablet, Take 1 tablet by mouth daily, Disp: 90 tablet, Rfl: 1  •  ondansetron (Zofran ODT) 8 MG disintegrating tablet, Take 1 tablet (8 mg total) by mouth every 8 (eight) hours as needed for Nausea, Disp: 60 tablet, Rfl: 3  •  potassium chloride (MICRO-K) 10 MEQ CR capsule, Take 1   capsule (10 mEq total) by mouth 2 (two) times daily, Disp: 14 capsule, Rfl: 0  •  vitamin B-12 (CYANOCOBALAMIN) 1000 MCG tablet, Take 1,000 mcg by mouth daily, Disp: , Rfl:   •  clotrimazole (MYCELEX) 10 MG troche, Take 1 tablet (10 mg total) by mouth 3 (three) times daily, Disp: 21 tablet, Rfl: 0  •  dexamethasone (DECADRON) 4 MG tablet, Take 8 mg (2 tabs) on days 2, 3, and 4 for breakfast and days 3 and 4 for lunch of each chemotherapy cycle., Disp: 40 tablet, Rfl: 0  •  dexlansoprazole 60 MG capsule, Take 1 capsule (60 mg total) by mouth daily, Disp: 60 capsule, Rfl: 1  •  folic acid (FOLVITE) 1 MG tablet, Take 1 mg by mouth daily, Disp: , Rfl:   •  gabapentin (NEURONTIN) 100 MG capsule, Take 3 capsules (300 mg total) by mouth 3 (three) times daily, Disp: 90 capsule, Rfl: 1  •  lidocaine-prilocaine (EMLA) cream, Apply topically as needed (one hour prior to chemotherapy appointment), Disp: 30 g, Rfl: 3  •  nystatin (MYCOSTATIN) 100000 UNIT/ML suspension, Take 5 mLs (500,000 Units total) by mouth 4 (four) times daily, Disp: 60 mL, Rfl: 2  •  OLANZapine (ZyPREXA) 5 MG tablet, Take one tab at bedtime days 1,2,3, and 4 of each chemotherapy cycle, Disp: 30 tablet, Rfl: 0  •  prochlorperazine (COMPAZINE) 10 MG tablet, Take 1 tablet (10 mg total) by mouth every 6 (six) hours as needed (nausea), Disp: 30 tablet, Rfl: 3  •  tiZANidine (Zanaflex) 4 MG tablet, One tab every 8 hrs as needed sparingly to relax muscles, Disp: 30 tablet, Rfl: 0    Allergies:  Valsartan and  Penicillins    Family History:  Her family history includes Breast cancer in her maternal aunt and another family member; Coronary artery disease in her mother; Diabetes in her father; Heart disease in her mother; Hyperlipidemia in her mother; Hypertension in her father, mother, and sister; Other in her father; Uterine cancer in her maternal aunt; Vaginal cancer in her maternal aunt.    Social History:  Social History     Socioeconomic History   • Marital status: Legally Separated     Spouse name: None   • Number of children: None   • Years of education: None   • Highest education level: None   Occupational History   • None   Tobacco Use   • Smoking status: Never Smoker   • Smokeless tobacco: Never Used   Vaping Use   • Vaping Use: Never used   Substance and Sexual Activity   • Alcohol use: Yes     Comment: wine - social, 2-4 times a month   • Drug use: No   • Sexual activity: Yes     Partners: Male   Other Topics Concern   • None   Social History Narrative    Has elliptical machine        Lives with boyfriend, daughter home        Son died on 01/14/2019 in CCU            Mom staying with her, good support system 05/30/2019     Social Determinants of Health     Financial Resource Strain:    • Difficulty of Paying Living Expenses:    Food Insecurity:    • Worried About Running Out of Food in the Last Year:    • Ran Out of Food in   the Last Year:    Transportation Needs:    • Lack of Transportation (Medical):    • Lack of Transportation (Non-Medical):    Physical Activity: Insufficiently Active   • Days of Exercise per Week: 7 days   • Minutes of Exercise per Session: 20 min   Stress:    • Feeling of Stress :    Social Connections:    • Frequency of Communication with Friends and Family:    • Frequency of Social Gatherings with Friends and Family:    • Attends Religious Services:    • Active Member of Clubs or Organizations:    • Attends Club or Organization Meetings:    • Marital Status:    Intimate Partner Violence:     • Fear of Current or Ex-Partner:    • Emotionally Abused:    • Physically Abused:    • Sexually Abused:        Review of Systems  Review of Systems   Constitutional: Positive for fatigue.   HENT: Negative.    Eyes: Negative.    Respiratory: Negative.    Cardiovascular: Negative.    Gastrointestinal: Positive for abdominal pain.   Endocrine: Negative.    Genitourinary: Negative.    Musculoskeletal: Positive for gait problem.   Skin: Negative.    Allergic/Immunologic: Negative.    Hematological: Negative.    Psychiatric/Behavioral: Negative.        Physical Exam  BP 139/89 (BP Site: Left arm, Patient Position: Sitting)    Pulse 94    Temp 98 °F (36.7 °C) (Temporal)    Resp 15    Ht 1.753 m (5' 9")    Wt 127.5 kg (281 lb)    SpO2 97%    BMI 41.50 kg/m²   WDWN female in NAD  HEENT: Eyes- Clear, no scleral icterus,      Ears/Nose- WNL Hearing-WNL      Mouth-WNL  Neck:  No thyromegaly or masses, trachea midline  Chest: Respiratory effort normal   Palpation of the chest wall: No tenderness, asymmetry, or crepitus   Lungs-Clear to ascultation   CV:  RRR without murmurs or gallops, no pedal edema  BJM:  Gait and station normal  Ext:  No CCE,   Upper Extremity Range of Motion: WNL   Skin:  Free of significant ulcers or lesions  Neuro:  Grossly intact, no focal findings, alert and oriented, asks appropriate questions  LN:  No axillary, supraclavicular or cervical adenopathy  Breast:    Symmetry:Symmetric   Ptosis grade  3     Right breast:     Presence:Present    Skin:Skin color, texture, turgor normal. No rashes or lesions.     Nipple:normal    Tissue: Within normal limits     Left breast:     Presence:Present    Skin:Skin color, texture, turgor normal. No rashes or lesions.     Nipple:normal    Tissue: Within normal limits    Assessment: 48 y.o. 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 Clinically Stage II (cT2N0M0) on NAC to complete tomorrow.    We have discussed the surgical options after  neoadjuvant chemotherapy both in terms of her breast and her axillary lymph nodes. In terms of her breast, we have discussed the options, benefits and risks of:   1. Mastectomy with reconstruction  2. Lumpectomy with image guided needle localization and radiation  Amongst the options for surgery, we have discussed breast conservation surgery versus   mastectomy and mastectomy with reconstruction.  Breast conservation surgery is used in combination with radiation treatment.  I have explained that in appropriately selected patients these options provide similar outcomes in terms of local-regional control and survival of breast cancer. We take into account the size of the tumor relative to the size of the breast in order to provide the best margins and the best cosmetic outcome.     Ms. Gibbins is an excellent candidate for breast conservation. She is interested in this approach.     ------ Technical aspects of surgery and reconstruction options ------    In breast conservation surgery the goal is to remove the tumor with a surrounding rim of normal tissue, usually a minimum 1-2 mm distance from the tumor to the margin of resection. If the original surgery provides a positive or a narrow margin then consideration is given to a second surgery in order to obtain negative margins. Rarely, a mastectomy may be required (in a subsequent surgery) if the initial surgical procedures do not achieve negative margins. Mild pain, ecchymosis and a gentle scar are common after lumpectomy., I anticipate a moderate volume of breast tissue will be removed with the lumpectomy and it is likely to  cause distortion of the breast size and shape. I will sculpt and realign the remaining breast tissue to restore a natural appearance to the breast shape.    1. Localization of the clip-  I will do intraoperative localization with ultrasound and then place a localizing Kopan's wire.  The risks include bleeding, infection and need for further  surgery.  Patient will need to get a follow-up mammogram to determine the exact location of the clip and to ensure that this is an ultrasound visible clip.    2. Pain control- We have discussed pain control options including the importance of a surgical bra.  The majority of patients do well with NSAIDS and tylenol based on ERAS concepts.  Will prescribe roxicodone as needed.  I have answered all her questions.     3. Oncoplastic tissue rearrangement- I anticipate a moderate volume of breast tissue will be removed with the lumpectomy and it is likely to  cause distortion of the breast size and shape.  The patient is very interested in a contralateral breast reduction which is appropriate to achieve symmetry.  The patient is going to follow-up with Dr. Mesbahi.    4. Risks- Mild pain, ecchymosis and an incision related scare are common after lumpectomy.  Additional potential complications include but not exclusively anesthetic complications, bleeding requiring surgical evacuation, infection, fluid retention, clip retention, DVT/PE, pneumonia, and recurrence.     I explained that the current clinical management of the axillary lymph nodes after neoadjuvant chemotherapy is evolving based upon currently available data and studies.   At this time options include:  1. Axillary dissection in the situation where the patient still has palpably enlarged lymph nodes after completion of neoadjuvant chemotherapy. In addition, a full axillary lymph node dissection is considered if the goal is to potentially avoid radiation therapy in the context of a complete pathologic response to neoadjuvant chemotherapy)  2. Sentinel lymph node biopsy  (If radiation therapy will be utilized)               A. Axillary dissection only if the sentinel node remains positive               B. No axillary dissection if the sentinel node is negative   I explained   the concept of sentinel node biopsy as a technique to identify the lymph nodes most  likely to evacuate fluid and cells from the breast. , Successful identification of the sentinel lymph node occurs approximately 99% of the time.  The false-negative rate (FNR)  in the setting of post neoadjuvant chemotherapy can be up to 14% in patients with previously positive involvement.  The literature would suggest that this has minimal impact on survival.   In clinically node- negative patients, the FNR is approximately 7%.  Factors that impact FNR include the changes in the lymphatics caused by the chemotherapy, the use of both Tech99 SC and blue dye, and the number of lymph nodes removed.     Imaging with MRI, ultrasound and PET have been shown to only be modestly helpful in identifying pre-op nodal status after neoadjuvant chemotherapy.     Sentinel lymph nodes are removed and submitted for pathologic evaluation. Potential complications from this are very rare and may include pain or numbness on the inner and posterior aspect of the arm, chronic pain and lymphedema.     Overall  At this point, I would recommend:  We will need to hold her Eliquis for approximately 72 hours prior to surgery.  Will restart her Eliquis right after surgery.  Bilateral mammogram  Left Partial segmental mastectomy with image guided needle localization with possible oncoplastic tissue rearrangement and MarginProbe assessment  A sentinel lymph node biopsy with frozen section. If the frozen section is negative, I will wait for the final pathology to make a decision regarding the need for an axillary node dissection. If the frozen section is positive, I will proceed with an axillary lymph node dissection at the same time.      Reference:  Chehade, H, et al (2016) Is sentinel lymph node biopsy a viable aternative to complete axillary dissection following neoadjuvant chemotherapy in women with node positive breast cancer at diagnosis? An updated meta-analysis involving 3,398 patients.  Amer J of Surg.212(5) p. [phone].

## 2019-09-25 NOTE — H&P (View-Only) (Signed)
Treatment Team  Ref Prov: Satira Mccallum, MD   Primary Care Provider:   Satira Mccallum, MD Radiology facility: [facility])--[phone] Breast Surgeon : Henderson Baltimore MD   Plastic Surgeon Joaquim Nam, MD [phone] Radiation Onc:  {Stella Hetelekidis MD:  [phone] Morene Antu Alton); Medical Onc:  [name], MD      Breast Cancer Comprehensive Care Plan Summary  Date of diagnosis: 03/2019 Event : Primary Breast Cancer ECOG Performance: Grade 0  (Fully active) Genetic Testing :genetics consultation requested   Presentation : palpable breast lump Side: left Focality :  unifocal Location: radian 3:00   Biologic Tumor characteristics  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 she has been having side effects and doing well otherwise.     The patient had her last dose of chemotherapy on July 29.  Clinically she is feeling somewhat better.  The patient now presents for definitive surgical planning.    Rad/Path: [place]  09/13/2019-MRI BREAST BILATERAL WITHOUT AND WITH CONTRAST    HISTORY: Follow-up left breast cancer status post neoadjuvant chemotherapy.  The patient had an outside ultrasound-guided biopsy of a mass at the 2:00  position on 03/07/2019 with  pathology of invasive ductal carcinoma. The  patient had a benign left axillary biopsy. Please note that these outside  images are not available for comparison.    TECHNIQUE: Axial T2-weighted, axial non fat saturated, axial pre and  dynamic postcontrast and sagittal reformatted images of both breasts using  a dedicated breast coil. Subtraction images were generated from the pre-  and postcontrast data set and 3D images were created and reviewed  separately on an independent workstation. Computer aided detection applied.    Contrast: Gadavist 10 cc        COMPARISON:  ultrasound from 07/04/2019; [facility] MRI from 03/25/2019    FINDINGS:  Amount of glandular tissue: There is scattered fibroglandular tissue.  Background parenchymal enhancement:  Mild.    In the left breast at 3:00 there is a biopsy marker with interval  resolution of the previously seen mass. This is compatible with the  reported biopsy proven malignancy.    There is no definite suspicious early or abnormal enhancement seen  bilaterally.    There is a chest port in the right upper inner quadrant. There is no bulky  adenopathy.    IMPRESSION:     Left breast 3:00 biopsy marker with interval resolution of the previously  seen mass compatible with the site of reported biopsy proven malignancy.    BIRADS CATEGORY 06-KNOWN BIOPSY PROVEN MALIGNANCY  Electronically signed by: Marius Ditch M.D.  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, estrogen receptor negative     04/14/2019 -  Chemotherapy    cyclophosphamide (CYTOXAN) 1,500 mg in sodium chloride 0.9 % 360 mL chemo infusion, 1,554 mg, Intravenous, Once, 2 of 4 cycles  Administration: 1,500 mg (04/14/2019), 1,500 mg (04/28/2019)  pegfilgrastim (NEULASTA) injection 6  mg, 6 mg, Subcutaneous, Once, 2 of 2 cycles  Administration: 6 mg (04/14/2019), 6 mg (04/29/2019)  pegfilgrastim (NEULASTA) delivery kit 6 mg, 6 mg, Subcutaneous, Once, 1 of 3 cycles  Administration: 6 mg (04/28/2019)  DOXOrubicin (ADRIAMYCIN) 155 mg in sodium chloride 0.9 % 362.5 mL chemo infusion, 60 mg/m2 = 155 mg, Intravenous, Once, 2 of 4 cycles  Administration: 155 mg (04/14/2019), 155 mg (04/28/2019)     06/06/2019 -  Chemotherapy    CARBOplatin (PARAPLATIN) 224.25 mg in sodium chloride 0.9 % 272.43 mL chemo infusion, 224.25 mg, Intravenous, Once, 0 of 4 cycles  PACLitaxel (TAXOL) 210 mg in sodium chloride 0.9 % 285 mL chemo infusion, 80 mg/m2, Intravenous, Once, 0 of 4 cycles         Obstetric History  OB History   Gravida Para Term Preterm AB Living   3 3 3  0 0 2   SAB TAB Ectopic Multiple Live Births   0 0 0 0 2       Past Medical History:   Diagnosis Date    Fibroid     Herpes simplex without mention of complication 2003    possible recurrance in 2013    Hypertension     Lymphedema of extremity 11/03/2017    Neurologic disorder     brachial neuritis    Obesity     Urinary tract infection     Vitamin D deficiency    :    Past Surgical History:   Procedure Laterality Date    HYSTERECTOMY  2008    fibroids, readmitted for pelvic abscess formation POD#3    MEDIPORT CHECK Right 06/08/2019    Procedure: MEDIPORT CHECK;  Surgeon: Rex Kras, MD;  Location: [facility];  Service: Interventional Radiology;  Laterality: Right;    MEDIPORT PLACEMENT N/A 04/08/2019    Procedure: MEDIPORT PLACEMENT;  Surgeon: Pollyann Kennedy, MD;  Location: [facility];  Service: Interventional Radiology;  Laterality: N/A;    OTHER SURGICAL HISTORY  05/04/2015    Obalon balloon - have 3 balloons placed in stomach for weight loss, Dr. Georgann Housekeeper    SALPINGOOPHORECTOMY Right 2008    RSO with hysterectomy    TUBAL LIGATION  02/17/1997   :      Current Outpatient Medications:     apixaban (ELIQUIS) 5 MG, Take 1 tablet (5 mg total) by  mouth every 12 (twelve) hours, Disp: 60 tablet, Rfl: 3    Iron-Vitamin C (VITRON-C PO), Take by mouth daily, Disp: , Rfl:     losartan-hydrochlorothiazide (HYZAAR) 100-25 MG per tablet, Take 1 tablet by mouth daily, Disp: 90 tablet, Rfl: 1    ondansetron (Zofran ODT) 8 MG disintegrating tablet, Take 1 tablet (8 mg total) by mouth every 8 (eight) hours as needed for Nausea, Disp: 60 tablet, Rfl: 3    potassium chloride (MICRO-K) 10 MEQ CR capsule, Take 1  capsule (10 mEq total) by mouth 2 (two) times daily, Disp: 14 capsule, Rfl: 0    vitamin B-12 (CYANOCOBALAMIN) 1000 MCG tablet, Take 1,000 mcg by mouth daily, Disp: , Rfl:     clotrimazole (MYCELEX) 10 MG troche, Take 1 tablet (10 mg total) by mouth 3 (three) times daily, Disp: 21 tablet, Rfl: 0    dexamethasone (DECADRON) 4 MG tablet, Take 8 mg (2 tabs) on days 2, 3, and 4 for breakfast and days 3 and 4 for lunch of each chemotherapy cycle., Disp: 40 tablet, Rfl: 0    dexlansoprazole 60 MG capsule, Take 1 capsule (60 mg total) by mouth daily, Disp: 60 capsule, Rfl: 1    folic acid (FOLVITE) 1 MG tablet, Take 1 mg by mouth daily, Disp: , Rfl:     gabapentin (NEURONTIN) 100 MG capsule, Take 3 capsules (300 mg total) by mouth 3 (three) times daily, Disp: 90 capsule, Rfl: 1    lidocaine-prilocaine (EMLA) cream, Apply topically as needed (one hour prior to chemotherapy appointment), Disp: 30 g, Rfl: 3    nystatin (MYCOSTATIN) 100000 UNIT/ML suspension, Take 5 mLs (500,000 Units total) by mouth 4 (four) times daily, Disp: 60 mL, Rfl: 2    OLANZapine (ZyPREXA) 5 MG tablet, Take one tab at bedtime days 1,2,3, and 4 of each chemotherapy cycle, Disp: 30 tablet, Rfl: 0    prochlorperazine (COMPAZINE) 10 MG tablet, Take 1 tablet (10 mg total) by mouth every 6 (six) hours as needed (nausea), Disp: 30 tablet, Rfl: 3    tiZANidine (Zanaflex) 4 MG tablet, One tab every 8 hrs as needed sparingly to relax muscles, Disp: 30 tablet, Rfl: 0    Allergies:  Valsartan and  Penicillins    Family History:  Her family history includes Breast cancer in her maternal aunt and another family member; Coronary artery disease in her mother; Diabetes in her father; Heart disease in her mother; Hyperlipidemia in her mother; Hypertension in her father, mother, and sister; Other in her father; Uterine cancer in her maternal aunt; Vaginal cancer in her maternal aunt.    Social History:  Social History     Socioeconomic History    Marital status: Legally Separated     Spouse name: None    Number of children: None    Years of education: None    Highest education level: None   Occupational History    None   Tobacco Use    Smoking status: Never Smoker    Smokeless tobacco: Never Used   Haematologist Use: Never used   Substance and Sexual Activity    Alcohol use: Yes     Comment: wine - social, 2-4 times a month    Drug use: No    Sexual activity: Yes     Partners: Male   Other Topics Concern    None   Social History Narrative    Has elliptical machine        Lives with boyfriend, daughter home        Son died on 02/13/2019 in CCU            Mom staying with her, good support system 05/30/2019     Social Determinants of Health     Financial Resource Strain:     Difficulty of Paying Living Expenses:    Food Insecurity:     Worried About Programme researcher, broadcasting/film/video in the Last Year:     Barista in  the Last Year:    Transportation Needs:     Freight forwarder (Medical):     Lack of Transportation (Non-Medical):    Physical Activity: Insufficiently Active    Days of Exercise per Week: 7 days    Minutes of Exercise per Session: 20 min   Stress:     Feeling of Stress :    Social Connections:     Frequency of Communication with Friends and Family:     Frequency of Social Gatherings with Friends and Family:     Attends Religious Services:     Active Member of Clubs or Organizations:     Attends Engineer, structural:     Marital Status:    Intimate Partner Violence:      Fear of Current or Ex-Partner:     Emotionally Abused:     Physically Abused:     Sexually Abused:        Review of Systems  Review of Systems   Constitutional: Positive for fatigue.   HENT: Negative.    Eyes: Negative.    Respiratory: Negative.    Cardiovascular: Negative.    Gastrointestinal: Positive for abdominal pain.   Endocrine: Negative.    Genitourinary: Negative.    Musculoskeletal: Positive for gait problem.   Skin: Negative.    Allergic/Immunologic: Negative.    Hematological: Negative.    Psychiatric/Behavioral: Negative.        Physical Exam  BP 139/89 (BP Site: Left arm, Patient Position: Sitting)    Pulse 94    Temp 98 F (36.7 C) (Temporal)    Resp 15    Ht 1.753 m (5\' 9" )    Wt 127.5 kg (281 lb)    SpO2 97%    BMI 41.50 kg/m   WDWN female in NAD  HEENT: Eyes- Clear, no scleral icterus,      Ears/Nose- WNL Hearing-WNL      Mouth-WNL  Neck:  No thyromegaly or masses, trachea midline  Chest: Respiratory effort normal   Palpation of the chest wall: No tenderness, asymmetry, or crepitus   Lungs-Clear to ascultation   CV:  RRR without murmurs or gallops, no pedal edema  BJM:  Gait and station normal  Ext:  No CCE,   Upper Extremity Range of Motion: WNL   Skin:  Free of significant ulcers or lesions  Neuro:  Grossly intact, no focal findings, alert and oriented, asks appropriate questions  LN:  No axillary, supraclavicular or cervical adenopathy  Breast:    Symmetry:Symmetric   Ptosis grade  3     Right breast:     Presence:Present    Skin:Skin color, texture, turgor normal. No rashes or lesions.     Nipple:normal    Tissue: Within normal limits     Left breast:     Presence:Present    Skin:Skin color, texture, turgor normal. No rashes or lesions.     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 Clinically StageII (cT2N0M0) on NAC to complete tomorrow.    We have discussed the surgical options after  neoadjuvant chemotherapy both in terms of her breast and her axillary lymph nodes. In terms of her breast, we have discussed the options, benefits and risks of:   1. Mastectomy with reconstruction  2. Lumpectomy with image guided needle localization and radiation  Amongst the options for surgery, we have discussed breast conservation surgery versus  mastectomy and mastectomy with reconstruction.  Breast conservation surgery is used in combination with radiation treatment.  I have explained that in appropriately selected patients these options provide similar outcomes in terms of local-regional control and survival of breast cancer. We take into account the size of the tumor relative to the size of the breast in order to provide the best margins and the best cosmetic outcome.     Ms. Bedgood is an excellent candidate for breast conservation. She is interested in this approach.     ------ Technical aspects of surgery and reconstruction options ------    In breast conservation surgery the goal is to remove the tumor with a surrounding rim of normal tissue, usually a minimum 1-2 mm distance from the tumor to the margin of resection. If the original surgery provides a positive or a narrow margin then consideration is given to a second surgery in order to obtain negative margins. Rarely, a mastectomy may be required (in a subsequent surgery) if the initial surgical procedures do not achieve negative margins. Mild pain, ecchymosis and a gentle scar are common after lumpectomy., I anticipate a moderate volume of breast tissue will be removed with the lumpectomy and it is likely to  cause distortion of the breast size and shape. I will sculpt and realign the remaining breast tissue to restore a natural appearance to the breast shape.    1. Localization of the clip-  I will do intraoperative localization with ultrasound and then place a localizing Kopan's wire.  The risks include bleeding, infection and need for further  surgery.  Patient will need to get a follow-up mammogram to determine the exact location of the clip and to ensure that this is an ultrasound visible clip.    2. Pain control- We have discussed pain control options including the importance of a surgical bra.  The majority of patients do well with NSAIDS and tylenol based on ERAS concepts.  Will prescribe roxicodone as needed.  I have answered all her questions.     3. Oncoplastic tissue rearrangement- I anticipate a moderate volume of breast tissue will be removed with the lumpectomy and it is likely to  cause distortion of the breast size and shape.  The patient is very interested in a contralateral breast reduction which is appropriate to achieve symmetry.  The patient is going to follow-up with Dr. Sharen Heck.    4. Risks- Mild pain, ecchymosis and an incision related scare are common after lumpectomy.  Additional potential complications include but not exclusively anesthetic complications, bleeding requiring surgical evacuation, infection, fluid retention, clip retention, DVT/PE, pneumonia, and recurrence.     I explained that the current clinical management of the axillary lymph nodes after neoadjuvant chemotherapy is evolving based upon currently available data and studies.   At this time options include:  1. Axillary dissection in the situation where the patient still has palpably enlarged lymph nodes after completion of neoadjuvant chemotherapy. In addition, a full axillary lymph node dissection is considered if the goal is to potentially avoid radiation therapy in the context of a complete pathologic response to neoadjuvant chemotherapy)  2. Sentinel lymph node biopsy  (If radiation therapy will be utilized)               A. Axillary dissection only if the sentinel node remains positive               B. No axillary dissection if the sentinel node is negative   I explained  the concept of sentinel node biopsy as a technique to identify the lymph nodes most  likely to evacuate fluid and cells from the breast. , Successful identification of the sentinel lymph node occurs approximately 99% of the time.  The false-negative rate (FNR)  in the setting of post neoadjuvant chemotherapy can be up to 14% in patients with previously positive involvement.  The literature would suggest that this has minimal impact on survival.   In clinically node- negative patients, the [facility] is approximately 7%.  Factors that impact FNR include the changes in the lymphatics caused by the chemotherapy, the use of both Tech99 SC and blue dye, and the number of lymph nodes removed.     Imaging with MRI, ultrasound and PET have been shown to only be modestly helpful in identifying pre-op nodal status after neoadjuvant chemotherapy.     Sentinel lymph nodes are removed and submitted for pathologic evaluation. Potential complications from this are very rare and may include pain or numbness on the inner and posterior aspect of the arm, chronic pain and lymphedema.     Overall  At this point, I would recommend:  We will need to hold her Eliquis for approximately 72 hours prior to surgery.  Will restart her Eliquis right after surgery.  Bilateral mammogram  Left Partial segmental mastectomy with image guided needle localization with possible oncoplastic tissue rearrangement and MarginProbe assessment  A sentinel lymph node biopsy with frozen section. If the frozen section is negative, I will wait for the final pathology to make a decision regarding the need for an axillary node dissection. If the frozen section is positive, I will proceed with an axillary lymph node dissection at the same time.      Reference:  Randolm Idol al (2016) Is sentinel lymph node biopsy a viable aternative to complete axillary dissection following neoadjuvant chemotherapy in women with node positive breast cancer at diagnosis? An updated meta-analysis involving 3,398 patients.  Royann Shivers of [phone]) p. [phone].

## 2019-09-26 ENCOUNTER — Encounter: Payer: Self-pay | Admitting: Internal Medicine

## 2019-09-26 ENCOUNTER — Ambulatory Visit: Payer: Commercial Managed Care - POS | Attending: Internal Medicine | Admitting: Internal Medicine

## 2019-09-26 ENCOUNTER — Telehealth: Payer: Self-pay | Admitting: Internal Medicine

## 2019-09-26 ENCOUNTER — Other Ambulatory Visit (HOSPITAL_BASED_OUTPATIENT_CLINIC_OR_DEPARTMENT_OTHER): Payer: Self-pay

## 2019-09-26 ENCOUNTER — Encounter (HOSPITAL_BASED_OUTPATIENT_CLINIC_OR_DEPARTMENT_OTHER): Payer: Self-pay | Admitting: Surgery

## 2019-09-26 ENCOUNTER — Encounter (HOSPITAL_BASED_OUTPATIENT_CLINIC_OR_DEPARTMENT_OTHER): Payer: Self-pay

## 2019-09-26 ENCOUNTER — Ambulatory Visit (INDEPENDENT_AMBULATORY_CARE_PROVIDER_SITE_OTHER): Payer: Commercial Managed Care - POS | Admitting: Surgery

## 2019-09-26 VITALS — BP 129/85 | HR 98 | Temp 97.2°F | Resp 18 | Wt 281.8 lb

## 2019-09-26 VITALS — BP 139/89 | HR 94 | Temp 98.0°F | Resp 15 | Ht 69.0 in | Wt 281.0 lb

## 2019-09-26 DIAGNOSIS — C50412 Malignant neoplasm of upper-outer quadrant of left female breast: Secondary | ICD-10-CM

## 2019-09-26 DIAGNOSIS — I82621 Acute embolism and thrombosis of deep veins of right upper extremity: Secondary | ICD-10-CM

## 2019-09-26 DIAGNOSIS — Z171 Estrogen receptor negative status [ER-]: Secondary | ICD-10-CM

## 2019-09-26 DIAGNOSIS — C50912 Malignant neoplasm of unspecified site of left female breast: Secondary | ICD-10-CM

## 2019-09-26 DIAGNOSIS — D539 Nutritional anemia, unspecified: Secondary | ICD-10-CM

## 2019-09-26 DIAGNOSIS — Z5111 Encounter for antineoplastic chemotherapy: Secondary | ICD-10-CM

## 2019-09-26 NOTE — Progress Notes (Signed)
New order for Left preop mammo placed in Epic and scheduled for 10-06-19 at 1pm at [facility].

## 2019-09-26 NOTE — Telephone Encounter (Signed)
Front, please schedule. TY!

## 2019-09-26 NOTE — Telephone Encounter (Signed)
3:40 pm. TY!

## 2019-09-26 NOTE — Telephone Encounter (Signed)
Patient states that she cannot do that time. Patient is wondering if there is another slot that she can take. Thank you

## 2019-09-26 NOTE — Telephone Encounter (Signed)
RTC 9/13 at 840 am

## 2019-09-26 NOTE — Progress Notes (Signed)
[facility]  [address] [name]  [phone]      [name] Office  [phone]  Visit Date: 09/26/2019    Chief Complaint   Patient presents with    Follow-up     Continues managed care for breast cancer with no pain at this time.         HISTORY OF PRESENT ILLNESS:  Still very tired. Developed swollen eyes after starting gabpentin which eased, but did not resolved neuropathic pain.     Oncology History Overview Note   Physician Requesting Consult: de Lawson Radar, Dairl Ponder, MD    Breast Surgeon: Orlene Och, MD  Radiation Oncologist:  Plastic Surgeon:   PCP: Satira Mccallum, MD      Leslie [name] is a 49 y.o.-year-old female with newly diagnosed IDC of the left breast.   She presented with abnormal mammogram.     Bilateral screening mammogram 02/2019 showed left breast 3.1 x 2 cm hypoechoic mass and midly enlarged axillary node abnormality.     CNB left breast mass 2 OC and left axillary node 03/07/2019 [facility]) showed:   A) Breast: IDC, G3, ER 0%, PR 0%, Her 2 neu negative (1+) by IHC, Ki 67 80%  B) B) Lymph node, left axillary: benign-appearing lymph node.    Bilateral breast MRI and Genetic consult ordered. Path review pending.       Breast Cancer Risk Factors:  Age at Menarche:  11  Age at 22st FTP:  44    Still menstruating?:  No   Last menses: 2008   Hysterectomy: Yes 2008 TAH+ left oophorectomy   Estrogen Therapy:          Oral Contraceptives: No           Hormone Replacement Therapy:  No         Reproductive Assistance Therapies:  No  Family History of Cancer:            Breast:   Yes maternal aunt age 76, maternal cousin age 16, paternal cousin age 16,       Ashkenazi Jewish Ancestry: No     Genetic testing: pending       Denies F/C/N/V/diarrhea. No CP/SOB. No HA/visual changes/focal weakness.        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, estrogen receptor negative   03/29/2019 Initial Diagnosis    Malignant neoplasm of upper outer quadrant of  breast in female, estrogen receptor negative     04/14/2019 - 06/22/2019 Chemotherapy    cyclophosphamide (CYTOXAN) 1,500 mg in sodium chloride 0.9 % 360 mL chemo infusion, 1,554 mg, Intravenous, Once, 4 of 4 cycles  Administration: 1,500 mg (04/14/2019), 1,500 mg (04/28/2019), 1,500 mg (05/12/2019), 1,500 mg (06/09/2019)  pegfilgrastim (NEULASTA) injection 6 mg, 6 mg, Subcutaneous, Once, 2 of 2 cycles  Administration: 6 mg (04/14/2019), 6 mg (04/29/2019)  pegfilgrastim (NEULASTA) delivery kit 6 mg, 6 mg, Subcutaneous, Once, 3 of 3 cycles  Administration: 6 mg (04/28/2019), 6 mg (05/12/2019), 6 mg (06/09/2019)  DOXOrubicin (ADRIAMYCIN) 155 mg in sodium chloride 0.9 % 362.5 mL chemo infusion, 60 mg/m2 = 155 mg, Intravenous, Once, 4 of 4 cycles  Administration: 155 mg (04/14/2019), 155 mg (04/28/2019), 155 mg (05/12/2019), 155 mg (06/09/2019)     06/23/2019 -  Chemotherapy    CARBOplatin (PARAPLATIN) 225 mg in sodium chloride 0.9 % 307.5 mL chemo infusion, 225 mg, Intravenous, Once, 2 of 2 cycles  Administration: 225 mg (  06/23/2019), 225 mg (06/30/2019), 225 mg (07/07/2019), 225 mg (07/14/2019)  PACLitaxel (TAXOL) 210 mg in sodium chloride 0.9 % 320 mL chemo infusion, 80 mg/m2 = 210 mg, Intravenous, Once, 4 of 4 cycles  Dose modification: 64 mg/m2 (80 % of original dose 80 mg/m2, Cycle 4, Reason: Dose Not Tolerated)  Administration: 210 mg (06/23/2019), 210 mg (06/30/2019), 210 mg (07/07/2019), 210 mg (07/14/2019), 210 mg (07/28/2019), 210 mg (08/04/2019), 210 mg (08/11/2019), 210 mg (08/18/2019), 210 mg (08/25/2019), 210 mg (09/01/2019), 168 mg (09/15/2019)       Cancer Staging  No matching staging information was found for the patient.    Past Medical History:   Diagnosis Date    Fibroid     Herpes simplex without mention of complication 2003    possible recurrance in 2013    Hypertension     Lymphedema of extremity 11/03/2017    Neurologic disorder     brachial neuritis    Obesity     Urinary tract infection     Vitamin D deficiency      Past  Surgical History:   Procedure Laterality Date    HYSTERECTOMY  2008    fibroids, readmitted for pelvic abscess formation POD#3    MEDIPORT CHECK Right 06/08/2019    Procedure: MEDIPORT CHECK;  Surgeon: Rex Kras, MD;  Location: [facility];  Service: Interventional Radiology;  Laterality: Right;    MEDIPORT PLACEMENT N/A 04/08/2019    Procedure: MEDIPORT PLACEMENT;  Surgeon: Pollyann Kennedy, MD;  Location: [facility];  Service: Interventional Radiology;  Laterality: N/A;    OTHER SURGICAL HISTORY  05/04/2015    Obalon balloon - have 3 balloons placed in stomach for weight loss, Dr. Georgann Housekeeper    SALPINGOOPHORECTOMY Right 2008    RSO with hysterectomy    TUBAL LIGATION  02/17/1997     Social History     Socioeconomic History    Marital status: Legally Separated     Spouse name: None    Number of children: None    Years of education: None    Highest education level: None   Occupational History    None   Tobacco Use    Smoking status: Never Smoker    Smokeless tobacco: Never Used   Vaping Use    Vaping Use: Never used   Substance and Sexual Activity    Alcohol use: Yes     Comment: wine - social, 2-4 times a month    Drug use: No    Sexual activity: Yes     Partners: Male   Other Topics Concern    None   Social History Narrative    Has elliptical machine        Lives with boyfriend, daughter home        Son died on 02-06-2019 in CCU            Mom staying with her, good support system 05/30/2019     Social Determinants of Health     Financial Resource Strain:     Difficulty of Paying Living Expenses:    Food Insecurity:     Worried About Programme researcher, broadcasting/film/video in the Last Year:     Barista in the Last Year:    Transportation Needs:     Freight forwarder (Medical):     Lack of Transportation (Non-Medical):    Physical Activity: Insufficiently Active    Days of Exercise per Week: 7 days  Minutes of Exercise per Session: 20 min   Stress:     Feeling of Stress :    Social Connections:      Frequency of Communication with Friends and Family:     Frequency of Social Gatherings with Friends and Family:     Attends Religious Services:     Active Member of Clubs or Organizations:     Attends Engineer, structural:     Marital Status:    Intimate Partner Violence:     Fear of Current or Ex-Partner:     Emotionally Abused:     Physically Abused:     Sexually Abused:       Family History   Problem Relation Age of Onset    Hypertension Mother     Coronary artery disease Mother     Heart disease Mother     Hyperlipidemia Mother     Hypertension Father     Diabetes Father     Other Father         cardiac catheterization    Hypertension Sister     Breast cancer Maternal Aunt     Uterine cancer Maternal Aunt     Vaginal cancer Maternal Aunt     Breast cancer Other         maternal cousin    Ovarian cancer Neg Hx        Allergies   Allergen Reactions    Valsartan Other (See Comments)     achiness    Penicillins Hives     Symptoms occurred as child  Onset date: 07-28-2004     has a current medication list which includes the following prescription(s): apixaban, gabapentin, iron-vitamin c, losartan-hydrochlorothiazide, olanzapine, ondansetron, potassium chloride, prochlorperazine, vitamin b-12, clotrimazole, dexamethasone, dexlansoprazole, folic acid, lidocaine-prilocaine, nystatin, and tizanidine.        REVIEW OF SYSTEMS: 14 pt ROS reviewed with the patient and negative except for as documented in the HPI. All other systems reviewed.      PHYSICAL EXAM:  BP 129/85 (BP Site: Right arm, Patient Position: Sitting, Cuff Size: Large)    Pulse 98    Temp 97.2 F (36.2 C) (Oral)    Resp 18    Wt 127.8 kg (281 lb 12.8 oz)    SpO2 100%    BMI 41.61 kg/m     Performance Status:  2 - Ambulatory and capable of all selfcare but unable to carry out any work activities.  Up and about more than 50% of waking hours         General Appearance: Alert, cooperative, no distress, appears stated  age  Eyes: periorbital swelling   Neck: Supple, symmetrical, trachea midline, no adenopathy;   thyroid:  no enlargement/tenderness/nodules  Lungs: Clear to auscultation bilaterally, respirations unlabored  Heart: Regular rate and rhythm, S1 and S2 normal, no murmur, rub or gallop  Breast Exam: Normal breast exam  Abdomen: Soft, non-tender, no masses, no organomegaly  Extremities: Extremities normal, atraumatic, no cyanosis or edema  Lymph Nodes:  Cervical, supraclavicular, and axillary nodes normal      Labs Results:  Reviewed in epic today  Lab Results   Component Value Date    WBC 6.84 09/15/2019    HGB 10.2 (L) 09/15/2019    HCT 31.2 (L) 09/15/2019    MCV 95.1 09/15/2019    PLT 192 09/15/2019     Lab Results   Component Value Date    CREAT 1.2 09/15/2019    BUN 13.0  09/15/2019    NA 134 (L) 09/15/2019    K 4.1 09/15/2019    CL 106 09/15/2019    CO2 30 (H) 09/15/2019     Lab Results   Component Value Date    ALT 30 09/15/2019    AST 30 09/15/2019    ALKPHOS 82 08/18/2019    BILITOTAL 0.3 08/18/2019       Imaging Results: MRI breast 09/12/2019     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    C1D1 AC on 04/14/2019.   C4D1 AC on 06/09/2019.   Started carbotaxol on 06/23/2019. Was unable to tolerate.   Dropped carbo and continued with single agent paclitaxel starting with C2D8. Patient decided to stop chemotherapy with once one infusion left (09/15/2019) as she developed severe pain.     Followed by cardio-oncology.  BaselineECHO2/23/2021 showed EF  66% and GLS -19%.  ECHOpost C#2 AC 3/17/2021showed EF 64% and GLS -19%.  Echo post C4 AC- 4/19/ 21 showed EF 64% GLS -19. %  6 m post AC ECHO due 11/2019.     Left breast sonogram 5/17/2021showed reduction in size left breast mass to 1.1 x 0.4 x 0.4 cm.  MRI breast 09/12/2019 showed: left breast 3:00 biopsy marker with interval resolution of the previously seen mass compatible with the site of reported biopsy proven malignancy.    She will meed with Dr Cyndi Bender today. Surgery on 10/14/2019 (?)    RTC 10/31/2019 for path review     CIPN:   Stop gabapentin. Offered duloxetine but patient would prefer taking advil has it works some before.     Fatigue: low normal Vitamin B12 ( started 1000 mcg sublingual) , iron stat low ( started Vitron-C once a day).   Will repeat B12 and iron level in a couple of months.      CVL associated DVT   Diagnosed on 05/26/2019   On Eliquis. Continue.   Keep PORT and continue eliquis until port in place.   No need to bridge for surgery.     [name], MD

## 2019-09-28 ENCOUNTER — Other Ambulatory Visit (HOSPITAL_BASED_OUTPATIENT_CLINIC_OR_DEPARTMENT_OTHER): Payer: Self-pay | Admitting: Surgery

## 2019-09-28 ENCOUNTER — Encounter (HOSPITAL_BASED_OUTPATIENT_CLINIC_OR_DEPARTMENT_OTHER): Payer: Self-pay | Admitting: Surgery

## 2019-09-28 ENCOUNTER — Other Ambulatory Visit (HOSPITAL_BASED_OUTPATIENT_CLINIC_OR_DEPARTMENT_OTHER): Payer: Self-pay

## 2019-09-28 ENCOUNTER — Encounter: Payer: Self-pay | Admitting: Internal Medicine

## 2019-09-28 DIAGNOSIS — C50912 Malignant neoplasm of unspecified site of left female breast: Secondary | ICD-10-CM

## 2019-09-29 ENCOUNTER — Ambulatory Visit (HOSPITAL_BASED_OUTPATIENT_CLINIC_OR_DEPARTMENT_OTHER): Payer: Commercial Managed Care - POS | Admitting: Surgery

## 2019-10-04 ENCOUNTER — Encounter: Payer: Self-pay | Admitting: Surgery

## 2019-10-04 ENCOUNTER — Ambulatory Visit: Payer: Commercial Managed Care - POS | Attending: Surgery

## 2019-10-04 NOTE — Pre-Procedure Instructions (Signed)
Surgical Risk Level : (Low, Intermediate, High)  o intermediate     Surgeon Testing Requirements:  o none     Anesthesia Guideline Requirements:  o Chemo information/in chart     Specialist Notes / Test Results / Records Requested:  o Oncology infor in chart     Recent Hospitalization / ED Visit:   o n/a     Future Plan / Upcoming Appts:   o n/a     Labs/Testing @ [facility]:   o N./a     Email Sent To Patient:   Surgical brochure and hibiclens instruction to [email or web address] sent   NPO Instructions given to patient:    o NPO instructions reviewed: Clear liquids up to 2 hours prior to arrival time, then NPO. No solid food 8 hours prior to scheduled procedure time. Examples of clear liquids include water, apple juice, sports drinks such as Gatorade, coffee or tea without milk or cream. Sugar or sweetener may be added, pt voiced understanding  o Fasting Requirements per Preoperative Fasting Guidelines for Elective Surgeries and Procedures Requiring Anesthesia Policy Revised 06/2019.  Ingested material Fasting requirement   Clear liquids/Ice Chips 2 hours prior to arrival time   Breast milk 4 hours prior to scheduled procedure time   Infant formula 6 hours prior to scheduled procedure time   Non-human milk 8 hours prior to scheduled procedure time   Solid food 8 hours prior to scheduled procedure time     o      Faxes Sent To:   o Pharmacy- DOS medication orders        Epic Orders Entered:   o Preop Nursing Anesthesia Orders  o PIV LR  o SCD     Other Outlying information gathered that does not fit anywhere else  o Oncology chemo info in chart     Chart Room Handoff for Further  Follow-up if Applicable:  o anes review    Visitor Restriction Guidelines per [facility] as of 09/05/19:  All visitors must adhere to the following:    Exhibit no COVID-19 symptoms    Age 38+ (internally exceptions can be made by administrative team as needed, e.g., siblings, end of life situations, etc.)    In  keeping with the CDCs current guidance, regardless of vaccination status, everyone in a healthcare facility must wear a mask covering their mouth and nose the entire time they are in the facility. Visitors who fail to wear a mask properly will be asked to leave.    The following face coverings cannot be worn at any [place] location: gaiter style masks, bandanas or vented masks.    No visitors are allowed for patients with suspected or confirmed COVID-19, except in end-of-life situations.  Hospital Inpatient:   Visitation hours: 9 a.m. - 6:30 p.m. daily    Adult patients may have two visitors, in addition to a [employer] Person (DSP), if applicable    Pediatric patients may have two parents/guardians at bedside 24/7. For pediatric outpatient areas, only parents/guardians may visit  Outpatient/Ambulatory Surgery:    Adult patients may have one visitor, in addition to their Designated Support Person (DSP) on the day of surgery.    No family members will be allowed into Phase 1 recovery areas. Physicians will call contact person to give report of procedure.    Family / visitor will be called by PACU staff  to review discharge instructions via phone and answer any questions.     Advise  to call surgeon if need to cancel surgery arises.      Patient verbalized understanding and acceptance of above information.

## 2019-10-05 ENCOUNTER — Encounter: Payer: Self-pay | Admitting: Internal Medicine

## 2019-10-12 ENCOUNTER — Telehealth (HOSPITAL_BASED_OUTPATIENT_CLINIC_OR_DEPARTMENT_OTHER): Payer: Self-pay

## 2019-10-12 NOTE — Telephone Encounter (Signed)
See phone note

## 2019-10-13 ENCOUNTER — Other Ambulatory Visit (HOSPITAL_BASED_OUTPATIENT_CLINIC_OR_DEPARTMENT_OTHER): Payer: Self-pay | Admitting: Surgery

## 2019-10-13 ENCOUNTER — Other Ambulatory Visit (INDEPENDENT_AMBULATORY_CARE_PROVIDER_SITE_OTHER): Payer: Self-pay | Admitting: Surgery

## 2019-10-13 ENCOUNTER — Telehealth (HOSPITAL_BASED_OUTPATIENT_CLINIC_OR_DEPARTMENT_OTHER): Payer: Self-pay

## 2019-10-13 DIAGNOSIS — C50912 Malignant neoplasm of unspecified site of left female breast: Secondary | ICD-10-CM

## 2019-10-13 NOTE — Pre-Procedure Instructions (Signed)
Day Before Surgery Confirmation Call    Left Message:    Confirmed surgery date, arrival time, and location.   Arrival: 0600  Surgery: 0800    NPO instructions reviewed: Clear liquids up to 2 hours prior to arrival time, then NPO. No solid food 8 hours prior to scheduled procedure time. Examples of clear liquids include water, apple juice, sports drinks such as Gatorade, coffee or tea without milk or cream. Sugar or sweetener may be added    Fasting Requirements per Preoperative Fasting Guidelines for Elective Surgeries and Procedures Requiring Anesthesia Policy Revised 06/2019  Ingested material Fasting requirement   Clear liquids/Ice Chips 2 hours prior to arrival time   Breast milk 4 hours prior to scheduled procedure time   Infant formula 6 hours prior to scheduled procedure time   Non-human milk 8 hours prior to scheduled procedure time   Solid food 8 hours prior to scheduled procedure time       Ambulatory Screening Tool:   Patient denies themselves or anyone in immediate family currently experiencing fever or any symptoms of acute respiratory illness (cough, or shortness of breath).  Patient denies travel outside of the [place] in the last month.   Patient denies being in close contact with a confirmed COVID19 patient.   Patient does not reside in a nursing home or long-term care facility.   Patient denies recent visit to ED, hospitalization or PCP visit for acute illness since P[facility] RN interview.     Patient instructed that if you or your family does develop new symptoms, to contact surgeons office immediately, prior to arrival at the hospital.     Visitor Restriction Guidelines per [facility] Policy as of 09/05/19:    All visitors must adhere to the following:   • Exhibit no COVID-19 symptoms   • Age 16+ (internally exceptions can be made by administrative team as needed, e.g., siblings, end of life situations, etc.)   • In keeping with the CDC’s current guidance, regardless of vaccination status, everyone in a  healthcare facility must wear a mask covering their mouth and nose the entire time they are in the facility. Visitors who fail to wear a mask properly will be asked to leave.   • The following face coverings cannot be worn at any [place] location: gaiter style masks, bandanas or vented masks.   • No visitors are allowed for patients with suspected or confirmed COVID-19, except in end-of-life situations.    Hospital Inpatient:   Visitation hours: 9 a.m. - 6:30 p.m. daily   • Adult patients may have two visitors, in addition to a Designated Support Person (DSP), if applicable   • Pediatric patients may have two parents/guardians at bedside 24/7. For pediatric outpatient areas, only parents/guardians may visit  Outpatient/Ambulatory Surgery:   • Adult patients may have one visitor, in addition to their Designated Support Person (DSP) on the day of surgery.   • No family members will be allowed into Phase 1 recovery areas. Physicians will call contact person to give report of procedure.   • Family / visitor will be called by PACU staff  to review discharge instructions via phone and answer any questions.       Advise to call surgeon if need to cancel surgery arises.     Patient verbalized understanding and acceptance of above information.  If a voicemail is left for the patient and any of the COVID19 ambulatory screening questions are positive, patient is asked to call PSS   ASAP.

## 2019-10-13 NOTE — Telephone Encounter (Signed)
See phone note

## 2019-10-13 NOTE — Anesthesia Preprocedure Evaluation (Addendum)
Anesthesia Evaluation    AIRWAY    Mallampati: II    TM distance: >3 FB  Neck ROM: full  Mouth Opening:full  Planned to use difficult airway equipment: No CARDIOVASCULAR    cardiovascular exam normal       DENTAL    no notable dental hx     PULMONARY    pulmonary exam normal     OTHER FINDINGS                ADDITIONAL MEDICAL HISTORY      No LMP recorded. Patient has had a hysterectomy.    Body mass index is 40.4 kg/m.    Stop-Bang Score: 3    Date of last liquid: 10/14/19  Time of last liquid: 0400 (sips with meds)   Date of last solid: 10/13/19  Time of last solid: 2300       Scheduled  Procedure(s) with comments:  BIOPSY, BREAST, TUMOR EXCISION, ULTRASOUND NEEDLE LOCALIZATION - left breast ultrasound-guided wire placement in OR by MD + port removal  asst=y  BIOPSY, SENTINEL NODE  REMOVAL, MEDIPORT  MAMMOPLASTY/ONCOPLASTY - left oncoplastic reduction, right breast reduction for symmetry  asst=y  MAMMOPLASTY, REDUCTION (MEDICAL)  Attending Physician: Boykin Peek, MD  Admission Date:10/14/2019      Assessment:      Patient Active Problem List   Diagnosis    Lymphedema of extremity    Morbid obesity    Cervical radiculopathy    Essential hypertension    Vitamin D deficiency    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, estrogen receptor negative    Encounter for antineoplastic chemotherapy    High risk for chemotherapy-induced infectious complication    Acute deep vein thrombosis (DVT) of right upper extremity, unspecified vein    Dehydration    Nutritional anemia    Gastroesophageal reflux disease with esophagitis without hemorrhage          Past Medical History:   Diagnosis Date    Anemia     iron deficient, recent chemo//takes po iron supplement    Clot     mediport site r chest, on elliquis    Fibroid     Gastroesophageal reflux disease     indigestion while on chemo    Herpes simplex without mention of complication 2003    possible recurrance in 2013//no issuses  since    Hypertension     well controlled w/ medication    Lymphedema of extremity 11/03/2017    L foot    Malignant neoplasm of breast 02/2019    L breast ca, s/p chemo 08/2019, no radiation    Neurologic disorder     brachial neuritis    Neuropathy     recednt chemo/hands/feet    Obesity     Vitamin D deficiency             Past Surgical History:   Procedure Laterality Date    HYSTERECTOMY  2008    fibroids, readmitted for pelvic abscess formation POD#3    MEDIPORT CHECK Right 06/08/2019    Procedure: MEDIPORT CHECK;  Surgeon: Rex Kras, MD;  Location: [facility];  Service: Interventional Radiology;  Laterality: Right;    MEDIPORT PLACEMENT N/A 04/08/2019    Procedure: MEDIPORT PLACEMENT;  Surgeon: Pollyann Kennedy, MD;  Location: [facility];  Service: Interventional Radiology;  Laterality: N/A;    OTHER SURGICAL HISTORY  05/04/2015    Obalon balloon - have 3 balloons placed in stomach  for weight loss, Dr. Georgann Housekeeper    SALPINGOOPHORECTOMY Right 2008    RSO with hysterectomy    TUBAL LIGATION  02/17/1997       Social History     Social History     Tobacco Use    Smoking status: Never Smoker    Smokeless tobacco: Never Used   Substance Use Topics    Alcohol use: Not Currently      Allergies:     Allergies   Allergen Reactions    Valsartan Other (See Comments)     achiness    Penicillins Hives     Symptoms occurred as child  Onset date: 07-28-2004     Hospital Medications:     Current Facility-Administered Medications   Medication Dose Route Frequency    clindamycin  900 mg Intravenous On Call to OR     Home Medications:     Medications Prior to Admission   Medication Sig Dispense Refill Last Dose    apixaban (ELIQUIS) 5 MG Take 1 tablet (5 mg total) by mouth every 12 (twelve) hours 60 tablet 3 10/10/2019 at am    ibuprofen (ADVIL) 200 MG tablet Take 400 mg by mouth daily   09/30/2019    Iron-Vitamin C (VITRON-C PO) Take by mouth daily   Past Week at am    losartan-hydrochlorothiazide (HYZAAR) 100-25 MG  per tablet Take 1 tablet by mouth daily 90 tablet 1 10/14/2019 at am    Omeprazole (PRILOSEC PO) Take by mouth every morning   10/14/2019 at am    potassium chloride (MICRO-K) 10 MEQ CR capsule Take 1 capsule (10 mEq total) by mouth 2 (two) times daily 14 capsule 0 10/11/2019 at am    traMADol (ULTRAM) 50 MG tablet Take 50 mg by mouth every 6 (six) hours as needed for Pain   10/08/2019 at am    clindamycin (CLEOCIN) 300 MG capsule    Not Taking    clotrimazole (MYCELEX) 10 MG troche Take 1 tablet (10 mg total) by mouth 3 (three) times daily 21 tablet 0 Not Taking    dexamethasone (DECADRON) 4 MG tablet Take 8 mg (2 tabs) on days 2, 3, and 4 for breakfast and days 3 and 4 for lunch of each chemotherapy cycle. 40 tablet 0 Not Taking    dexlansoprazole 60 MG capsule Take 1 capsule (60 mg total) by mouth daily 60 capsule 1 Not Taking    folic acid (FOLVITE) 1 MG tablet Take 1 mg by mouth daily   Not Taking    gabapentin (NEURONTIN) 100 MG capsule Take 3 capsules (300 mg total) by mouth 3 (three) times daily 90 capsule 1 Not Taking    lidocaine-prilocaine (EMLA) cream Apply topically as needed (one hour prior to chemotherapy appointment) 30 g 3 Not Taking    nystatin (MYCOSTATIN) 100000 UNIT/ML suspension Take 5 mLs (500,000 Units total) by mouth 4 (four) times daily 60 mL 2 Not Taking    OLANZapine (ZyPREXA) 5 MG tablet Take one tab at bedtime days 1,2,3, and 4 of each chemotherapy cycle 30 tablet 0 Not Taking    ondansetron (Zofran ODT) 8 MG disintegrating tablet Take 1 tablet (8 mg total) by mouth every 8 (eight) hours as needed for Nausea 60 tablet 3 Not Taking    oxyCODONE (ROXICODONE) 5 MG immediate release tablet    Not Taking    oxyCODONE-acetaminophen (PERCOCET) 5-325 MG per tablet    Not Taking    prochlorperazine (COMPAZINE) 10 MG tablet Take  1 tablet (10 mg total) by mouth every 6 (six) hours as needed (nausea) 30 tablet 3 not taking    tiZANidine (Zanaflex) 4 MG tablet One tab every 8 hrs as  needed sparingly to relax muscles 30 tablet 0 Not Taking    vitamin B-12 (CYANOCOBALAMIN) 1000 MCG tablet Take 1,000 mcg by mouth daily   Not Taking           Vitals:     Vitals:    10/14/19 0624   BP: 139/88   Pulse: (!) 115   Resp: 20   Temp: (!) 35.8 C (96.4 F)   SpO2: 100%       Labs:     Results     Procedure Component Value Units Date/Time    Glucose Whole Blood - POCT [[id number]]  (Abnormal) Collected: 10/14/19 0644     Updated: 10/14/19 0704     Whole Blood Glucose POCT 127 mg/dL                Lab Results   Component Value Date    HGB 10.2 (L) 09/15/2019    PLT 192 09/15/2019       Lab Results   Component Value Date    NA 134 (L) 09/15/2019    CL 106 09/15/2019    CO2 30 (H) 09/15/2019    K 4.1 09/15/2019    CREAT 1.2 09/15/2019    BUN 13.0 09/15/2019    GLU 120 (H) 09/15/2019       Lab Results   Component Value Date    AST 30 09/15/2019    ALT 30 09/15/2019    TROPI <0.01 03/29/2019       Lab Results   Component Value Date    PT 18.4 (H) 03/29/2019    PTT 44 (H) 03/29/2019    INR 1.5 (H) 03/29/2019       Lab Results   Component Value Date    COVID Nasopharyngeal 06/08/2019    COVID Negative 06/08/2019       Rads:     Radiology Results (24 Hour)     ** No results found for the last 24 hours. **                    Relevant Problems   CARDIO   (+) Acute deep vein thrombosis (DVT) of right upper extremity, unspecified vein   (+) Essential hypertension      GI   (+) Gastroesophageal reflux disease with esophagitis without hemorrhage       PSS Anesthesia Comments: BMI- 41.35, HTN, GERD, Breast cancer s/p chemo 08/2019, DVT Mediport site right chest- taking Eliquis; Labs- Hgb/Hct- 10.2/31.2, Na+- 134; Echo(05/04/2019)EF-64 %, mild LVH with normal valves.LS        Anesthesia Plan    ASA 3     general                     Detailed anesthesia plan: general endotracheal      Post Op: plan for postoperative opioid use    Post op pain management: PO analgesics    informed consent obtained      pertinent labs  reviewed             Signed by: Helaine Chess 10/13/19 2:17 PM

## 2019-10-14 ENCOUNTER — Ambulatory Visit: Payer: Commercial Managed Care - POS | Admitting: Acute Care

## 2019-10-14 ENCOUNTER — Encounter
Admission: RE | Disposition: A | Discharge status: Home / Self Care | Payer: Self-pay | Source: Ambulatory Visit | Attending: Surgery

## 2019-10-14 ENCOUNTER — Ambulatory Visit
Admission: RE | Admit: 2019-10-14 | Discharge: 2019-10-14 | Disposition: A | Discharge status: Home / Self Care | Payer: Commercial Managed Care - POS | Source: Ambulatory Visit | Attending: Surgery | Admitting: Surgery

## 2019-10-14 ENCOUNTER — Ambulatory Visit: Payer: Self-pay

## 2019-10-14 ENCOUNTER — Ambulatory Visit: Payer: Commercial Managed Care - POS

## 2019-10-14 DIAGNOSIS — N62 Hypertrophy of breast: Secondary | ICD-10-CM | POA: Insufficient documentation

## 2019-10-14 DIAGNOSIS — C50412 Malignant neoplasm of upper-outer quadrant of left female breast: Secondary | ICD-10-CM

## 2019-10-14 DIAGNOSIS — N650 Deformity of reconstructed breast: Secondary | ICD-10-CM | POA: Insufficient documentation

## 2019-10-14 DIAGNOSIS — E669 Obesity, unspecified: Secondary | ICD-10-CM | POA: Insufficient documentation

## 2019-10-14 DIAGNOSIS — Z6841 Body Mass Index (BMI) 40.0 and over, adult: Secondary | ICD-10-CM | POA: Insufficient documentation

## 2019-10-14 DIAGNOSIS — Z803 Family history of malignant neoplasm of breast: Secondary | ICD-10-CM | POA: Insufficient documentation

## 2019-10-14 DIAGNOSIS — Z7901 Long term (current) use of anticoagulants: Secondary | ICD-10-CM | POA: Insufficient documentation

## 2019-10-14 DIAGNOSIS — I890 Lymphedema, not elsewhere classified: Secondary | ICD-10-CM | POA: Insufficient documentation

## 2019-10-14 DIAGNOSIS — Z171 Estrogen receptor negative status [ER-]: Secondary | ICD-10-CM | POA: Insufficient documentation

## 2019-10-14 DIAGNOSIS — C50419 Malignant neoplasm of upper-outer quadrant of unspecified female breast: Secondary | ICD-10-CM

## 2019-10-14 DIAGNOSIS — I10 Essential (primary) hypertension: Secondary | ICD-10-CM | POA: Insufficient documentation

## 2019-10-14 DIAGNOSIS — C50912 Malignant neoplasm of unspecified site of left female breast: Secondary | ICD-10-CM

## 2019-10-14 DIAGNOSIS — C50512 Malignant neoplasm of lower-outer quadrant of left female breast: Secondary | ICD-10-CM | POA: Insufficient documentation

## 2019-10-14 DIAGNOSIS — Z9221 Personal history of antineoplastic chemotherapy: Secondary | ICD-10-CM | POA: Insufficient documentation

## 2019-10-14 DIAGNOSIS — Z86718 Personal history of other venous thrombosis and embolism: Secondary | ICD-10-CM | POA: Insufficient documentation

## 2019-10-14 DIAGNOSIS — Z452 Encounter for adjustment and management of vascular access device: Secondary | ICD-10-CM | POA: Insufficient documentation

## 2019-10-14 DIAGNOSIS — Z86711 Personal history of pulmonary embolism: Secondary | ICD-10-CM | POA: Insufficient documentation

## 2019-10-14 DIAGNOSIS — Z7952 Long term (current) use of systemic steroids: Secondary | ICD-10-CM | POA: Insufficient documentation

## 2019-10-14 HISTORY — PX: MAMMOPLASTY, REDUCTION (MEDICAL): SHX4775

## 2019-10-14 HISTORY — PX: BIOPSY, SENTINEL NODE: SHX3265

## 2019-10-14 HISTORY — PX: BREAST, LUMPECTOMY: SHX3314

## 2019-10-14 HISTORY — DX: Gastro-esophageal reflux disease without esophagitis: K21.9

## 2019-10-14 HISTORY — DX: Polyneuropathy, unspecified: G62.9

## 2019-10-14 HISTORY — DX: Anemia, unspecified: D64.9

## 2019-10-14 HISTORY — PX: BREAST SURGERY: SHX581

## 2019-10-14 HISTORY — PX: REMOVAL, MEDIPORT: SHX5139

## 2019-10-14 HISTORY — PX: MAMMOPLASTY/ONCOPLASTY: SHX4782

## 2019-10-14 HISTORY — DX: Acute embolism and thrombosis of unspecified vein: I82.90

## 2019-10-14 LAB — GLUCOSE WHOLE BLOOD - POCT: Whole Blood Glucose POCT: 127 mg/dL — ABNORMAL HIGH (ref 70–100)

## 2019-10-14 SURGERY — BIOPSY, SENTINEL NODE
Anesthesia: Anesthesia General | Site: Chest | Laterality: Right | Wound class: Clean

## 2019-10-14 MED ORDER — LIDOCAINE-EPINEPHRINE 1 %-1:100000 IJ SOLN
INTRAMUSCULAR | Status: DC | PRN
Start: 2019-10-14 — End: 2019-10-14
  Administered 2019-10-14: 09:00:00 12 mL via INTRAMUSCULAR

## 2019-10-14 MED ORDER — SODIUM CHLORIDE (PF) 0.9 % IJ SOLN
INTRAMUSCULAR | Status: AC
Start: 2019-10-14 — End: ?
  Filled 2019-10-14: qty 10

## 2019-10-14 MED ORDER — OXYCODONE-ACETAMINOPHEN 5-325 MG PO TABS
1.0000 | ORAL_TABLET | Freq: Once | ORAL | Status: DC | PRN
Start: 2019-10-14 — End: 2019-10-14

## 2019-10-14 MED ORDER — ACETAMINOPHEN 500 MG PO TABS
ORAL_TABLET | ORAL | Status: AC
Start: 2019-10-14 — End: ?
  Filled 2019-10-14: qty 2

## 2019-10-14 MED ORDER — PROPOFOL 10 MG/ML IV EMUL (WRAP)
INTRAVENOUS | Status: AC
Start: 2019-10-14 — End: ?
  Filled 2019-10-14: qty 20

## 2019-10-14 MED ORDER — ONDANSETRON HCL 4 MG/2ML IJ SOLN
4.0000 mg | Freq: Once | INTRAMUSCULAR | Status: AC | PRN
Start: 2019-10-14 — End: 2019-10-14
  Administered 2019-10-14: 15:00:00 4 mg via INTRAVENOUS
  Filled 2019-10-14: qty 2

## 2019-10-14 MED ORDER — PROPOFOL 10 MG/ML IV EMUL (WRAP)
INTRAVENOUS | Status: AC
Start: 2019-10-14 — End: ?
  Filled 2019-10-14: qty 40

## 2019-10-14 MED ORDER — MICROFIBRILLAR COLL HEMOSTAT EX POWD
CUTANEOUS | Status: AC
Start: 2019-10-14 — End: ?
  Filled 2019-10-14: qty 1

## 2019-10-14 MED ORDER — MICROFIBRILLAR COLL HEMOSTAT EX POWD
CUTANEOUS | Status: DC | PRN
Start: 2019-10-14 — End: 2019-10-14
  Administered 2019-10-14: 1 g via TOPICAL

## 2019-10-14 MED ORDER — DEXAMETHASONE SODIUM PHOSPHATE 4 MG/ML IJ SOLN (WRAP)
INTRAMUSCULAR | Status: DC | PRN
Start: 2019-10-14 — End: 2019-10-14
  Administered 2019-10-14: 4 mg via INTRAVENOUS

## 2019-10-14 MED ORDER — PHENYLEPHRINE 100 MCG/ML IV BOLUS (ANESTHESIA)
PREFILLED_SYRINGE | INTRAVENOUS | Status: DC | PRN
Start: 2019-10-14 — End: 2019-10-14
  Administered 2019-10-14 (×7): 100 ug via INTRAVENOUS

## 2019-10-14 MED ORDER — PROPOFOL 10 MG/ML IV EMUL (WRAP)
INTRAVENOUS | Status: DC | PRN
Start: 2019-10-14 — End: 2019-10-14
  Administered 2019-10-14: 100 mg via INTRAVENOUS
  Administered 2019-10-14: 50 mg via INTRAVENOUS
  Administered 2019-10-14: 200 mg via INTRAVENOUS

## 2019-10-14 MED ORDER — ONDANSETRON HCL 4 MG/2ML IJ SOLN
INTRAMUSCULAR | Status: DC | PRN
Start: 2019-10-14 — End: 2019-10-14
  Administered 2019-10-14: 4 mg via INTRAVENOUS

## 2019-10-14 MED ORDER — LIDOCAINE-EPINEPHRINE 1 %-1:100000 IJ SOLN
INTRAMUSCULAR | Status: AC
Start: 2019-10-14 — End: ?
  Filled 2019-10-14: qty 20

## 2019-10-14 MED ORDER — LACTATED RINGERS IV SOLN
INTRAVENOUS | Status: DC
Start: 2019-10-14 — End: 2019-10-14

## 2019-10-14 MED ORDER — ACETAMINOPHEN 325 MG PO TABS
650.0000 mg | ORAL_TABLET | Freq: Once | ORAL | Status: AC | PRN
Start: 2019-10-14 — End: 2019-10-14
  Administered 2019-10-14: 650 mg via ORAL
  Filled 2019-10-14: qty 2

## 2019-10-14 MED ORDER — FENTANYL CITRATE (PF) 50 MCG/ML IJ SOLN (WRAP)
25.0000 ug | INTRAMUSCULAR | Status: AC | PRN
Start: 2019-10-14 — End: 2019-10-14
  Administered 2019-10-14 (×4): 25 ug via INTRAVENOUS
  Filled 2019-10-14: qty 2

## 2019-10-14 MED ORDER — LIDOCAINE HCL 2 % IJ SOLN
INTRAMUSCULAR | Status: DC | PRN
Start: 2019-10-14 — End: 2019-10-14
  Administered 2019-10-14: 3 mL

## 2019-10-14 MED ORDER — EPHEDRINE SULFATE 50 MG/ML IJ/IV SOLN (WRAP)
Status: DC | PRN
Start: 2019-10-14 — End: 2019-10-14
  Administered 2019-10-14: 5 mg via INTRAVENOUS

## 2019-10-14 MED ORDER — EPHEDRINE SULFATE 50 MG/ML IJ/IV SOLN (WRAP)
Status: AC
Start: 2019-10-14 — End: ?
  Filled 2019-10-14: qty 1

## 2019-10-14 MED ORDER — LACTATED RINGERS IV SOLN
INTRAVENOUS | Status: DC | PRN
Start: 2019-10-14 — End: 2019-10-14

## 2019-10-14 MED ORDER — CLINDAMYCIN PHOSPHATE IN D5W 900 MG/50ML IV SOLN
INTRAVENOUS | Status: AC
Start: 2019-10-14 — End: ?
  Filled 2019-10-14: qty 50

## 2019-10-14 MED ORDER — KETAMINE HCL 50 MG/ML IJ SOLN
INTRAMUSCULAR | Status: DC | PRN
Start: 2019-10-14 — End: 2019-10-14
  Administered 2019-10-14 (×2): 40 mg via INTRAVENOUS

## 2019-10-14 MED ORDER — AMMONIA AROMATIC IN INHA
1.0000 | Freq: Once | RESPIRATORY_TRACT | Status: DC | PRN
Start: 2019-10-14 — End: 2019-10-14

## 2019-10-14 MED ORDER — HYDROMORPHONE HCL 0.5 MG/0.5 ML IJ SOLN
0.5000 mg | INTRAMUSCULAR | Status: DC | PRN
Start: 2019-10-14 — End: 2019-10-14
  Administered 2019-10-14 (×2): 0.5 mg via INTRAVENOUS
  Filled 2019-10-14 (×2): qty 0.5

## 2019-10-14 MED ORDER — NEOSTIGMINE METHYLSULFATE 1 MG/ML IJ/IV SOLN (WRAP)
Status: DC | PRN
Start: 2019-10-14 — End: 2019-10-14
  Administered 2019-10-14: 2 mg via INTRAVENOUS

## 2019-10-14 MED ORDER — ISOSULFAN BLUE 1 % SC SOLN
SUBCUTANEOUS | Status: AC
Start: 2019-10-14 — End: ?
  Filled 2019-10-14: qty 5

## 2019-10-14 MED ORDER — FENTANYL CITRATE (PF) 50 MCG/ML IJ SOLN (WRAP)
INTRAMUSCULAR | Status: AC
Start: 2019-10-14 — End: ?
  Filled 2019-10-14: qty 2

## 2019-10-14 MED ORDER — FENTANYL CITRATE (PF) 50 MCG/ML IJ SOLN (WRAP)
INTRAMUSCULAR | Status: DC | PRN
Start: 2019-10-14 — End: 2019-10-14
  Administered 2019-10-14: 50 ug via INTRAVENOUS
  Administered 2019-10-14: 100 ug via INTRAVENOUS
  Administered 2019-10-14: 50 ug via INTRAVENOUS

## 2019-10-14 MED ORDER — OXYCODONE HCL 5 MG PO TABS
5.0000 mg | ORAL_TABLET | Freq: Once | ORAL | Status: AC
Start: 2019-10-14 — End: 2019-10-14
  Administered 2019-10-14: 15:00:00 5 mg via ORAL
  Filled 2019-10-14: qty 1

## 2019-10-14 MED ORDER — SODIUM CHLORIDE 0.9% BAG (IRRIGATION USE)
INTRAVENOUS | Status: DC | PRN
Start: 2019-10-14 — End: 2019-10-14
  Administered 2019-10-14 (×2): 1000 mL

## 2019-10-14 MED ORDER — GLYCOPYRROLATE 0.2 MG/ML IJ SOLN (WRAP)
INTRAMUSCULAR | Status: DC | PRN
Start: 2019-10-14 — End: 2019-10-14
  Administered 2019-10-14: .4 mg via INTRAVENOUS
  Administered 2019-10-14: .1 mg via INTRAVENOUS

## 2019-10-14 MED ORDER — MIDAZOLAM HCL 1 MG/ML IJ SOLN (WRAP)
INTRAMUSCULAR | Status: AC
Start: 2019-10-14 — End: ?
  Filled 2019-10-14: qty 2

## 2019-10-14 MED ORDER — BUPIVACAINE HCL (PF) 0.5 % IJ SOLN
INTRAMUSCULAR | Status: AC
Start: 2019-10-14 — End: ?
  Filled 2019-10-14: qty 30

## 2019-10-14 MED ORDER — GLYCOPYRROLATE 1 MG/5ML IJ SOLN
INTRAMUSCULAR | Status: AC
Start: 2019-10-14 — End: ?
  Filled 2019-10-14: qty 5

## 2019-10-14 MED ORDER — PROPOFOL INFUSION 10 MG/ML
INTRAVENOUS | Status: DC | PRN
Start: 2019-10-14 — End: 2019-10-14
  Administered 2019-10-14: 100 ug/kg/min via INTRAVENOUS

## 2019-10-14 MED ORDER — LIDOCAINE-EPINEPHRINE 1 %-1:100000 IJ SOLN
INTRAMUSCULAR | Status: DC | PRN
Start: 2019-10-14 — End: 2019-10-14
  Administered 2019-10-14: 20 mL

## 2019-10-14 MED ORDER — LIDOCAINE HCL 2 % IJ SOLN
INTRAMUSCULAR | Status: AC
Start: 2019-10-14 — End: ?
  Filled 2019-10-14: qty 20

## 2019-10-14 MED ORDER — PROMETHAZINE HCL 25 MG/ML IJ SOLN
6.2500 mg | Freq: Once | INTRAMUSCULAR | Status: DC | PRN
Start: 2019-10-14 — End: 2019-10-14

## 2019-10-14 MED ORDER — TECHNETIUM TC 99M SULFUR COLLOID FILTERED
330.0000 | Freq: Once | Status: AC | PRN
Start: 2019-10-14 — End: 2019-10-14
  Administered 2019-10-14: 08:00:00 330 via INTRADERMAL
  Filled 2019-10-14: qty 20000

## 2019-10-14 MED ORDER — ISOSULFAN BLUE 1 % SC SOLN
SUBCUTANEOUS | Status: DC | PRN
Start: 2019-10-14 — End: 2019-10-14
  Administered 2019-10-14: 4 mL via SUBCUTANEOUS

## 2019-10-14 MED ORDER — KETAMINE HCL 50 MG/ML IJ SOLN
INTRAMUSCULAR | Status: AC
Start: 2019-10-14 — End: ?
  Filled 2019-10-14: qty 10

## 2019-10-14 MED ORDER — ROCURONIUM BROMIDE 50 MG/5ML IV SOLN
INTRAVENOUS | Status: AC
Start: 2019-10-14 — End: ?
  Filled 2019-10-14: qty 5

## 2019-10-14 MED ORDER — ACETAMINOPHEN 500 MG PO TABS
1000.0000 mg | ORAL_TABLET | ORAL | Status: AC
Start: 2019-10-14 — End: 2019-10-14
  Administered 2019-10-14: 06:00:00 1000 mg via ORAL

## 2019-10-14 MED ORDER — CLINDAMYCIN PHOSPHATE IN D5W 900 MG/50ML IV SOLN
900.0000 mg | INTRAVENOUS | Status: AC
Start: 2019-10-14 — End: 2019-10-14
  Administered 2019-10-14: 08:00:00 900 mg via INTRAVENOUS

## 2019-10-14 MED ORDER — SUCCINYLCHOLINE CHLORIDE 20 MG/ML IJ SOLN
INTRAMUSCULAR | Status: AC
Start: 2019-10-14 — End: ?
  Filled 2019-10-14: qty 10

## 2019-10-14 MED ORDER — SUCCINYLCHOLINE CHLORIDE 20 MG/ML IJ SOLN
INTRAMUSCULAR | Status: DC | PRN
Start: 2019-10-14 — End: 2019-10-14
  Administered 2019-10-14: 160 mg via INTRAVENOUS

## 2019-10-14 SURGICAL SUPPLY — 194 items
ADHESIVE DERMABOND PRINEO 22CM (Skin Closure) ×3
ADHESIVE SKIN CLOSURE DERMABOND ADVANCED (Skin Closure) ×6 IMPLANT
ADHESIVE SKIN CLOSURE DERMABOND ADVANCED .7 ML LIQUID APPLICATOR (Skin Closure) ×6 IMPLANT
ADHESIVE SKIN CLOSURE DERMABOND PRINEO (Skin Closure) ×9 IMPLANT
ADHESIVE SKIN CLOSURE DERMABOND PRINEO LIQUID 2-OCTYL CYANOACRYLATE (Skin Closure) ×9 IMPLANT
ADHESIVE SKIN DERMABOND ADV (Skin Closure) ×2
ADHESIVE SKNCLS 2 OCTYL CYNCRLT .7ML (Skin Closure) ×6
ADHESIVE SKNCLS 2-OCTYL CYNCRLT DRMBND (Skin Closure) ×9
APPLIER IN CLP PRM SGCLP II SUP INTLK (Procedure Accessories) ×6
APPLIER INTERNAL CLIP L9.75 IN AUTOMATIC (Procedure Accessories) ×6 IMPLANT
APPLIER INTERNAL CLIP L9.75 IN AUTOMATIC PREMIUM SURGICLIP II SUPER (Procedure Accessories) ×6 IMPLANT
BLADE 10 BARD-PARKER RIB-BACK CARBON (Blade) ×3 IMPLANT
BLADE 10 BARD-PARKER RIB-BACK CARBON STEEL TISSUE SURGICAL (Blade) ×3 IMPLANT
BLADE ELECTRODE INSULATED (Cautery) ×1
BLADE SRG CBNSTL 10 BP RB-BCK LF STRL (Blade) ×3
BLADE SURG 10 (Blade) ×1
BRA 2XL POST SURGICAL FRONT CLOSURE JP (Patient Supply) ×3 IMPLANT
BRA 2XL POST SURGICAL FRONT CLOSURE JP DRAIN BULB RIBBON LOOP (Patient Supply) ×3 IMPLANT
BRA ELIZABETH PINK 2XLARGE (Patient Supply) ×1
BRA LARGE ADJ SHLDR STRAP FRONT CLOSURE POST LYCRA VELCRO COTTON WHITE (Patient Supply) IMPLANT
BRA POST SRG LYCRA VLCR CTTN LG ADJ (Patient Supply) IMPLANT
BRA POST SRG VLCR LYCRA NYL 2XL (Patient Supply) ×3
BRA SURGICAL VELCRO LRG 38-40I (Patient Supply)
BULB DRAINAGE LIGHTWEIGHT LOW LEVEL (Drain) ×6 IMPLANT
BULB DRAINAGE LIGHTWEIGHT LOW LEVEL SUCTION RELIAVAC SILICONE 100 CC (Drain) ×6 IMPLANT
BULB DRN SIL 100CC LF STRL LTWT LO LVL (Drain) ×6
CONTAINER SPEC PE 4OZ PRCS 2.5X3IN LF (Procedure Accessories) ×3 IMPLANT
CONTAINER STRL 4.5OZ (Procedure Accessories) ×1
DEVICE CLOSURE L23 IN 3-0 P-12 V-LOC 90 (Suture) ×6 IMPLANT
DEVICE CLSR 3-0 P-12 V-LOC 90 SURGALLOY (Suture) ×6
DRAIN BLAKE 15F (Drain) ×2
DRAIN INCS SIL 3/16IN .75 FLUT RND BARD (Drain) ×6
DRAIN OD15 FR 4 FREE FLOW CHANNEL (Drain) ×6 IMPLANT
DRAIN OD15 FR 4 FREE FLOW CHANNEL RADIOPAQUE TROCAR BARD L3/16 IN (Drain) ×6 IMPLANT
DRAN EVACUATOR WOUND 100CC (Drain) ×2
DRAPE 96X5IN ISOSILK SMALL T TIP BAND TAPE STRIP GEL PROBE LATEX FREE (Procedure Accessories) ×6 IMPLANT
DRAPE PRB ISOSILK 96X5IN LF STRL SM T (Procedure Accessories) ×6 IMPLANT
DRESSING BIOPATCH ANTIMIC 1"7M (Dressing) ×2
DRESSING GERMICIDAL 1 CHG BIOPATCH (Dressing) ×6 IMPLANT
DRESSING GERMICIDAL BIOPATCH (Dressing) ×2
DRESSING GERMICIDAL BIOPATCH CHLORHEXIDINE GLUCONATE DISC OD1 IN (Dressing) ×6 IMPLANT
DRESSING GRMCDL CHG DISC BPTCH 1IN 13-20 (Dressing) ×6
DRESSING TEGADERM 4X4X3/4IN (Dressing)
DRESSING TEGADERM FRAME 6X7CM (Dressing) ×2
DRESSING TRANSPARENT L2 3/4 IN X W2 3/8 (Dressing) ×6 IMPLANT
DRESSING TRANSPARENT L2 3/4 IN X W2 3/8 IN POLYURETHANE ADHESIVE (Dressing) ×6 IMPLANT
DRESSING TRANSPARENT L4 3/4 IN X W4 IN (Dressing) IMPLANT
DRESSING TRANSPARENT L4 3/4 IN X W4 IN POLYURETHANE ADHESIVE (Dressing) IMPLANT
DRESSING TRNS PU STD TGDRM 2.75INX2 3/8 (Dressing) ×6
DRESSING TRNS PU STD TGDRM 4.75X4IN LF (Dressing)
ELECTRODE ADULT PATIENT RETURN L9 FT REM POLYHESIVE ACRYLIC FOAM (Procedure Accessories) ×9 IMPLANT
ELECTRODE BLADE (Cautery) ×1
ELECTRODE ELECTROSRGCL BLADE L2.75IN OD3/32 IN L.2IN STD SHAFT HEX LCK (Cautery) ×3 IMPLANT
ELECTRODE ELECTROSURGICAL BLADE L2.75 IN (Cautery) ×3 IMPLANT
ELECTRODE ELECTROSURGICAL BLADE L4 IN (Cautery) ×3 IMPLANT
ELECTRODE ELECTROSURGICAL BLADE L4 IN OD3/32 IN EDGE L.2 IN INSULATE (Cautery) ×3 IMPLANT
ELECTRODE ESURG BLDE EDG 3/32IN 2.75IN (Cautery) ×3
ELECTRODE ESURG BLDE EDG 3/32IN 4IN LF (Cautery) ×3
ELECTRODE PATIENT RETURN L9 FT VALLEYLAB (Procedure Accessories) ×9 IMPLANT
ELECTRODE PT RTN RM PHSV ACRL FM C30- LB (Procedure Accessories) ×9
GLOVE SRG 7 BGL SRG LTX STRL PF BEAD CUF (Glove) ×6
GLOVE SRG 7.5 BGL SNSR LTX STRL PF TXTR (Glove) ×6
GLOVE SRG NTR RBR 6 BGL SRG LTX STRL PF (Glove) ×3
GLOVE SRGCL 7 1/2 BIOGEL SENSOR PWDR FREE BEAD CUFF STRW STRGHT FNGR (Glove) ×6 IMPLANT
GLOVE SURG BIOGEL PF LTX SZ6.0 (Glove) ×1
GLOVE SURG BIOGEL PF LTX SZ7.0 (Glove) ×2
GLOVE SURG BIOGEL SENSE SZ 7.5 (Glove) ×2
GLOVE SURGICAL 6 BIOGEL SURGEONS POWDER (Glove) ×3 IMPLANT
GLOVE SURGICAL 6 BIOGEL SURGEONS POWDER FREE BEAD CUFF TEXTURE SURFACE (Glove) ×3 IMPLANT
GLOVE SURGICAL 7 1/2 BIOGEL SENSOR (Glove) ×6 IMPLANT
GLOVE SURGICAL 7 BIOGEL SURGEONS POWDER (Glove) ×6 IMPLANT
GLOVE SURGICAL 7 BIOGEL SURGEONS POWDER FREE BEAD CUFF TEXTURE SURFACE (Glove) ×6 IMPLANT
GOWN PREVNTN+ XLARGE STRL (Gown) ×1
GOWN SRG XL ORBS LF STRL AAMI LVL 4 (Gown) ×3
GOWN SURGICAL XL BLUE AAMI LEVEL 4 BREATHABLE CSR WRAP PROTECTION (Gown) ×3 IMPLANT
GOWN SURGICAL XL MEDLINE BLUE AAMI LEVEL (Gown) ×3 IMPLANT
HANDLE LGHT LF STRL ADP LGHT CNTRL + TCH (Other) ×3
HANDLE LIGHT ADAPTIVE LIGHT CONTROL PLUS (Other) ×3 IMPLANT
HANDLE LIGHT ADAPTIVE LIGHT CONTROL PLUS TECHNOLOGY SNAP ON LENS TOUCH (Other) ×3 IMPLANT
HANDLE TRUMPF LIGHT  DISP (Other) ×1
KIT INFECTION CONTROL CUSTOM (Kits) ×8 IMPLANT
KIT INFECTION CONTROL CUSTOM IFOH03 (Kits) ×6 IMPLANT
MANIFOLD NEPTUNE II 4PORT SUCT (Filter) ×2
MANIFOLD SCT 2 STD NPTN 2 LF NS 4 PORT (Filter) ×6
MANIFOLD SUCTION 2 STANDARD 4 PORT (Filter) ×6 IMPLANT
MANIFOLD SUCTION 2 STANDARD 4 PORT NEPTUNE 2 WASTE MANAGEMENT SYSTEM (Filter) ×6 IMPLANT
MRKR SKN W RULER AND LABELS (Positioning Supplies) ×2
NEEDLE HPO PP RW BD 22GA 1.5IN LF STRL (Needles) ×3 IMPLANT
NEEDLE HYPODERMIC L1 1/2 IN OD22 GA REGULAR WALL BD POLYPROPYLENE (Needles) ×3 IMPLANT
NEEDLE YALE SHRTBV 22GX1.5 DSP (Needles) ×1
PAD ABD PVC CRTY 9X5IN LF STRL 3 LYR (Dressing) ×18 IMPLANT
PAD ELECTROSRG GRND REM W CRD (Procedure Accessories) ×3
PAD PREP CUFF 24X41IN W 9IN (Prep) ×1
PAD SRGPRP 44X24IN NS CUF 9IN (Prep) ×3 IMPLANT
PAD TENDRSRB ABD STRL 5X9IN (Dressing) ×6
PEN SRGMRK 6IN LF STRL RLR LG RSRV REG (Positioning Supplies) ×6
PEN SURGICAL MARKING MEDLINE SKIN RULER (Positioning Supplies) ×6 IMPLANT
PEN SURGICAL MARKING SKIN RULER LARGE RESERVOIR REGULAR TIP LABEL (Positioning Supplies) ×6 IMPLANT
PENCIL SMKEVC LF COAT PSHBTN (Cautery) ×6
PENCIL SMOKE EVAC 70MM PSH BTN (Cautery) ×2
PENCIL SMOKE EVACUATOR COATED PUSH (Cautery) ×6 IMPLANT
PENCIL SMOKE EVACUATOR COATED PUSH BUTTON NEPTUNE E-SEP (Cautery) ×6 IMPLANT
PIN SAFETY 2 MD (Procedure Accessories) ×4 IMPLANT
PIN SAFETY STAINLESS STEEL NICKEL 2 1.5*NONSTERILE 83-3039-2C (Procedure Accessories) ×3 IMPLANT
PROBE COVER T SHAPE LATEX FREE (Procedure Accessories) ×2
SCRUB POVIDONE IODINE SOL 4OZ (Scrub Supplies) ×1
SLN PREP POVIDONE IODINE 4OZ (Scrub Supplies) ×1
SOL NACL.9% 1000ML IRR NONLTX (Irrigation Solutions) ×1
SOLN IRRISEPT WOUND DEBRIDEMNT (Solution) ×2
SOLUTION IRR 0.9% NACL 1000ML LF STRL (Irrigation Solutions) ×3
SOLUTION IRRIGATION 0.9% SODIUM CHLORIDE (Irrigation Solutions) ×3 IMPLANT
SOLUTION IRRIGATION 0.9% SODIUM CHLORIDE 1000 ML PLASTIC POUR BOTTLE (Irrigation Solutions) ×3 IMPLANT
SOLUTION PREP ANTISEPTIC SCREW TOP (Scrub Supplies) ×6 IMPLANT
SOLUTION PREP ANTISEPTIC SCREW TOP BOTTLE LIQUID 7.5% PVP IODINE 4 OZ (Scrub Supplies) ×3 IMPLANT
SOLUTION PREP ANTISEPTIC SCRW BTTL LQD MDLN 10% PVP IODNE 4OZ DRK BRWN (Scrub Supplies) ×3 IMPLANT
SOLUTION PRP 10% PVP IOD 4OZ LF ANSEP (Scrub Supplies) ×3
SOLUTION PRP 7.5% PVP IOD 4OZ LF ANSEP (Scrub Supplies) ×3
SPONGE GAUZE L4 IN X W4 IN 12 PLY (Sponge) IMPLANT
SPONGE GAUZE L4 IN X W4 IN 12 PLY CRINKLE WEAVE FLUFF WOVEN KERLIX (Sponge) IMPLANT
SPONGE GAUZE STR 10'S 12PLY4X4 (Sponge)
SPONGE GZE KRLX 4X4IN LF NS 12 PLY CRNKL (Sponge)
SPONGE GZE PLS CTTN CRTY 4X4IN LF STRL (Sponge)
SPONGE GZE STR 2'S 12PLY 4X4 (Sponge)
SPONGE LAP 18X18IN PREWASH WHT (Sponge) ×6
SPONGE LAP XRAY 18X18IN (Sponge) ×2
SPONGE LAPAROTOMY L18 IN X W18 IN (Sponge) ×6 IMPLANT
SPONGE LAPAROTOMY L18 IN X W18 IN PREWASH WHITE (Sponge) ×6 IMPLANT
STAPLER SKIN PREMIUM 35W (Staplers) ×1
STAPLER SKIN W4.8 MM X H3.4 MM 35 WIDE (Staplers) ×3 IMPLANT
STAPLER SKN SS MF PRM .51MM 4.8X3.4MM LF (Staplers) ×3
SUPPORT MAMMARY LARGE (Patient Supply) ×1
SURGICLIP PREMIUM II M-9.75 (Procedure Accessories) ×2
SUT ETHILON 2-0 FS 18IN (Suture) ×1
SUT VICRYL 3-0 SH 27IN (Suture) ×4
SUTURE 3-0 FS-1 24MM (Suture) ×2
SUTURE ABS 3-0 PS2 MNCRL MTPS 18IN MFL (Suture) ×6
SUTURE ABS 3-0 PS2 MNCRL MTPS 27IN MFL (Suture) ×12
SUTURE ABS 3-0 SH VCL 27IN BRD COAT UD (Suture) ×12
SUTURE ABS 4-0 PS2 MNCRL MTPS 27IN MFL (Suture) ×21
SUTURE COATED VICRYL 3-0 SH L27 IN BRAID (Suture) ×12 IMPLANT
SUTURE ETHILON BLACK 2-0 FS L18 IN (Suture) ×3 IMPLANT
SUTURE ETHILON BLACK 3-0 FS-1 L18 IN (Suture) ×6 IMPLANT
SUTURE MONOCRYL 3-0 PS-2 18IN (Suture) ×2
SUTURE MONOCRYL 3-0 PS-2 L18 IN (Suture) ×6 IMPLANT
SUTURE MONOCRYL 3-0 PS-2 L18 IN MONOFILAMENT UNDYED ABSORBABLE (Suture) ×6 IMPLANT
SUTURE MONOCRYL 3-0 PS-2 L27 IN (Suture) ×12 IMPLANT
SUTURE MONOCRYL 3-0 PS-2 L27 IN MONOFILAMENT UNDYED ABSORBABLE (Suture) ×12 IMPLANT
SUTURE MONOCRYL 3-0 PS2 27IN (Suture) ×4
SUTURE MONOCRYL 4-0 PS-2 L27 IN (Suture) ×21 IMPLANT
SUTURE MONOCRYL 4-0 PS-2 L27 IN MONOFILAMENT UNDYED ABSORBABLE (Suture) ×21 IMPLANT
SUTURE MONOCRYL 4-0 PS2 27IN (Suture) ×7
SUTURE NABSB 2-0 FS ETH 18IN MFL BLK (Suture) ×3
SUTURE NABSB 3-0 FS1 ETH MTPS 18IN MFL (Suture) ×6
SUTURE NABSB SLK 2-0 FS PRMHND 18IN BRD (Suture) ×6
SUTURE NYLON ETHILON BLACK SILVER 2-0 L18 IN L26 MM REVERSE CUTTING FS (Suture) ×3 IMPLANT
SUTURE SILK 2-0 FS 18IN (Suture) ×2
SUTURE SILK PERMA HAND BLACK 2-0 FS L18 (Suture) ×6 IMPLANT
SUTURE SILK PERMA HAND BLACK 2-0 FS L18 IN BRAID NONABSORBABLE (Suture) ×6 IMPLANT
SUTURE V-LOC 90 3-0 P-12 3/8 CIRCLE L23 IN ABS UNDYED (Suture) ×6 IMPLANT
SUTURE VLOC 90 ABSRB 3-0 23IN (Suture) ×2
SYRINGE 10 ML GRADUATE NONPYROGENIC DEHP (Syringes, Needles) ×3 IMPLANT
SYRINGE 10 ML GRADUATE NONPYROGENIC DEHP FREE PVC FREE LOK MEDICAL (Syringes, Needles) ×3 IMPLANT
SYRINGE 5 ML GRADUATE NONPYROGENIC DEHP (Syringes, Needles) ×3 IMPLANT
SYRINGE 5 ML GRADUATE NONPYROGENIC DEHP FREE PVC FREE LOK MEDICAL (Syringes, Needles) ×3 IMPLANT
SYRINGE LUER LOCK 10CC (Syringes, Needles) ×1
SYRINGE LUER-LOK W/SAFETY 5ML (Syringes, Needles) ×1
SYRINGE MED 10ML LL LF STRL GRAD N-PYRG (Syringes, Needles) ×3
SYRINGE MED 5ML LL LF STRL GRAD N-PYRG (Syringes, Needles) ×3
SYSTEM WND IRR 0.05% CHG IRRISEPT LF (Solution) ×6
SYSTEM WOUND IRRIGATION DEBRIDEMENT (Solution) ×6 IMPLANT
SYSTEM WOUND IRRIGATION DEBRIDEMENT CLEANSING IRRISEPT 0.05% (Solution) ×6 IMPLANT
TOWEL L27 IN X W17 IN COTTON PREWASH (Other) ×3 IMPLANT
TOWEL L27 IN X W17 IN COTTON PREWASH DELINT HIGH ABSORBENT BLUE (Other) ×3 IMPLANT
TOWEL OR DISP 10PK (Other) ×1
TOWEL SRG CTTN 27X17IN LF STRL PREWASH (Other) ×3
TRAY CATH RBR BARD 15FR LTX SPEC CNTNR (Catheter Miscellaneous) ×3 IMPLANT
TRAY DRY SKIN SCRUB (Prep) ×1
TRAY MAJOR PLASTIC FOAKS (Pack) ×1
TRAY MAJOR PROC FOAKS (Pack) ×2
TRAY SKIN SCRUB L8 IN 6 WING 6 SPONGE STICK 2 TIP APPLICATOR DRY VINYL (Prep) ×3 IMPLANT
TRAY SKIN SCRUB MEDLINE L8 IN VINYL (Prep) ×3 IMPLANT
TRAY SKN SCRB VNYL CTTN 8IN LF STRL 6 (Prep) ×3
TRAY SRG MAJOR PLASTIC IFOH (Pack) ×3 IMPLANT
TRAY SRG MAJOR PROC IFOH (Pack) ×6 IMPLANT
TRAY SURGICAL MAJOR (Pack) ×6 IMPLANT
TRAY SURGICAL MAJOR PLASTIC FOAKS (Pack) ×3 IMPLANT
TRAY URETH CATHERIZA (Catheter Micellaneous) ×1
TUBING CONNECTING STERILE 10FT (Tubing) ×2
TUBING SCT PVC ARG 3/16IN 10FT LF STRL (Tubing) ×6
TUBING SUCTION ID3/16 IN L10 FT (Tubing) ×6 IMPLANT
TUBING SUCTION ID3/16 IN L10 FT NONCONDUCTIVE STRAIGHT MALE FEMALE (Tubing) ×6 IMPLANT
VEST POSTOPERATIVE 4 LARGE FRONT ZIP (Patient Supply) ×3 IMPLANT
VEST POSTOPERATIVE 4 LARGE FRONT ZIP ADJUSTABLE SHOULDER STRAP CUP (Patient Supply) ×3 IMPLANT
VEST PS 4 LG JOBST LF FRN ZIP ADJ SHLDR (Patient Supply) ×3

## 2019-10-14 NOTE — Interval H&P Note (Signed)
PRESURGICAL CHECKLIST    I have personally reviewed the patient's H and P and all relevant imaging. I have re- examined the patient and marked the appropriate site.  There are no changes to the patient's physical condition or planned procedure.    Exceptions: Will also remove mediport.    I have discussed the planned procedure along with the risks and benefits with the patient.  All questions were answered.  The written consent was reviewed with the patient and obtained. We will proceed.    Henderson Baltimore, MD, FACS  [facility]    Allergies:   Allergies   Allergen Reactions    Valsartan Other (See Comments)     achiness    Penicillins Hives     Symptoms occurred as child  Onset date: 07-28-2004       Antibiotics: Clindamycin 600 mg IV on call    Medical clearance:None    Smoking Screen:No history    Diabetic Screen: No history    Pain Management: Counseling provided    Sleep Apnea: No history    DVT Screen:  Patients undergoing breast cancer surgery are at increased risk of developing DVT's and pulmonary embolisms.      Each Risk Factor Represents 1 Point.    This patient has:  Age 39-60  Lumpectomy/excisional biopsy/simple mastectomy without reconstruction  Obesity ( BMI>25)    Each Risk Factor Represents 2 Points  This patient has:  Malignancy (present or previous)    Each Risk Factor Represents 3 Points  This patient has:  History of DVT/PE    Each Risk Factor Represents 5 points  This patient has:  None    Total:  8      Based on this screen, the patient is at High Risk.  We need to weigh the risks and benefits of DVT/PE prophylaxis vs bleeding.  Will restart Eliquis after surgery.    Would recommend:  Total Risk= 0  (<0.1%)       Very low= Early ambulation    Total Risk = 1-2 (0.1%)     Low=  Sequential compression device (SCD)    Total Risk = 3-4 (0.6%) Low/ Moderate= Sequential compression device (SCD)     Total Risk = 5-6 (1.27%) Moderate =Sequential compression device (SCD)    Total Risk =  7-8 (2.5%) High= SCD and Lovenox* (40 mg SQ for 10 days)    Total Risk=>8 (11.3%) High= SCD and Lovenox* ( 40 mg SQ for 10 days)

## 2019-10-14 NOTE — Anesthesia Postprocedure Evaluation (Signed)
Anesthesia Post Evaluation    Patient: Leslie [name]    Procedure(s) with comments:  BIOPSY, SENTINEL NODE  REMOVAL, MEDIPORT  MAMMOPLASTY/ONCOPLASTY - Right Wt: 1318g  Left Wt: 1319g  MAMMOPLASTY, REDUCTION (MEDICAL)  BREAST, LUMPECTOMY, WITH MAG SEED LOCALIZATION    Anesthesia type: general    Last Vitals:   Vitals Value Taken Time   BP 136/78 10/14/19 1250   Temp 36.4 C (97.5 F) 10/14/19 1250   Pulse 88 10/14/19 1300   Resp 20 10/14/19 1300   SpO2 100 % 10/14/19 1300                 Anesthesia Post Evaluation:     Patient Evaluated: PACU    Level of Consciousness: awake and alert  Pain Score: 4  Pain Management: adequate    Airway Patency: patent    Anesthetic complications: No      PONV Status: none    Cardiovascular status: acceptable  Respiratory status: acceptable  Hydration status: acceptable        Signed by: Andreas Newport, 10/14/2019 1:04 PM

## 2019-10-14 NOTE — Discharge Instructions (Signed)
Caring for a Closed Suction Drainage Tube  A drainage tube removes fluid from around an incision. This helps prevent infection and promotes healing. The collection bulb at the end of the tube is squeezed and plugged to create suction. The bulb should be emptied and reset when half full to maintain adequate suction. You need to empty the bulb and clean the skin around the drain as often as your healthcare provider tells you to. Follow the steps you were taught in the hospital.        Supplies  Have the following items ready:  · Disposable gloves  · Measuring cup  · Record sheet  · Gauze or paper towel  · Sterile cotton swabs or 4" x 4" gauze pads  · Sterile saline or soap and water  When to call your healthcare provider  Call your healthcare provider if you notice any of these changes:  · The amount of fluid increases or decreases suddenly  · Large amount of blood or a clot in drainage  · Color, odor, or thickness of the fluid changes  · Tube falls out or the incision opens  · The stitch that holds the drain in place falls out, or is no longer attached to the drain.  · Skin around the drain is red, swollen, painful, or seeping pus  · You have a fever of 100.4°F ( 38°C ) or higher, or as directed by your healthcare provider  Tips  Here are tips to drain the tube:  · Uncurl any kinks in the tube.  · With one hand, firmly hold the base of the tube between your thumb and index finger. Don't touch the incision.  · Put the thumb and index finger of your other hand on the tube, next to the first hand. Pinch your fingers together. Then pull them along the tube toward the bag. This will help push any clogged fluid through the tube. This is called "stripping the tube." You may find it helpful to hold an alcohol swab between your fingers and the tube to lubricate the tubing.  · If the tube still does not drain, call your healthcare provider.  [employer] last reviewed this educational content on 10/18/2017  © 2000-2021 [employer]  [employer]. All rights reserved. This information is not intended as a substitute for professional medical care. Always follow your healthcare professional's instructions.      How to Empty Your Drain After Surgery  Step-by-Step:    [employer] last reviewed this educational content on 10/18/2017  © 2000-2021 [employer]. All rights reserved. This information is not intended as a substitute for professional medical care. Always follow your healthcare professional's instructions.      Discharge Instructions: Caring for Your Jackson-Pratt Drainage Tube  Your healthcare provider discharged you with a Jackson-Pratt drainage tube. They commonly leave this drain within the abdomen and other cavities after surgery. It helps drain and collect blood and body fluid after surgery. This can prevent swelling and reduces the risk for infection. The tube is held in place by a few stitches. It's covered with a bandage. Your healthcare provider will remove the drain when he or she determines you no longer need it.  Home care  · Don’t sleep on the same side as the tube.  · Secure the tube and bag inside your clothing with a safety pin. This helps keep the tube from being pulled out.  · Empty your drain at least twice a day. Empty it more often if   the drain is full. Wash  and dry your hands before emptying the drain.  ? Lift the opening on the drain.  ? Drain the fluid into a measuring cup.  ? Record the amount of fluid each time you empty the drain. Include the date and time it was emptied. Share this information with your healthcare provider on your next visit.  ? Squeeze the bulb with your hands until you hear air coming out of the bulb if your healthcare provider has instructed you to do so (sometimes the bulb is used as a reservoir without suction). Check with your healthcare provider about specific drain instructions.  ? Close the opening.  · If you are to change the dressing around the tube, follow the  directions your provider has given you. Some dressings may not need to be changed, but your provider will let you know. Here are some general steps to follow:  ? Wash your hands.  ? Remove the old bandage.  ? Wash your hands again.  ? Clean the skin around the incision and tube site as instructed.  ? Put a new bandage on the incision and tube site. Make the bandage large enough to cover the whole incision area.  ? Tape the bandage in place.  · Talk with your healthcare provider about showering with the drain. You may need to keep the bandage and tube site dry when you shower. Ask your healthcare team about the best way to do this.  · “Stripping” the tube helps keep blood clots from blocking the tube. Ask your healthcare team how often you should strip the tube. Stripping may not be needed, depending on where and why your healthcare provider placed the tube. It may even be dangerous in some cases.   ? Hold the tubing where it leaves the skin, with one hand. This keeps it from pulling on the skin.  ? Pinch the tubing with the thumb and first finger of your other hand.  ? Slowly and firmly pull your thumb and first finger down the tubing. You may find it helpful to hold an alcohol swab between your fingers and the tube to lubricate the tubing.  ? If the pulling hurts or feels like the tube is coming out of the skin, stop. Begin again more gently.    Follow-up care  Make a follow-up appointment as directed by our staff.  When to call your healthcare provider  Call your healthcare provider right away if you have any of the following:  · New or increased pain around the tube  · Redness, swelling, or warmth around the incision or tube  · Drainage that is foul-smelling  · Vomiting  · Fever of 100.4°F ( 38°C) or higher, or as directed by your provider  · Fluid leaking around the tube  · Incision seems not to be healing  · Stitches become loose  · Tube falls out or breaks  · Drainage that changes from light pink to dark  red  · Blood clots in the drainage bulb  · A sudden increase or decrease in the amount of drainage (over 30 mL)  [employer] last reviewed this educational content on 11/17/2017  © 2000-2021 [employer]. All rights reserved. This information is not intended as a substitute for professional medical care. Always follow your healthcare professional's instructions.

## 2019-10-14 NOTE — Op Note (Signed)
Procedure Date: 10/14/2019     Patient Type: A     SURGEON: Felipe Drone MD  ASSISTANT:  Bari Edward Yemc PA     PREOPERATIVE DIAGNOSES:  1.  Left breast cancer.  2.  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  3.  Bilateral gigantomastia.     POSTOPERATIVE DIAGNOSES:  1.  Left breast cancer.  2.  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  3.  Bilateral gigantomastia.     TITLE OF PROCEDURE:  1.  Left partial mastectomy reconstruction with oncoplastic technique.  2.  Right reduction mammaplasty (medical).     ANESTHESIA:  General.     INDICATIONS FOR PROCEDURE:  The patient is a 49 year old female referred by Dr. Henderson Baltimore for  left partial mastectomy reconstruction.  Due to a large anticipated defect,  an immediate oncoplastic reconstruction was recommended.  She had  significant bilateral gigantomastia and was a good candidate for a  reduction style approach for the oncoplastic reconstruction.  The benefits,  risks, and potential complications of the procedures including but not  limited to bleeding, infection, seroma, delayed wound healing, DVT or  pulmonary embolism, contour irregularities, poor result, and the potential  need for revisions in the future were all explained.  The patient also had  a previous MediPort clot and has been on Eliquis.  She was instructed to  resume her Eliquis anticoagulation tomorrow and the risk of bleeding  associated with that treatment was all explained.  Informed consent was  obtained, the patient was transferred to the operating room.     DESCRIPTION OF PROCEDURE:  The patient was placed supine on the operating room table.  Sequential  compression devices were placed on both lower extremities prior to  induction of anesthesia and remained on until after extubation.   Intravenous antibiotics were given prior to surgery.  After general  anesthesia was established, the chest was prepped and draped in standard  fashion.  Dr. Cyndi Bender  removed the right chest wall MediPort and performed  the left partial mastectomy.  The lesion was located in the outer quadrants  of the breast and therefore, the pedicle for the reconstruction was based  off the superomedial pedicle.  She will dictate that in a separate  operative report.  Simultaneously, the right reduction mammoplasty was  started after the MediPort site was closed by the primary team.  Wise  pattern skin excision was marked on both sides preoperatively.  A 42-mm  cookie cutter was used to mark each nipple-areola complex and the superior  medial pedicle was deepithelialized.  A C-shaped portion of excess skin and  breast parenchyma was dissected and excised with electrocautery and passed  off the field for pathology.  On the right side, the total weight was 1318  grams.  Irrisept solution was used to irrigate the breast and hemostasis  was achieved with electrocautery and also with the use of surgical clips  for large perforators.  The 15 round Blake drain was placed.  The skin was  closed with 3-0 Monocryl in the deep dermal layer and a 4-0 Monocryl  running subcuticular closure around the nipple-areola complex and the  vertical aspect of the Wise pattern.  The inframammary incision was closed  with combination of 3-0 Monocryl in deep dermal layer and a 3-0 V-Loc  running subcuticular closure.     After the partial mastectomy was completed, the same pedicle was dissected  and again a C-shaped portion of  excess skin and breast parenchyma was  excised around it.  The specimen sent off by Dr. Cyndi Bender weighed  approximately 60 grams.  The specimen removed from the left side weighed  1319 grams.  Again, the breast pocket was irrigated with Irrisept solution  and hemostasis completed in the same manner.  Another drain was placed and  closure was completed in the same manner as the right side.  Upon  completion, there was good overall shape and symmetry.  Prineo dressing was  applied.  All sponge,  needle, and instrument counts were correct at the end  of the procedure.  She tolerated the procedure well and was awakened from  anesthesia without incident.  She was placed in a surgical bra, extubated,  and transferred to recovery in stable condition.  Of note, there were no  medical students or residents available for assistance or retraction;  therefore, Sonda Rumble, PA, was also a critical provider for the entirety  of this medically necessary surgery.  I was also present for all critical  portions of the surgery.           D:  10/14/2019 11:46 AM by Dr. Lilly Cove. Sharen Heck, MD ([id number])  T:  10/14/2019 12:52 PM by NTS      Everlean Cherry: [id number]) (Doc ID: [id number])

## 2019-10-14 NOTE — Op Note (Signed)
Procedure Date: 10/14/2019     Patient Type: A     SURGEON: Boykin Peek MD  ASSISTANT:       FIRST ASSISTANT:  Phoebe Sharps.     PREOPERATIVE DIAGNOSIS:  Left breast carcinoma, status post neoadjuvant chemotherapy.     POSTOPERATIVE DIAGNOSIS:  Left breast carcinoma, status post neoadjuvant chemotherapy.     TITLE OF PROCEDURES:  1.  Left partial segmental mastectomy with Magseed localization.  2.  Left axillary sentinel lymph node biopsy with lymphatic mapping.  3.  Removal of right IJ Mediport.     ANESTHESIA:  General.     ESTIMATED BLOOD LOSS:  Minimal.     FLUIDS:  Crystalloids.     COMPLICATIONS:  None.     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  She is status post neoadjuvant chemotherapy.  We are  doing this procedure now to establish local control.     DESCRIPTION OF PROCEDURE:  The patient was seen in the preoperative holding area, the planned  procedure was again discussed along with the risks, benefits, options, and  alternatives.  The patient had been marked by Dr. Sharen Heck.  The patient was  brought back to the operating room, placed on the operating table in the  usual supine position.  After the appropriate monitoring devices were  applied, the patient underwent general anesthesia.  DVT prophylaxis was  assured.  IV antibiotics were administered, and a surgical pause was  performed.  The films had been reviewed extensively.  The location of the  [facility] was identified.  Approximately 4 mL of isosulfan blue was injected  in the retroareolar area on the left side, approximately 0.3 mCi was also  injected in the retroareolar area.  The patient's upper chest, neck and  axilla were prepped and draped in the usual sterile manner on both sides.   We first turned our attention to the right side.  A local field block was  established using 1%  lidocaine and 0.5% Marcaine in the area of the  MediPort.  A transverse incision was made and carried down through the skin  and subcutaneous tissue.  The MediPort and catheter were identified.  The  MediPort and catheter were dissected out and removed in total.  The tunnel  was then suture ligated with 3-0 Vicryl.  The cavity was irrigated with  Irrisept briefly.  The wound was then closed in 2 layers with interrupted  3-0 Vicryl and subcuticular 4-0 PDS.  We then turned our attention to the  left side.  A local field block was established using 1% lidocaine.  A  transverse incision was made and carried down through the skin and  subcutaneous tissue within the axilla.  Upon entering the deep axilla, the  sentinel nodal tissue was dissected out using Metzenbaum scissors and  electrocautery.  Two sentinel nodes were identified and were noted to be  both blue and radioactive with maximal counts up into the 2200 range.  The  rest of the axilla was scanned and there were no other blue or radioactive  lymph nodes to be identified.  The sentinel nodal tissue was sent to  pathology for frozen section and according to Dr. Zachery Conch, it was  negative.  Meanwhile, the wound was irrigated.  Hemostasis was checked for  and achieved using electrocautery.  Avitene  was placed within the cavity  due to the patient being on chronic anticoagulation.  The wound was then  closed in 2 layers with interrupted 3-0 Vicryl and subcuticular 4-0 PDS.   Hemostasis and lymphostasis were achieved using electrocautery as well as  medium Hemoclips throughout.  Meanwhile, after that, we then turned our  attention to the left breast.  Again, using the [facility] localizer, the  location of the increased signaling was identified in the lower outer  quadrant.  The border of the keyhole Wise pattern was made and carried down  through the skin and subcutaneous tissue.  The tissue surrounding the  Magseed and clip was dissected out using Metzenbaum scissors  and  electrocautery.  Hemostasis was achieved using electrocautery.  The  specimen was oriented and a specimen mammogram confirmed the presence of  both the [facility] as well as the original hook clip with a generous border  of normal tissue all the way around.  Additional deep tissue was also  excised to ensure adequate negative margin.  Meanwhile, the wound was  irrigated.  Hemostasis was checked for and achieved using electrocautery.   The patient was then turned over to Dr. Sharen Heck for the reconstruction  portion of surgery, and this will be dictated by him.           D:  10/14/2019 10:03 AM by Dr. Rochele Raring. Leopold Smyers, MD [phone])  T:  10/14/2019 12:03 PM by NTS      Everlean Cherry: [id number]) (Doc ID: [id number])

## 2019-10-14 NOTE — Op Note (Signed)
Dictated

## 2019-10-14 NOTE — Discharge Instr - AVS First Page (Addendum)
[facility]  Henderson Baltimore, MD, FACS        Admit Date: 10/14/2019   Discharge Date: 10/14/2019     Attending: Boykin Peek, MD   Surgeon: Surgeon(s):  Mesbahi, Lilly Cove, MD  Boykin Peek, MD      Procedures:  Procedure(s) (LRB):  BIOPSY, BREAST, TUMOR EXCISION, ULTRASOUND NEEDLE LOCALIZATION (Left)  BIOPSY, SENTINEL NODE (Left)  REMOVAL, MEDIPORT (Right)  MAMMOPLASTY/ONCOPLASTY (Left)  MAMMOPLASTY, REDUCTION (MEDICAL) (Right)  BREAST, LUMPECTOMY (Left)    Discharge Instructions after Breast Surgery    1.  The dressing is not water proof.  Remove the outer dressing in 48 hours.   You may shower 48 hours after surgery after remove the dressing.  Wash the incision gently with warm water and mild soap and pat the area dry.  You do not need to redress the incision.    2.  Steri-strips are strips of white tape used to close the incision.  It is okay to shower with these on.  Do not pull them off; they will fall of on their own.    3.  Wear a comfortable bra at all times, even to bed, this helps to keep swelling down.    4. You may return to your regular diet as you feel able.  A healthy well-balanced diet is encouraged and drink lots of fluid.    5. Take pain reliever as directed.  For mild pain you may switch to extra strength Tylenol or Advil.  Do NOT take Tylenol in addition to the prescribed pain medication, as it already contains Tylenol and you do not want to overdose    6. You may resume your previous medications, unless otherwise indicated by your surgeon or primary care physician.    7.  Bruising and mild swelling is common after surgery.  Use an ice pack or bag of frozen peas wrapped in a cloth over the incision for no more than 20 minutes at a time.  Do this as needed.    8. You may resume driving once you are no longer using the prescribed pain medication.  Do not drive if you have a surgical drain.    9.  NEVER drink alcohol or drive a vehicle while taking pain medication.    10.  Do not lift more than 10 pounds.  It is OK to walk up and down stairs.  Keep your activity light until returning for your post operative visit.    11. Pain medication can be constipating.  Take an over the counter stool softener as needed and maintain good hydration.  Drink at least 6-8 glasses of non-caffeinated beverages a day.    12. You will need to come in for a follow up appointment within 1-2 weeks after surgery.  If you do not already have this appointment scheduled with your surgeon, please call the office to do so.  At this visit, your pathology report should be available and will be discussed.    13. If you had a sentinel lymph node biopsy:Your urine will be bluish in color after surgery. Your skin may also have a faint bluish color. This will go away as the dye clears from your body. Staying hydrated is important to help flush your system.      14. Please feel free to contact the office for any questions or concerns. (Office-[phone]).  Dr. Ardeen Garland Cell Number is [phone]- only for emergencies or if you are unable to contact the office.  Possible Reasons to call the office:  Fever over 101 F  Redness, drainage or warmth around the incision site  Separation of the skin at the incision site  Pain that is not relieved with the pain medication  Chest pain or shortness of breath      RESUME ELIQUIS TOMORROW MORNING.    GENERAL DISCHARGE INSTRUCTIONS    You may not drive or do anything requiring coordination or balance for 24 hours.  Rest for the rest of the day.  Avoid heavy lifting for 2 weeks after any surgery.    You may not drink alcohol or consume non-prescribed sedatives or tranquilizers for 24 hours unless approved by your physician.    You should not sign important papers or make important decisions in the next 24 hours.    Please have someone responsible with you the first night you are home.    Keep your dressing/wound site clean and dry.  Do not remove the dressing until advised by  your physician.  Wash your hands frequently before and after touching your surgical site.    Begin your diet with clear liquids and progress to your normal diet as long as you are not nauseated. It is suggested that you avoid greasy or spicy foods.    It is suggested that unless specified by the pharmacist, you take all medications with food.  All narcotic type pain medications can cause constipation, keep fluid intake up and increase fiber in your diet.    Things to call your surgeon for:  Persistent Nausea and Vomiting  Chills or a Fever above 101 degrees F  Persistent bleeding, swelling or pus at the operative site  Unable to urinate in 8 hours  Pain that is not relieved by the pain medication.

## 2019-10-14 NOTE — Brief Op Note (Signed)
BRIEF OP NOTE    Date Time: 10/14/19 11:39 AM    Patient Name:   Leslie [name]    Date of Operation:   10/14/2019    Providers Performing:   Surgeon(s):  Kourtlyn Charlet, Lilly Cove, MD  Cyndi Bender Rochele Raring, MD  Lanell Persons, [place]    Assistant (s):   Circulator: Mckinley Jewel, RN  Relief Circulator: Danelle Berry, RN  Relief Scrub: Wilburt Finlay  Scrub Person: Livingston Diones  First Assistant: Phoebe Sharps  Second Scrub Person: Wilburt Finlay    Operative Procedure:   Procedure(s):  BIOPSY, SENTINEL NODE  REMOVAL, MEDIPORT  MAMMOPLASTY/ONCOPLASTY  MAMMOPLASTY, REDUCTION (MEDICAL)  BREAST, LUMPECTOMY, WITH MAG SEED LOCALIZATION    Preoperative Diagnosis:   Pre-Op Diagnosis Codes:     * Malignant neoplasm of left female breast, unspecified estrogen receptor status, unspecified site of breast [C50.912]    Postoperative Diagnosis:   Post-Op Diagnosis Codes:     * Malignant neoplasm of left female breast, unspecified estrogen receptor status, unspecified site of breast [C50.912]    Anesthesia:   General    Estimated Blood Loss:    * No values recorded between 10/14/2019  8:00 AM and 10/14/2019 11:39 AM *    Implants:   * No implants in log *    Drains:   Drains: Yes,     Specimens:     ID Type Source Tests Collected by Time Destination   A : Left Axillary Lymph Node #1 Axillary Lymph Node SURGICAL PATHOLOGY Boykin Peek, MD 10/14/2019 [phone]    B : LEFT LOWER OUTER BREAST LESION  With Margins Breast SURGICAL PATHOLOGY Boykin Peek, MD 10/14/2019 [phone]    C : LEFT BREAST FINAL DEEP MARGIN  With Margins Breast SURGICAL PATHOLOGY Boykin Peek, MD 10/14/2019 [phone]    D : RIGHT BREAST REDUCTION  Reduction Breast SURGICAL PATHOLOGY Felipe Drone, MD 10/14/2019 1015    E : LEFT BREAST REDUCTION  Reduction Breast SURGICAL PATHOLOGY Felipe Drone, MD 10/14/2019 1127          Findings:   na    Complications:   none      Signed by: Felipe Drone, MD                                                                            [place] [facility]

## 2019-10-14 NOTE — Transfer of Care (Signed)
Anesthesia Transfer of Care Note    Patient: Leslie [name]    Procedures performed: Procedure(s) with comments:  BIOPSY, SENTINEL NODE  REMOVAL, MEDIPORT  MAMMOPLASTY/ONCOPLASTY - Right Wt: 1318g  Left Wt: 1319g  MAMMOPLASTY, REDUCTION (MEDICAL)  BREAST, LUMPECTOMY, WITH MAG SEED LOCALIZATION    Anesthesia type: General ETT    Patient location:Phase I PACU    Last vitals:   Vitals:    10/14/19 0624   BP: 139/88   Pulse: (!) 115   Resp: 20   Temp: (!) 35.8 C (96.4 F)   SpO2: 100%       Post pain: Patient not complaining of pain, continue current therapy      Mental Status:sedated    Respiratory Function: tolerating face mask    Cardiovascular: stable    Nausea/Vomiting: patient not complaining of nausea or vomiting    Hydration Status: adequate    Post assessment: no apparent anesthetic complications    Signed by: Bethanie Dicker Smith-Johnson  10/14/19 12:11 PM

## 2019-10-14 NOTE — Brief Op Note (Signed)
BRIEF OP NOTE    Date Time: 10/14/19 9:55 AM    Patient Name:   Leslie [name]    Date of Operation:   10/14/2019    Providers Performing:   Surgeon(s):  Mesbahi, Lilly Cove, MD  Boykin Peek, MD    Assistant (s):   Circulator: Mckinley Jewel, RN  Relief Circulator: Danelle Berry, RN  Scrub Person: Livingston Diones  First Assistant: Phoebe Sharps  Second Scrub Person: Wilburt Finlay    Operative Procedure:   Left Partial Segmental Mastectomy with MagSeed localization  Left Axillary Sentinel Lymph Node Biopsy  Removal of Right IJ Mediport    Preoperative Diagnosis:   Pre-Op Diagnosis Codes:     * Malignant neoplasm of left female breast, unspecified estrogen receptor status, unspecified site of breast [C50.912]    Postoperative Diagnosis:   Post-Op Diagnosis Codes:     * Malignant neoplasm of left female breast, unspecified estrogen receptor status, unspecified site of breast [C50.912]    Anesthesia:   Choice    Estimated Blood Loss:    * No values recorded between 10/14/2019  8:00 AM and 10/14/2019  9:55 AM *    Implants:   * No implants in log *    Drains:   Drains: Yes, as per plastics    Specimens:     ID Type Source Tests Collected by Time Destination   A : Left Axillary Lymph Node #1 Axillary Lymph Node SURGICAL PATHOLOGY Boykin Peek, MD 10/14/2019 [phone]    B : LEFT LOWER OUTER BREAST LESION  With Margins Breast SURGICAL PATHOLOGY Boykin Peek, MD 10/14/2019 [phone]    C : LEFT BREAST FINAL DEEP MARGIN  With Margins Breast SURGICAL PATHOLOGY Boykin Peek, MD 10/14/2019 [phone]          Findings:   SLN negative on frozen  MagSeed and non-hydromark  Clip in specimen    Complications:   None      Signed by: Boykin Peek, MD                                                                           [place] [facility]

## 2019-10-17 ENCOUNTER — Encounter: Payer: Self-pay | Admitting: Surgery

## 2019-10-19 LAB — LAB USE ONLY - HISTORICAL SURGICAL PATHOLOGY

## 2019-10-20 ENCOUNTER — Telehealth (HOSPITAL_BASED_OUTPATIENT_CLINIC_OR_DEPARTMENT_OTHER): Payer: Self-pay

## 2019-10-20 ENCOUNTER — Other Ambulatory Visit (HOSPITAL_BASED_OUTPATIENT_CLINIC_OR_DEPARTMENT_OTHER): Payer: Self-pay | Admitting: Surgery

## 2019-10-20 ENCOUNTER — Ambulatory Visit
Admission: RE | Admit: 2019-10-20 | Discharge: 2019-10-20 | Disposition: A | Discharge status: Home / Self Care | Payer: Commercial Managed Care - POS | Source: Ambulatory Visit | Attending: Surgery | Admitting: Surgery

## 2019-10-20 DIAGNOSIS — C50412 Malignant neoplasm of upper-outer quadrant of left female breast: Secondary | ICD-10-CM | POA: Insufficient documentation

## 2019-10-20 DIAGNOSIS — Z171 Estrogen receptor negative status [ER-]: Secondary | ICD-10-CM

## 2019-10-20 DIAGNOSIS — C50912 Malignant neoplasm of unspecified site of left female breast: Secondary | ICD-10-CM

## 2019-10-20 NOTE — Telephone Encounter (Signed)
See documentation of patient follow up appts

## 2019-10-21 ENCOUNTER — Ambulatory Visit (INDEPENDENT_AMBULATORY_CARE_PROVIDER_SITE_OTHER): Payer: Commercial Managed Care - POS | Admitting: Physician Assistant

## 2019-10-21 ENCOUNTER — Encounter (HOSPITAL_BASED_OUTPATIENT_CLINIC_OR_DEPARTMENT_OTHER): Payer: Self-pay | Admitting: Physician Assistant

## 2019-10-21 VITALS — BP 138/80 | HR 78 | Temp 97.4°F | Resp 18 | Wt 271.0 lb

## 2019-10-21 DIAGNOSIS — C50412 Malignant neoplasm of upper-outer quadrant of left female breast: Secondary | ICD-10-CM

## 2019-10-21 DIAGNOSIS — Z171 Estrogen receptor negative status [ER-]: Secondary | ICD-10-CM

## 2019-10-24 ENCOUNTER — Encounter: Payer: Self-pay | Admitting: Internal Medicine

## 2019-10-24 ENCOUNTER — Encounter: Payer: Self-pay | Admitting: Physician Assistant

## 2019-10-24 NOTE — Progress Notes (Signed)
[facility]  CANCER POST OPERATIVE VISIT NOTE  Treatment Team  Ref Prov: Satira Mccallum, MD   Primary Care Provider:   Satira Mccallum, MD Radiology facility: [facility])--[phone] Breast Surgeon : Henderson Baltimore MD   Plastic Surgeon Joaquim Nam, MD [phone] Radiation Onc:  {Stella Hetelekidis MD:  [phone] Morene Antu Jaconita); Medical Onc:  [name], MD      Breast Cancer Comprehensive Care Plan Summary  Date of diagnosis: 03/2019 Event : Primary Breast Cancer ECOG Performance: Grade 0  (Fully active) Genetic Testing :genetics consultation requested   Presentation : palpable breast lump Side: left Focality :  unifocal Location: radian 3:00   Biologic Tumor characteristics  Tumor: Invasive Ductal Carcinoma Grade: nuclear grade III Ki-67:  80 % Oncotype Dx:  not indicated    Estrogen Receptor: negative Progesterone Receptor: negative Her-2-neu Receptor: 1+ , negative    Staging  Tumor Size:   4.0 cm Nodes: clinically/US negative Systemic Metastases: clinically negative Stage: Stage IIA , T2 N0 M0   Treatment  Modality Date    Surgery       Radiation     Endocrine     Chemotherapy   neoadjuvant chemotherapy   Biologic     Clinical trial               Subjective:  Ms. Leslie [name] had left lumpectomy with SLNbx and right breast reduction  8/ 27/2 1   . She reports discomfort at her drain sites. Denies fever, chills, trouble breathing, bleeding, drainage, bruising or erythema at surgical incisions.   Came from seeing plastic surgery today and drains not ready to be pulled, yet .  Activity is fair. She is tolerating her diet well. Pathology results as below.    Physical Exam:  BP 138/80 (BP Site: Left arm, Patient Position: Sitting, Cuff Size: Large)    Pulse 78    Temp 97.4 F (36.3 C) (Temporal)    Resp 18    Wt 122.9 kg (271 lb)    SpO2 99%    BMI 40.02 kg/m      Breast exam: left lumpectomy with axillary incision and bilateral breast redutction    Surgical site clean, dry, intact. No hematoma, no erythema or deshicence.      Assessment and Plan:  1. ypT0 yN0 (sn) left invasive ductal carcinoma     Clinically, she is doing well.   Incisions are healing well without signs of infection or there concerns.   JP drain sites clear.          We have reviewed the results of the pathology with the patient evident for no residual invasive carcinoma breast after pre - surgical therapy and no lymph node metastaes ( 0/2) .  I have given the patient a copy of the pathology report.  Pathology reviewed with Dr. Cyndi Bender    Plan:   At this point, I would recommend that we set her up for a follow up  Bilateral  mammogram in 6  Months.Order given   She is also encouraged to follow up with medical oncology   She is also going to continue to  follow up with plastic surgery (they will direct drain removal )   She ought to follow up in the office in 6 months after her mammogram     Reviewed reasons to call and return for post operative care:   Pain is not relieved by medication   Fever greater  than 100?F or chills   Persistent bleeding or drainage from your incision   Persistent swelling   Increasing redness of around the incision     PATHOLOGY  SURGICAL PATHOLOGY REPORT    DIAGNOSIS:       A. LEFT AXILLARY SENTINEL LYMPH NODE #1, BIOPSY:    NEGATIVE FOR METASTATIC CARCINOMA IN TWO LYMPH NODES (0/2)       B. LEFT BREAST LOWER OUTER QUADRANT LESION, LUMPECTOMY:      1. FOCAL ATYPICAL DUCTAL HYPERPLASIA      2. BIOPSY SITE SURROUNDED BY ZONE OF PAUCICELLULAR STROMA    WITH INTERSTITIAL FOAMY MACROPHAGES, COMPATIBLE WITH    THERAPY-INDUCED TUMOR REGRESSION      3. NEGATIVE FOR MALIGNANCY       C. LEFT BREAST FINAL DEEP MARGIN, EXCISION:      1. BREAST PARENCHYMA WITH NO SPECIFIC PATHOLOGIC ALTERATION      2. NEGATIVE FOR ATYPIA OR MALIGNANCY       D. RIGHT BREAST, REDUCTION:      1. BREAST PARENCHYMA WITH NO SPECIFIC PATHOLOGIC  ALTERATION      2. NEGATIVE FOR ATYPIA OR MALIGNANCY       E. LEFT BREAST, REDUCTION:      1. BREAST PARENCHYMA WITH NO SPECIFIC PATHOLOGIC ALTERATION      2. NEGATIVE FOR ATYPIA OR MALIGNANCY       SYNOPTIC REPORT:       PROCEDURE: LUMPECTOMY       SPECIMEN LATERALITY: LEFT       TUMOR SITE: LOWER OUTER QUADRANT       INVASIVE CARCINOMA: NO RESIDUAL INVASIVE CARCINOMA AFTER NEOADJUVANT    THERAPY       DUCTAL CARCINOMA IN SITU (DCIS): NOT IDENTIFIED       LOBULAR CARCINOMA IN SITU (LCIS): NOT IDENTIFIED       REGIONAL LYMPH NODES:    -NUMBER OF LYMPH NODES INVOLVED BY TUMOR CELLS: 0    -NUMBER OF LYMPH NODES EXAMINED: 2 SENTINEL LYMPH NODES       LYMPHOVASCULAR INVASION: NOT IDENTIFIED       TREATMENT EFFECT:    -BREAST: NO RESIDUAL INVASIVE CARCINOMA IS PRESENT IN THE BREAST     AFTER PRE-SURGICAL THERAPY    -LYMPH NODES: NO LYMPH NODE METASTASES AND NO FIBROUS SCARRING OR     HISTIOCYTIC AGGREGATES IN THE NODES       PATHOLOGIC STAGE (AJCC 8TH EDITION): ypT0 yN0(sn)       MICROCALCIFICATIONS: PRESENT, ASSOCIATED WITH BENIGN DUCTS       ADDITIONAL PATHOLOGIC FINDINGS: BIOPSY SITE; FOCAL ATYPICAL DUCTAL    HYPERPLASIA        [facility]  [facility]    [address]  [place] [address]  T [phone]

## 2019-10-25 ENCOUNTER — Encounter (INDEPENDENT_AMBULATORY_CARE_PROVIDER_SITE_OTHER): Payer: Self-pay | Admitting: Family Medicine

## 2019-10-25 ENCOUNTER — Encounter (HOSPITAL_BASED_OUTPATIENT_CLINIC_OR_DEPARTMENT_OTHER): Payer: Self-pay

## 2019-10-25 ENCOUNTER — Other Ambulatory Visit (INDEPENDENT_AMBULATORY_CARE_PROVIDER_SITE_OTHER): Payer: Self-pay | Admitting: Family Medicine

## 2019-10-25 DIAGNOSIS — C50912 Malignant neoplasm of unspecified site of left female breast: Secondary | ICD-10-CM | POA: Insufficient documentation

## 2019-10-25 DIAGNOSIS — I10 Essential (primary) hypertension: Secondary | ICD-10-CM

## 2019-10-26 ENCOUNTER — Telehealth (INDEPENDENT_AMBULATORY_CARE_PROVIDER_SITE_OTHER): Payer: Self-pay | Admitting: Family Medicine

## 2019-10-26 DIAGNOSIS — I10 Essential (primary) hypertension: Secondary | ICD-10-CM

## 2019-10-26 MED ORDER — LOSARTAN POTASSIUM-HCTZ 100-25 MG PO TABS
1.0000 | ORAL_TABLET | Freq: Every day | ORAL | 1 refills | Status: DC
Start: 2019-10-26 — End: 2020-03-19

## 2019-10-26 NOTE — Telephone Encounter (Signed)
Refill Request:    Last Office Visit (Less than 6 months): 09-26-2019  (If greater than 6 months- NEEDS OV SCHEDULED)  Scheduled or declined?0    Reason for declining OV0  Prescriber: KC  Medication:   Name: Losartan HCTZ   Dose: 100-25mg    Directions: 0  How many days left of meds: 0  Pharmacy: [facility]  Comments: 0

## 2019-10-26 NOTE — Telephone Encounter (Signed)
See mychart message from patient.  Nurse called patient and sent new Rx due to original Rx was lost in mail

## 2019-10-28 ENCOUNTER — Encounter (HOSPITAL_BASED_OUTPATIENT_CLINIC_OR_DEPARTMENT_OTHER): Payer: Self-pay

## 2019-10-28 ENCOUNTER — Encounter: Payer: Self-pay | Admitting: Internal Medicine

## 2019-10-31 ENCOUNTER — Ambulatory Visit: Payer: Commercial Managed Care - POS | Attending: Internal Medicine | Admitting: Internal Medicine

## 2019-10-31 ENCOUNTER — Encounter: Payer: Self-pay | Admitting: Internal Medicine

## 2019-10-31 ENCOUNTER — Telehealth: Payer: Self-pay | Admitting: Internal Medicine

## 2019-10-31 ENCOUNTER — Encounter: Payer: Self-pay | Admitting: Radiation Oncology

## 2019-10-31 DIAGNOSIS — Z171 Estrogen receptor negative status [ER-]: Secondary | ICD-10-CM

## 2019-10-31 DIAGNOSIS — E559 Vitamin D deficiency, unspecified: Secondary | ICD-10-CM

## 2019-10-31 DIAGNOSIS — D539 Nutritional anemia, unspecified: Secondary | ICD-10-CM

## 2019-10-31 DIAGNOSIS — I82621 Acute embolism and thrombosis of deep veins of right upper extremity: Secondary | ICD-10-CM

## 2019-10-31 DIAGNOSIS — C50412 Malignant neoplasm of upper-outer quadrant of left female breast: Secondary | ICD-10-CM

## 2019-10-31 MED ORDER — DULOXETINE HCL 30 MG PO CPEP
30.0000 mg | ORAL_CAPSULE | Freq: Every day | ORAL | 0 refills | Status: DC
Start: 2019-10-31 — End: 2019-11-01

## 2019-10-31 NOTE — Addendum Note (Signed)
Addended by: Selena Batten on: 10/31/2019 04:33 PM     Modules accepted: Orders

## 2019-10-31 NOTE — Progress Notes (Signed)
[facility]  [address] [name]  [phone]      [name] Office  [phone]  Visit Date: 10/31/2019    Chief Complaint   Patient presents with    Follow-up     contination of managed care for breast cancer, pain level 2 both breasts  both sides and under them         HISTORY OF PRESENT ILLNESS:  Presents to clinic today for path review. Did miss visit with rad onc as she was not aware of the appointment.   Continues to have neuropathy in hands and feet. Does not sleep very well at night. She is not taking B12 and iron     Oncology History Overview Note   Physician Requesting Consult: de Lawson Radar, Dairl Ponder, MD    Breast Surgeon: Orlene Och, MD  Radiation Oncologist:  Plastic Surgeon:   PCP: Satira Mccallum, MD      Leslie [name] is a 49 y.o.-year-old female with newly diagnosed IDC of the left breast.   She presented with abnormal mammogram.     Bilateral screening mammogram 02/2019 showed left breast 3.1 x 2 cm hypoechoic mass and midly enlarged axillary node abnormality.     CNB left breast mass 2 OC and left axillary node 03/07/2019 [facility]) showed:   A) Breast: IDC, G3, ER 0%, PR 0%, Her 2 neu negative (1+) by IHC, Ki 67 80%  B) B) Lymph node, left axillary: benign-appearing lymph node.    Bilateral breast MRI and Genetic consult ordered. Path review pending.       Breast Cancer Risk Factors:  Age at Menarche:  50  Age at 64st FTP:  87    Still menstruating?:  No   Last menses: 2008   Hysterectomy: Yes 2008 TAH+ left oophorectomy   Estrogen Therapy:          Oral Contraceptives: No           Hormone Replacement Therapy:  No         Reproductive Assistance Therapies:  No  Family History of Cancer:            Breast:   Yes maternal aunt age 71, maternal cousin age 44, paternal cousin age 75,       Ashkenazi Jewish Ancestry: No     Genetic testing: pending       Denies F/C/N/V/diarrhea. No CP/SOB. No HA/visual changes/focal weakness.        Malignant neoplasm of upper  outer quadrant of breast in female, estrogen receptor negative   03/29/2019 Initial Diagnosis    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, estrogen receptor negative     04/14/2019 - 06/22/2019 Chemotherapy    cyclophosphamide (CYTOXAN) 1,500 mg in sodium chloride 0.9 % 360 mL chemo infusion, 1,554 mg, Intravenous, Once, 4 of 4 cycles  Administration: 1,500 mg (04/14/2019), 1,500 mg (04/28/2019), 1,500 mg (05/12/2019), 1,500 mg (06/09/2019)  pegfilgrastim (NEULASTA) injection 6 mg, 6 mg, Subcutaneous, Once, 2 of 2 cycles  Administration: 6 mg (04/14/2019), 6 mg (04/29/2019)  pegfilgrastim (NEULASTA) delivery kit 6 mg, 6 mg, Subcutaneous, Once, 3 of 3 cycles  Administration: 6 mg (04/28/2019), 6 mg (05/12/2019), 6 mg (06/09/2019)  DOXOrubicin (ADRIAMYCIN) 155 mg in sodium chloride 0.9 % 362.5 mL chemo infusion, 60 mg/m2 = 155 mg, Intravenous, Once, 4 of 4 cycles  Administration: 155 mg (04/14/2019), 155 mg (04/28/2019), 155 mg (05/12/2019), 155 mg (06/09/2019)  06/23/2019 -  Chemotherapy    CARBOplatin (PARAPLATIN) 225 mg in sodium chloride 0.9 % 307.5 mL chemo infusion, 225 mg, Intravenous, Once, 2 of 2 cycles  Administration: 225 mg (06/23/2019), 225 mg (06/30/2019), 225 mg (07/07/2019), 225 mg (07/14/2019)  PACLitaxel (TAXOL) 210 mg in sodium chloride 0.9 % 320 mL chemo infusion, 80 mg/m2 = 210 mg, Intravenous, Once, 4 of 4 cycles  Dose modification: 64 mg/m2 (80 % of original dose 80 mg/m2, Cycle 4, Reason: Dose Not Tolerated)  Administration: 210 mg (06/23/2019), 210 mg (06/30/2019), 210 mg (07/07/2019), 210 mg (07/14/2019), 210 mg (07/28/2019), 210 mg (08/04/2019), 210 mg (08/11/2019), 210 mg (08/18/2019), 210 mg (08/25/2019), 210 mg (09/01/2019), 168 mg (09/15/2019)       Cancer Staging  No matching staging information was found for the patient.    Past Medical History:   Diagnosis Date    Anemia     iron deficient, recent chemo//takes po iron supplement    Clot     mediport site r chest, on elliquis    Fibroid      Gastroesophageal reflux disease     indigestion while on chemo    Herpes simplex without mention of complication 2003    possible recurrance in 2013//no issuses since    Hypertension     well controlled w/ medication    Lymphedema of extremity 11/03/2017    L foot    Malignant neoplasm of breast 02/2019    L breast ca, s/p chemo 08/2019, no radiation    Neurologic disorder     brachial neuritis    Neuropathy     recednt chemo/hands/feet    Obesity     Vitamin D deficiency      Past Surgical History:   Procedure Laterality Date    BIOPSY, SENTINEL NODE Left 10/14/2019    Procedure: BIOPSY, SENTINEL NODE;  Surgeon: Boykin Peek, MD;  Location: [place] [facility];  Service: General;  Laterality: Left;    BREAST, LUMPECTOMY Left 10/14/2019    Procedure: BREAST, LUMPECTOMY, WITH MAG SEED LOCALIZATION;  Surgeon: Boykin Peek, MD;  Location: [place] [facility];  Service: General;  Laterality: Left;    HYSTERECTOMY  2008    fibroids, readmitted for pelvic abscess formation POD#3    MAMMOPLASTY, REDUCTION (MEDICAL) Right 10/14/2019    Procedure: MAMMOPLASTY, REDUCTION (MEDICAL);  Surgeon: Felipe Drone, MD;  [name];  Service: Plastics;  Laterality: Right;    MAMMOPLASTY/ONCOPLASTY Left 10/14/2019    Procedure: MAMMOPLASTY/ONCOPLASTY;  Surgeon: Felipe Drone, MD;  Location: [place] [facility];  Service: Plastics;  Laterality: Left;  Right Wt: 1318g  Left Wt: 1319g    MEDIPORT CHECK Right 06/08/2019    Procedure: MEDIPORT CHECK;  Surgeon: Rex Kras, MD;  [facility];  Service: Interventional Radiology;  Laterality: Right;    MEDIPORT PLACEMENT N/A 04/08/2019    Procedure: MEDIPORT PLACEMENT;  Surgeon: Pollyann Kennedy, MD;  Location: [facility];  Service: Interventional Radiology;  Laterality: N/A;    OTHER SURGICAL HISTORY  05/04/2015    Obalon balloon - have 3 balloons placed in stomach for weight loss, Dr. Georgann Housekeeper    REMOVAL, MEDIPORT Right 10/14/2019    Procedure: REMOVAL,  MEDIPORT;  Surgeon: Boykin Peek, MD;  Location: [place] [facility];  Service: General;  Laterality: Right;    SALPINGOOPHORECTOMY Right 2008    RSO with hysterectomy    TUBAL LIGATION  02/17/1997     Social  History     Socioeconomic History    Marital status: Legally Separated     Spouse name: None    Number of children: None    Years of education: None    Highest education level: None   Occupational History    None   Tobacco Use    Smoking status: Never Smoker    Smokeless tobacco: Never Used   Vaping Use    Vaping Use: Never used   Substance and Sexual Activity    Alcohol use: Not Currently    Drug use: Not Currently     Comment: CBD oil/last use month ago    Sexual activity: Yes     Partners: Male     Birth control/protection: Surgical   Other Topics Concern    None   Social History Narrative    Has elliptical machine        Lives with boyfriend, daughter home        Son died on 02-03-2019 in CCU            Mom staying with her, good support system 05/30/2019     Social Determinants of Health     Financial Resource Strain:     Difficulty of Paying Living Expenses:    Food Insecurity:     Worried About Programme researcher, broadcasting/film/video in the Last Year:     Barista in the Last Year:    Transportation Needs:     Freight forwarder (Medical):     Lack of Transportation (Non-Medical):    Physical Activity: Insufficiently Active    Days of Exercise per Week: 7 days    Minutes of Exercise per Session: 20 min   Stress:     Feeling of Stress :    Social Connections:     Frequency of Communication with Friends and Family:     Frequency of Social Gatherings with Friends and Family:     Attends Religious Services:     Active Member of Clubs or Organizations:     Attends Engineer, structural:     Marital Status:    Intimate Partner Violence:     Fear of Current or Ex-Partner:     Emotionally Abused:     Physically Abused:     Sexually Abused:       Family History   Problem  Relation Age of Onset    Hypertension Mother     Coronary artery disease Mother     Heart disease Mother     Hyperlipidemia Mother     Hypertension Father     Diabetes Father     Other Father         cardiac catheterization    Hypertension Sister     Breast cancer Maternal Aunt     Uterine cancer Maternal Aunt     Vaginal cancer Maternal Aunt     Breast cancer Other         maternal cousin    Ovarian cancer Neg Hx        Allergies   Allergen Reactions    Valsartan Other (See Comments)     achiness    Penicillins Hives     Symptoms occurred as child  Onset date: 07-28-2004     has a current medication list which includes the following prescription(s): apixaban, losartan-hydrochlorothiazide, omeprazole, clindamycin, clotrimazole, dexamethasone, dexlansoprazole, duloxetine, folic acid, gabapentin, ibuprofen, iron-vitamin c, lidocaine-prilocaine, nystatin, olanzapine, ondansetron, oxycodone, oxycodone-acetaminophen, potassium chloride, prochlorperazine,  tizanidine, tramadol, and vitamin b-12.        REVIEW OF SYSTEMS: 14 pt ROS reviewed with the patient and negative except for as documented in the HPI. All other systems reviewed.      PHYSICAL EXAM:  There were no vitals taken for this visit.    Performance Status:  0 - Fully active, able to carry on all pre-disease performance without restriction         General Appearance: Alert, cooperative, no distress, appears stated age  Head:  Normocephalic, without obvious abnormality, atraumatic  Eyes: PERRL, conjunctiva/corneas clear bilaterally  Neck: Supple, symmetrical, trachea midline, no adenopathy;   thyroid:  no enlargement/tenderness/nodules  Lungs: Clear to auscultation bilaterally, respirations unlabored  Chest Wall: No tenderness or deformity  Heart: Regular rate and rhythm, S1 and S2 normal, no murmur, rub or gallop  Breast Exam: Deferred  Abdomen: Soft, ttp RUQ, no masses, no organomegaly  Extremities: Extremities normal, atraumatic, no cyanosis or  edema  Lymph Nodes:  Cervical, supraclavicular, and axillary nodes normal      Labs Results:  Reviewed in epic today  Lab Results   Component Value Date    WBC 6.84 09/15/2019    HGB 10.2 (L) 09/15/2019    HCT 31.2 (L) 09/15/2019    MCV 95.1 09/15/2019    PLT 192 09/15/2019     Lab Results   Component Value Date    CREAT 1.2 09/15/2019    BUN 13.0 09/15/2019    NA 134 (L) 09/15/2019    K 4.1 09/15/2019    CL 106 09/15/2019    CO2 30 (H) 09/15/2019     Lab Results   Component Value Date    ALT 30 09/15/2019    AST 30 09/15/2019    ALKPHOS 82 08/18/2019    BILITOTAL 0.3 08/18/2019       Imaging Results: none this visit       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    C1D1 AC on 04/14/2019.   C4D1 AC on 06/09/2019.   Started carbotaxol on 06/23/2019. Was unable to tolerate.   Dropped carbo and continued with single agent paclitaxel starting with C2D8. Patient decided to stop chemotherapy with once one infusion left (09/15/2019) as she developed severe neuropathic pain hands and feet.     Followed by cardio-oncology.  BaselineECHO2/23/2021 showed EF 66% and GLS -19%.  ECHOpost C#2 AC 3/17/2021showed EF 64% and GLS -19%.  Echo post C4 AC- 4/19/ 21 showed EF 64% GLS -19. %  6 m post AC ECHO due 11/2019. Will order.       S/p left lumpectomy/SLNBx 10/14/2019. Final pathology showed ypT0, ypNo.   Recommended to reschedule appointment with rad onc. She is very reluctant about doing radiation.     RTC in 3 m       CIPN:   Rx duloxetine 30 mg today     Vitamin B12 deficiency    Rec'ed to start 1000 mcg sublingual) , iron sat low ( started Vitron-C once a day).  Repeat levels at RTC in 3 m     Iron deficiency   Rec'ed to start Vitron C   Repeat levels at RTC in 3 m     CVL associated DVT   PORT removed. Stop eliquis.     [name], MD    Total time spent on encounter  including face to face interaction with patient and non face to face activities including preparing to see the patient (review imaging and pathology records), obtaining and reviewing the separately obtained history, ordering medication, test, procedure, referring the communicated with other healthcare professionals, documenting clinical information sent electronically, independently interpreting results and communicating results with patient, family, caregiver, care conversation on the date of service was equal to 40 minutes.

## 2019-10-31 NOTE — Telephone Encounter (Signed)
Orders in chart.   Rx sent to pharmacy.     Front -  -ECHO 11/2019  -please schedule for referral for PT  -please schedule for referral for OT  -CT AP in 3 weeks   -please schedule for GI referral   -RTC in 3 months (CBC, Iron profile, ferritin, B12)  Thank you!

## 2019-10-31 NOTE — Progress Notes (Signed)
ERROR

## 2019-10-31 NOTE — Telephone Encounter (Signed)
Patient is scheduled for a 3 month f/u.     Patient states that she is okay to schedule on her own. Gave her all the appropriate orders and numbers. She is aware to call us if she needs assistance.

## 2019-10-31 NOTE — Telephone Encounter (Signed)
ECHO 11/2019  Rx duloxetine 30 mg  Referral to PT/OT for peripheral neuropathy  CT AP in 3 weeks (abd pain)   Referral Gi for colonoscopy    RTC 3 m with CBC, iron profile, ferritin, B12

## 2019-11-01 ENCOUNTER — Encounter: Payer: Self-pay | Admitting: Internal Medicine

## 2019-11-01 ENCOUNTER — Other Ambulatory Visit: Payer: Self-pay

## 2019-11-01 DIAGNOSIS — Z171 Estrogen receptor negative status [ER-]: Secondary | ICD-10-CM

## 2019-11-01 DIAGNOSIS — C50412 Malignant neoplasm of upper-outer quadrant of left female breast: Secondary | ICD-10-CM

## 2019-11-01 MED ORDER — DULOXETINE HCL 30 MG PO CPEP
30.0000 mg | ORAL_CAPSULE | Freq: Every day | ORAL | 0 refills | Status: DC
Start: 2019-11-01 — End: 2019-11-28

## 2019-11-07 ENCOUNTER — Encounter: Payer: Self-pay | Admitting: Internal Medicine

## 2019-11-09 ENCOUNTER — Encounter (INDEPENDENT_AMBULATORY_CARE_PROVIDER_SITE_OTHER): Payer: Self-pay | Admitting: Residents

## 2019-11-09 ENCOUNTER — Ambulatory Visit: Payer: Self-pay | Admitting: Radiation Oncology

## 2019-11-09 DIAGNOSIS — Z171 Estrogen receptor negative status [ER-]: Secondary | ICD-10-CM

## 2019-11-09 NOTE — Progress Notes (Signed)
11/09/19 1400   Comfort Alteration   Karnofsky Performance Score 90%-can perform normal activity, minor signs of disease   Fatigue 3   Pain Score 5   Pain Loc GENERALIZED   Pain Intervention 1-over-the-counter medications   Effectiveness of Pain Intervention 2-pain relieved 50%   Emotional Alteration   Coping 0-effective   Vital signs   Temp 97.7 F (36.5 C)   Temp Source Temporal   Heart Rate 95   Resp Rate 18   BP 111/68     Review of Systems   Constitutional: Positive for malaise/fatigue. Negative for chills, diaphoresis, fever and weight loss.   HENT: Negative.    Eyes: Negative.    Respiratory: Negative.    Cardiovascular: Negative.    Gastrointestinal: Negative.    Genitourinary: Negative.    Musculoskeletal: Positive for joint pain and myalgias. Negative for back pain, falls and neck pain.   Neurological: Positive for tingling, sensory change, focal weakness and weakness. Negative for dizziness, tremors, speech change, seizures, loss of consciousness and headaches.   Endo/Heme/Allergies: Negative.    Psychiatric/Behavioral: Negative.

## 2019-11-09 NOTE — Progress Notes (Signed)
RADIATION ONCOLOGY INITIAL CONSULTATION NOTE    Patient Name:  Leslie [name]  Medical Record Number:  [id number]  Encounter Date: November 09, 2019    Requesting Physician: Fuller Mandril, MD    Consulting Physicians:  1) [name] MD    Primary Care Provider:  Satira Mccallum, MD    DIAGNOSIS:     Leslie [name] is seen in consultation at the request of Dr. Cyndi Bender for evaluation of her:  1) triple negative breast cancer    ASSESSMENT:    Leslie [name] is a 49 y.o.  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  She is status post neoadjuvant chemotherapy followed by partial mastectomy/sentinel lymph node biopsy (pCR).  She presents today for evaluation and management of her disease..    Breast: Left quadrant: Lateral  Clockface: 3:00  distance from nipple: Not reported  TNM Stage (AJCC 8th Edition): cT2N0M0 (IIB)  ER: 0% PR: 0% Her2/neu: 1+, negative  Grade: 3 LVI: Unable to assess, PCR  Ki67: 80%  Oncotype Dx: N/A    RECOMMENDATIONS AND PLAN:     1. CANCER TREATMENT: We had a balanced discussion of the various treatment options for her left breast invasive ductal carcinoma, and the indications, risks and benefits of each. We specifically discussed surgery, radiotherapy and systemic therapy as summarized below:    Early Stage Invasive Carcinoma    I had an extensive discussion with Leslie [name] in consultation this afternoon regarding her presumed early stage breast cancer diagnosis. We reviewed in detail her medical history and recent workup including imaging and pathological data.    Staging  Initially, I began our discussion by reviewing the staging of breast cancer, including a review of TNM classification. Per the AJCC 8th edition, her disease is currently staged as cT2N0M0 (IIB).  She subsequently had a PCR after neoadjuvant chemotherapy.    For Leslie [name] specifically, the following procedures are  planned:  None.    Treatment Options  I then spent some time reviewing the standard of care management options for early stage invasive breast cancer per NCCN guidelines. These include breast preservation (lumpectomy with SLN Bx followed by adjuvant whole breast radiotherapy (WBRT) +/- boost) or total mastectomy with SLN Bx +/- reconstruction. In some favorable cases, accelerated partial breast irradiation (APBI) may replace WBRT, and for a small subset of patients with very favorable clinical factors and biology, lumpectomy alone with adjuvant endocrine therapy may be appropriate. I reviewed the data demonstrating an approximately 50% reduction in any breast cancer recurrence, including a 2/3 reduction in local recurrence, and an approximately 4% improvement in breast cancer survival for the addition of RT, per the EBCTCG meta-analysis.    I explained that in some cases, RT is indicated after mastectomy, including patients with T3 or greater primaries, positive margins or node positive disease. There are no indications at this point that that would be indicated.    I also reviewed the indications for regional nodal irradiation in the breast preservation setting. While many patients with 1-3 positive nodes on SLN Bx can avoid this (based on the results of ACOSOG Z11), this is often strongly considered in appropriately selected patients.    In the setting of partial mastectomy, I strongly recommend that the patient move forward with adjuvant radiotherapy, given her young age and triple negative histology.  She is not a candidate for partial breast irradiation based  on national guidelines.  At a minimum I would recommend tangents to treat the whole left breast.  In addition, given the bulk of disease upfront, her young age, the propensity of triple negative breast cancer to metastasize to the regional lymphatics, and the overall aggressiveness of this tumor, I would recommend comprehensive regional radiotherapy in  addition to reduce the chance of local regional recurrence.  I also explained the rationale for radiotherapy, and the purpose to treat micrometastatic disease.  While it is encouraging that the patient has received a pathological complete response to neoadjuvant chemotherapy, there is no randomized data demonstrating that it is safe to omit radiotherapy in these patients.    I would recommend using a breath hold technique to reduce cardiac and lung dose in this case.    The patient and her mother expressed significant reservations (and some understandable misconceptions) about proceeding with radiotherapy.  The patient is concerned about the total of the extensive treatments that she has received, and is concerned whether she could tolerate radiotherapy at this time.  There are also concerns about the effect of radiotherapy on normal tissues, particularly the skin, and the likelihood of fatigue.  I spent an extensive amount of time with the patient and her daughter reviewing these risks/benefits, the typical course of radiotherapy for the average patient, and the ways that we would support her she moves through treatment.    Based on her physical examination today, she is not quite ready to begin radiotherapy as there is some more healing required.  Also she has very poor range of motion in the bilateral shoulders.  I therefore provided a prescription for physical therapy, and asked the patient to return to clinic on November 21, 2019 for further discussion, and potential CT simulation.  At that time, she will make her decision regarding whether she would like to move forward with treatment.  I was very clear with her regarding the implications of foregoing radiotherapy, with an increased risk of recurrence, and the potential that any recurrence could be not curable, and could lead to her death.  She endorsed clear understanding.    Logistics and Planning  I then spent some time discussing the logistics and planning  of radiotherapy to include CT simulation with breath-hold technique.  This would be used to reduce dose to the lung and the heart. She would then proceed with radiation therapy, using a hypofractionated WBRT technique likely with a boost, delivered over approximately 56 weeks of daily treatments.    Side Effects and Long Term Risks  She could expect some mild fatigue, skin redness and irritation, blistering, soft tissue swelling, chest wall pain, itching in the radiation field, hair loss in radiation field in the short term. In the long term, there is an increased risk of bone fracture in field, a small risk of radiation pneumonitis and pulmonary fibrosis, a small risk of heart disease, and a small risk of damage to nerves. There can be skin/soft tissue changes, including thickening, and scarring in rare cases. There is also a small risk of secondary malignancy.    2. RADIATION PLANNING:  She was in agreement with our plan.  A CT Simulation will be performed in our clinic on November 21, 2019 to begin the planning process for the patient's radiation therapy.  We plan to treat the left breast and regional lymphatics to a dose of 50 Gy in 25 fractions, with a potential focused lumpectomy cavity boost.  The tentative start date will be  approximately 2 weeks after CT simulation.  She was given our contact information, and she was told to feel free to call or visit the clinic at anytime if she has any questions for concerns prior to her next visit.      3. INFORMED CONSENT: I explained in detail the risks, benefits, side effects, logistics, alternatives to and rationale of radiation treatments. The possibility of severe and permanent radiation damage to normal tissues within the radiated area was also discussed, as documented above. The patient had the opportunity to have all of her questions and concerns addressed regarding the diagnosis and proposed treatment.  Signed, witnessed consent was obtained and educational  material was provided.    4. PAIN ACTION PLAN: No intervention needed.      HISTORY OF PRESENTATION:   The history of present illness was obtained from extensive chart review, and in discussion with the patient and her family members in consultation this afternoon. This can be summarized as follows:    1.  The patient initially presented for her first mammogram.  Per the patient report, this demonstrated a mass in the left breast.  An ultrasound demonstrated a 3.1 x 2 cm hypoechoic mass, and a mildly enlarged left axillary lymph node.  2.  On March 07, 2019, the patient underwent an ultrasound-guided core needle biopsy of the left breast mass.  This revealed an invasive ductal carcinoma, grade 3, triple negative, Ki-67 80%.  In addition a core needle biopsy of the left axillary lymph node was negative.  3.  An MRI of the breast was performed on March 25, 2019.  The mass measured 4 x 1.9 x 1.9 cm without other significant findings.  4.  The patient was seen in consultation by Dr. Corie Chiquito and [name], who recommended neoadjuvant chemotherapy.  5.  The patient subsequently completed neoadjuvant chemotherapy under the care of Dr. Ladell Heads.  This was tolerated with significant toxicities, including severe neuropathy and fatigue.  6.  On September 12, 2019, the patient underwent a restaging breast MRI, showing resolution of disease.  7.  On October 14, 2019, the patient was taken to the operating room by Dr. Henderson Baltimore and Devona Konig for a left partial mastectomy and sentinel lymph node biopsy with oncoplastic reconstruction and a right reduction mammoplasty.  Pathology showed a pathological complete response, with a scarred tumor bed in the breast.  2 sentinel lymph nodes were negative, without evidence of treatment effect.    Leslie [name] confirms the details as mentioned above.  Overall, she is feeling significantly fatigued from her treatments.  Chemotherapy was very hard on her, with significant  neuropathy and fatigue.  She is healing well from her recent surgery without significant breast pain.  She has no infection, or drainage.  Her drains were removed after 3 weeks.  She will be seeing her plastic surgeon tomorrow.  She has very poor range of motion of the bilateral shoulders due to pain.  She has not started physical therapy.  She is overall feeling very worn out from her treatments.    PAST MEDICAL AND SURGICAL HISTORY:     Past Medical History:   Diagnosis Date    Anemia     iron deficient, recent chemo//takes po iron supplement    Clot     mediport site r chest, on elliquis    Fibroid     Gastroesophageal reflux disease     indigestion while on chemo    Herpes simplex without  mention of complication 2003    possible recurrance in 2013//no issuses since    Hypertension     well controlled w/ medication    Lymphedema of extremity 11/03/2017    L foot    Malignant neoplasm of breast 02/2019    L breast ca, s/p chemo 08/2019, no radiation    Neurologic disorder     brachial neuritis    Neuropathy     recednt chemo/hands/feet    Obesity     Vitamin D deficiency       Past Surgical History:   Procedure Laterality Date    BIOPSY, SENTINEL NODE Left 10/14/2019    Procedure: BIOPSY, SENTINEL NODE;  Surgeon: Boykin Peek, MD;  Location: [facility];  Service: General;  Laterality: Left;    BREAST, LUMPECTOMY Left 10/14/2019    Procedure: BREAST, LUMPECTOMY, WITH MAG SEED LOCALIZATION;  Surgeon: Boykin Peek, MD;  Location: [facility];  Service: General;  Laterality: Left;    HYSTERECTOMY  2008    fibroids, readmitted for pelvic abscess formation POD#3    MAMMOPLASTY, REDUCTION (MEDICAL) Right 10/14/2019    Procedure: MAMMOPLASTY, REDUCTION (MEDICAL);  Surgeon: Felipe Drone, MD;  Location: [name];  Service: Plastics;  Laterality: Right;    MAMMOPLASTY/ONCOPLASTY Left 10/14/2019    Procedure: MAMMOPLASTY/ONCOPLASTY;  Surgeon: Felipe Drone, MD;  Location:  [facility];  Service: Plastics;  Laterality: Left;  Right Wt: 1318g  Left Wt: 1319g    MEDIPORT CHECK Right 06/08/2019    Procedure: MEDIPORT CHECK;  Surgeon: Rex Kras, MD;  [facility];  Service: Interventional Radiology;  Laterality: Right;    MEDIPORT PLACEMENT N/A 04/08/2019    Procedure: MEDIPORT PLACEMENT;  Surgeon: Pollyann Kennedy, MD;  Location: [facility];  Service: Interventional Radiology;  Laterality: N/A;    OTHER SURGICAL HISTORY  05/04/2015    Obalon balloon - have 3 balloons placed in stomach for weight loss, Dr. Georgann Housekeeper    REMOVAL, MEDIPORT Right 10/14/2019    Procedure: REMOVAL, MEDIPORT;  Surgeon: Boykin Peek, MD;  Location: [facility];  Service: General;  Laterality: Right;    SALPINGOOPHORECTOMY Right 2008    RSO with hysterectomy    TUBAL LIGATION  02/17/1997        Prior Radiation Therapy:  No   Pacemaker:  No    Collagen Vascular Disease:  No  Pregnancy status: Not pregnant.    FAMILY HISTORY:     Family History   Problem Relation Age of Onset    Hypertension Mother     Coronary artery disease Mother     Heart disease Mother     Hyperlipidemia Mother     Hypertension Father     Diabetes Father     Other Father         cardiac catheterization    Hypertension Sister     Breast cancer Maternal Aunt     Uterine cancer Maternal Aunt     Vaginal cancer Maternal Aunt     Breast cancer Other         maternal cousin    Ovarian cancer Neg Hx        SOCIAL HISTORY:     Social History     Socioeconomic History    Marital status: Legally Separated     Spouse name: Not on file    Number of children: Not on file    Years of education:  Not on file    Highest education level: Not on file   Occupational History    Not on file   Tobacco Use    Smoking status: Never Smoker    Smokeless tobacco: Never Used   Vaping Use    Vaping Use: Never used   Substance and Sexual Activity    Alcohol use: Not Currently    Drug use: Not Currently     Comment: CBD oil/last  use month ago    Sexual activity: Yes     Partners: Male     Birth control/protection: Surgical   Other Topics Concern    Not on file   Social History Narrative    Has elliptical machine        Lives with boyfriend, daughter home        Son died on 01-25-2019 in CCU            Mom staying with her, good support system 05/30/2019     Social Determinants of Health     Financial Resource Strain:     Difficulty of Paying Living Expenses:    Food Insecurity:     Worried About Programme researcher, broadcasting/film/video in the Last Year:     Barista in the Last Year:    Transportation Needs:     Freight forwarder (Medical):     Lack of Transportation (Non-Medical):    Physical Activity: Insufficiently Active    Days of Exercise per Week: 7 days    Minutes of Exercise per Session: 20 min   Stress:     Feeling of Stress :    Social Connections:     Frequency of Communication with Friends and Family:     Frequency of Social Gatherings with Friends and Family:     Attends Religious Services:     Active Member of Clubs or Organizations:     Attends Engineer, structural:     Marital Status:    Intimate Partner Violence:     Fear of Current or Ex-Partner:     Emotionally Abused:     Physically Abused:     Sexually Abused:        BREAST CANCER RISK FACTORS:   Please see initial breast surgery notes.    MEDICATIONS:     Current Outpatient Medications:     losartan-hydrochlorothiazide (HYZAAR) 100-25 MG per tablet, Take 1 tablet by mouth daily, Disp: 90 tablet, Rfl: 1    Omeprazole (PRILOSEC PO), Take by mouth every morning, Disp: , Rfl:     vitamin B-12 (CYANOCOBALAMIN) 1000 MCG tablet, Take 1,000 mcg by mouth daily, Disp: , Rfl:     apixaban (ELIQUIS) 5 MG, Take 1 tablet (5 mg total) by mouth every 12 (twelve) hours, Disp: 60 tablet, Rfl: 3    clindamycin (CLEOCIN) 300 MG capsule, , Disp: , Rfl:     clotrimazole (MYCELEX) 10 MG troche, Take 1 tablet (10 mg total) by mouth 3 (three) times daily, Disp: 21  tablet, Rfl: 0    dexamethasone (DECADRON) 4 MG tablet, Take 8 mg (2 tabs) on days 2, 3, and 4 for breakfast and days 3 and 4 for lunch of each chemotherapy cycle., Disp: 40 tablet, Rfl: 0    dexlansoprazole 60 MG capsule, Take 1 capsule (60 mg total) by mouth daily, Disp: 60 capsule, Rfl: 1    DULoxetine (CYMBALTA) 30 MG capsule, Take 1 capsule (30 mg total) by mouth daily, Disp: 30 capsule,  Rfl: 0    folic acid (FOLVITE) 1 MG tablet, Take 1 mg by mouth daily, Disp: , Rfl:     gabapentin (NEURONTIN) 100 MG capsule, Take 3 capsules (300 mg total) by mouth 3 (three) times daily, Disp: 90 capsule, Rfl: 1    ibuprofen (ADVIL) 200 MG tablet, Take 400 mg by mouth daily, Disp: , Rfl:     Iron-Vitamin C (VITRON-C PO), Take by mouth daily, Disp: , Rfl:     lidocaine-prilocaine (EMLA) cream, Apply topically as needed (one hour prior to chemotherapy appointment), Disp: 30 g, Rfl: 3    nystatin (MYCOSTATIN) 100000 UNIT/ML suspension, Take 5 mLs (500,000 Units total) by mouth 4 (four) times daily, Disp: 60 mL, Rfl: 2    OLANZapine (ZyPREXA) 5 MG tablet, Take one tab at bedtime days 1,2,3, and 4 of each chemotherapy cycle, Disp: 30 tablet, Rfl: 0    ondansetron (Zofran ODT) 8 MG disintegrating tablet, Take 1 tablet (8 mg total) by mouth every 8 (eight) hours as needed for Nausea, Disp: 60 tablet, Rfl: 3    oxyCODONE (ROXICODONE) 5 MG immediate release tablet, , Disp: , Rfl:     oxyCODONE-acetaminophen (PERCOCET) 5-325 MG per tablet, , Disp: , Rfl:     potassium chloride (MICRO-K) 10 MEQ CR capsule, Take 1 capsule (10 mEq total) by mouth 2 (two) times daily, Disp: 14 capsule, Rfl: 0    prochlorperazine (COMPAZINE) 10 MG tablet, Take 1 tablet (10 mg total) by mouth every 6 (six) hours as needed (nausea), Disp: 30 tablet, Rfl: 3    tiZANidine (Zanaflex) 4 MG tablet, One tab every 8 hrs as needed sparingly to relax muscles, Disp: 30 tablet, Rfl: 0    traMADol (ULTRAM) 50 MG tablet, Take 50 mg by mouth every 6 (six)  hours as needed for Pain, Disp: , Rfl:     ALLERGIES:     Allergies   Allergen Reactions    Gabapentin Facial Swelling     Eye swelling    Valsartan Other (See Comments)     achiness    Penicillins Hives     Symptoms occurred as child  Onset date: 07-28-2004       REVIEW OF SYSTEMS:   Constitutional: Positive for malaise/fatigue. Negative for chills, diaphoresis, fever and weight loss.   HENT: Negative.    Eyes: Negative.    Respiratory: Negative.    Cardiovascular: Negative.    Gastrointestinal: Negative.    Genitourinary: Negative.    Musculoskeletal: Positive for joint pain and myalgias. Negative for back pain, falls and neck pain.   Neurological: Positive for tingling, sensory change, focal weakness and weakness. Negative for dizziness, tremors, speech change, seizures, loss of consciousness and headaches.   Endo/Heme/Allergies: Negative.    Psychiatric/Behavioral: Negative.        PHYSICAL EXAM:   Vital Signs for this encounter reviewed in EPIC including:   BP 111/68    Pulse 95    Temp 97.7 F (36.5 C) (Temporal)    Resp 18   Weight Monitoring 10/04/2019 10/14/2019 10/21/2019   Height 175.3 cm 175.3 cm -   Height Method Stated Stated -   Weight 127.007 kg 124.104 kg 122.925 kg   Weight Method Stated Standing Scale -   BMI (calculated) 41.4 kg/m2 40.5 kg/m2 -       PAIN SCORE: 0/10.    KPS: 100  ECOG Performance Status:  (0) Fully active, able to carry on all predisease performance without restriction  General:  Well-appearing woman,  in no acute distress, accompanied by her mother.  Neurologic:  No gross focal neurological deficits.  Psychiatric:  Alert and oriented X 3, thought content appropriate, euthymic.  Head:  Normocephalic, without obvious abnormality, atraumatic  Eyes:  Conjunctivae and sclerae clear without icterus.  Extraocular movement's intact.  ENT: Mask in place due to pandemic.  Neck:  Supple, symmetrical, trachea midline.  Lungs: Normal work of breathing, no wheezing or crackles audible.   Extremities:  No cyanosis or edema, warm and well-perfused.  Skin:  Skin color, texture, turgor normal. No rashes or lesions.    Breast: Performed with female chaperone.  The patient is status post bilateral reduction mammoplasty, with Wise pattern scars.  The scars are healing well, without any evidence of infection, open areas no drainage.  There remains significant ecchymosis of the right breast.  The breasts were not palpated due to recent surgery.  There is a healing left axillary scar, without issue.  Joints: Significant reduction in the range of motion of the shoulders bilaterally.    DIAGNOSTIC STUDIES:   Imaging studies and pathology results were personally reviewed, as detailed above in the HPI.    Mammo Breast Specimen    Result Date: 10/14/2019   1. Intraoperative specimen radiograph shows a clip and mag seed marker. Blair Promise, MD  10/14/2019 9:52 AM    Mammo Post Clip Placement Left Stereo    Result Date: 10/13/2019   Ultrasound guided MAG seed marker placement in the left breast at the 2:00 position at the site of the biopsy-proven malignancy denoted by a hook shaped marker. Postbiopsy mammogram: Craniocaudal and mediolateral views of the left breast following the procedure demonstrate the MAG seed marker appropriately positioned at the site of the 2:00 position hook shaped marker. Clip summary: Sono marker placement 10/13/2019 MAG seed at hook shaped marker Left breast 2:00 Path is invasive ductal carcinoma.  Electronically signed by: Quinn Plowman M.D.  [Interpreted at: 41BCF] [facility] BEG: 10/13/19    NM Inject Only Sentinal Node Identification    Result Date: 10/14/2019   Tc 63m labeled sulfur colloid injections within the left breast at locations indicated above for sentinel node localization. Collene Schlichter, MD  10/14/2019 11:27 AM    US Guided Needle Placement Only Left    Result Date: 10/13/2019   Ultrasound guided MAG seed marker placement in the left breast at the 2:00  position at the site of the biopsy-proven malignancy denoted by a hook shaped marker. Postbiopsy mammogram: Craniocaudal and mediolateral views of the left breast following the procedure demonstrate the MAG seed marker appropriately positioned at the site of the 2:00 position hook shaped marker. Clip summary: Sono marker placement 10/13/2019 MAG seed at hook shaped marker Left breast 2:00 Path is invasive ductal carcinoma.  Electronically signed by: Quinn Plowman M.D.  [Interpreted at: 41BCF] [facility] BEG: 10/13/19    US Breast Left Ltd    Result Date: 10/13/2019   Subtle mass at the 2:00 axis with associated biopsy marker, consistent with known malignancy. Magseed placement to follow which will be dictated separately. BIRADS CATEGORY 06-KNOWN BIOPSY PROVEN MALIGNANCY Electronically signed by: Elon Alas M.D.  [Interpreted at: 41BCF] [facility] [place]: 10/13/19        LABORATORY STUDIES:   N/A    Thank you for allowing med to participate in the management and care of this patient.   If I may answer any questions, please do not hesitate to contact me at any  time.      Darrol Poke. Sibyl Parr, MD, MPharmacol.  Attending Radiation Oncologist   [facility]  Department of Advanced Radiation Oncology and Proton Therapy  [address]  [place], [place] [address]  T [phone]  Carmon Ginsberg [phone]  LinkedIn   [facility]     My total visit time for this patient encounter was 100 min. This includes time spent preparing to see the patient, performing the exam, counselling patient/family, ordering and reviewing tests, medications and/or procedures, care coordination, and documenting clinical information in the record.

## 2019-11-10 ENCOUNTER — Encounter: Payer: Self-pay | Admitting: Radiation Oncology

## 2019-11-14 ENCOUNTER — Inpatient Hospital Stay: Payer: Commercial Managed Care - POS | Admitting: Rehabilitative and Restorative Service Providers"

## 2019-11-14 ENCOUNTER — Inpatient Hospital Stay
Payer: Commercial Managed Care - POS | Attending: Radiation Oncology | Admitting: Rehabilitative and Restorative Service Providers"

## 2019-11-14 VITALS — BP 149/102 | HR 82

## 2019-11-14 DIAGNOSIS — M25612 Stiffness of left shoulder, not elsewhere classified: Secondary | ICD-10-CM | POA: Insufficient documentation

## 2019-11-14 DIAGNOSIS — M25512 Pain in left shoulder: Secondary | ICD-10-CM | POA: Insufficient documentation

## 2019-11-14 DIAGNOSIS — M25511 Pain in right shoulder: Secondary | ICD-10-CM | POA: Insufficient documentation
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
## 2019-11-15 ENCOUNTER — Inpatient Hospital Stay: Payer: Commercial Managed Care - POS | Admitting: Rehabilitative and Restorative Service Providers"

## 2019-11-15 ENCOUNTER — Encounter: Payer: Self-pay | Admitting: Rehabilitative and Restorative Service Providers"

## 2019-11-15 DIAGNOSIS — M25612 Stiffness of left shoulder, not elsewhere classified: Secondary | ICD-10-CM | POA: Insufficient documentation

## 2019-11-15 DIAGNOSIS — M25512 Pain in left shoulder: Secondary | ICD-10-CM | POA: Insufficient documentation

## 2019-11-15 DIAGNOSIS — M25511 Pain in right shoulder: Secondary | ICD-10-CM | POA: Insufficient documentation

## 2019-11-16 ENCOUNTER — Inpatient Hospital Stay
Payer: Commercial Managed Care - POS | Attending: Radiation Oncology | Admitting: Rehabilitative and Restorative Service Providers"

## 2019-11-16 DIAGNOSIS — M25612 Stiffness of left shoulder, not elsewhere classified: Secondary | ICD-10-CM | POA: Insufficient documentation

## 2019-11-16 DIAGNOSIS — M25511 Pain in right shoulder: Secondary | ICD-10-CM | POA: Insufficient documentation

## 2019-11-16 DIAGNOSIS — M25512 Pain in left shoulder: Secondary | ICD-10-CM

## 2019-11-16 NOTE — PT/OT Therapy Note (Signed)
Name: Leslie [name] Age: 49 y.o.   Date of Service: 11/16/2019  Referring Physician: Valeria Batman, MD   Date of Injury: 10/14/2019  Date Care Plan Established/Reviewed: 11/14/2019  Date Treatment Started: 11/14/2019  End of Certification Date: 02/11/2020  Sessions in Plan of Care: 18  Surgery Date: 10/14/2019    Visit Count: 2   Diagnosis:   1. Acute pain of left shoulder    2. Stiffness of left shoulder joint    3. Acute pain of right shoulder        Subjective     Pain   Haven't been able to inspect breast incisions in mirror yet.Fearful of moving arms too far overhead to pull on incisions.Able to sleep on back but hesitant to sleep on side with current discomfort.Living with mother in 4th floor apartment and find navigating stairs difficult with general weakness and SOB.Mother assists with bathing,driving,grocery shopping,meal preparation.Able to get dressed,reach for light items,and brush teeth,wash face independently.    Social Support/Occupation  Lives in: condominium  Lives with: parents  Occupation: Not Working      Precautions: hypertension  Allergies: Gabapentin, Valsartan, and Penicillins                Treatment     Therapeutic Exercises - Justified to address any of the following: develop strength, endurance, ROM and/or flexibility.   Supine:  PROM B shoulders to tolerance.  Brief review of HEP with patient demonstration  -B gentle pectoral stretch with LTR x 10,10 sec hold each  -C/S rotation to R, x 5,10 sec hold.      Manual Therapy - Justified to address any of the following:  Mobilization of joints and soft tissues, manipulation, manual lymphatic drainage, and/or manual traction.    Supine:  STM left lateral JP drain sites,lateral pectoral border    Therapeutic Activity - Justified to address the following: activities to improve functional performance.  Educated to purchase standing dressing mirror to inspect skin in front and back daily.Use of chair in shower for safe bathing with current  neuropathy.Trial of independent bathing and meal preparation to improve AROM in both shoulders.Inspected B surgical incisions and tolerance of stretching for daily compliance.    Home Exercises   Access Code: [email or web address]  URL: [email or web address]  Date: 11/14/2019  Prepared by: Welford Roche       ---      ---   Total Time   Timed Minutes  45 minutes   Total Time  45 minutes        Assessment   Pertinent medical history:Neoadjuvant ddAC chemo complete with Dr Ladell Heads 2 months ago with residual neuropathy.Unable to tolerate CarboTaxol infusion and stopped infusion on 06/23/19.Left partial MRM with SLNB x 2 and right breast reduction a month ago.Advised to walk with upright posture and correct shoe wear to improve safe walking without AD.Educated importance of compliant HEP to improve shoulder mobility to tolerate radiation treatment position.  Plan   Dr Sibyl Parr recommended CT radiation simulation on 11/21/19 to plan 25-30 treatments to left side.Emphasis on compliant HEP and PT MT to improve left shoulder mobility to tolerate radiation treatment position.Add side lying left shoulder ABD,HABD to tolerance.Baseline B upper extremity circumference on 11/21/19 treatment.      Goals    Goal 1:  Improve Foto Score from 54 to match or exceed predicted FOTO score of 73,  to demonstrate improvement of functional ability with physical therapeutic intervention.     Sessions: 81  Goal 2: Patient will demonstrate shoulder flexion AROM of greater than or equal to 140 degrees to allow patient to lift objects into overhead cabinets without pain and without compensation     Sessions: 18      Goal 3: Patient will demonstrate shoulder abduction AROM with greater than or equal to 145 degrees to allow patient to wash hair and put on and take off shirt without pain and without compensation'   Sessions: 18      Goal 4: Patient will demonstrate independence in prescribed HEP with proper form, sets and reps for safe discharge  to an independent program.     Sessions: 18                                Barnet Glasgow , PT,CLT

## 2019-11-18 ENCOUNTER — Inpatient Hospital Stay
Payer: Commercial Managed Care - POS | Attending: Radiation Oncology | Admitting: Rehabilitative and Restorative Service Providers"

## 2019-11-18 DIAGNOSIS — M25512 Pain in left shoulder: Secondary | ICD-10-CM | POA: Insufficient documentation

## 2019-11-18 DIAGNOSIS — M25511 Pain in right shoulder: Secondary | ICD-10-CM | POA: Insufficient documentation

## 2019-11-18 DIAGNOSIS — M25612 Stiffness of left shoulder, not elsewhere classified: Secondary | ICD-10-CM | POA: Insufficient documentation

## 2019-11-18 NOTE — PT/OT Therapy Note (Signed)
Name: Leslie [name] Age: 49 y.o.   Date of Service: 11/18/2019  Referring Physician: Valeria Batman, MD   Date of Injury: 10/14/2019  Date Care Plan Established/Reviewed: 11/14/2019  Date Treatment Started: 11/14/2019  End of Certification Date: 02/11/2020  Sessions in Plan of Care: 18  Surgery Date: 10/14/2019    Visit Count: 3   Diagnosis:   1. Acute pain of left shoulder    2. Stiffness of left shoulder joint    3. Acute pain of right shoulder        Subjective     Social Support/Occupation  Lives in: condominium  Lives with: parents  Occupation: Not Working    Pt reports no increase in pain or symptoms since last visit, she is able to relax the arm next to her better with therapy compared to before therapy.       Precautions: hypertension  Allergies: Gabapentin, Valsartan, and Penicillins                      Treatment     Therapeutic Exercises - Justified to address any of the following: develop strength, endurance, ROM and/or flexibility.   - Standing: pulleys for shoulder flexion stretch 5 sec hold 15 x for L only   - seated shoulder and table slides 5 sec hold 10 x   - trial of shoulder Abd and ER position for L shoulder in supine and arm rested on pillows, x 10 reps   - supine shoulder ER stretch dowel 5 sec hold 10 x    - instructed in use of dowel for shoulder flexion press up 10 x        Deferred today:   Supine:  PROM B shoulders to tolerance.  Brief review of HEP with patient demonstration  -B gentle pectoral stretch with LTR x 10,10 sec hold each  -C/S rotation to R, x 5,10 sec hold.      Manual Therapy - Justified to address any of the following:  Mobilization of joints and soft tissues, manipulation, manual lymphatic drainage, and/or manual traction.    Supine:  STM left lateral JP drain sites,lateral pectoral border    Home Exercises   Access Code: [email or web address]  URL: [email or web address]  Date: 11/18/2019  Prepared by: Kem Parkinson        ---      ---   Total Time   Timed Minutes  39 minutes    Total Time  39 minutes        Assessment   Demonstrates improved shoulder ROM with use of mpulleys, progressed HEP to include shoulder stretches including abd and ER combination in preparation for radiation positioning.   Plan   Dr Sibyl Parr recommended CT radiation simulation on 11/21/19 to plan 25-30 treatments to left side.Emphasis on compliant HEP and PT MT to improve left shoulder mobility to tolerate radiation treatment position.Add side lying left shoulder ABD,HABD to tolerance.Baseline B upper extremity circumference on 11/21/19 treatment.  *plans to stop PT, per MD request, once radiation treatment starts.       Range of Motion: (degrees)  InitialRight  AROM InitialRight  PROM   Right  AROM   Right PROM Shoulder Initial Left AROM InitialLeft PROM   Left AROM  11/18/19   Left PROM   110 strain on top    Flexion 56 p!  114 on pulleys    62    Extension 54 straining      92  Abduction 56 p! On top  104 with table slides    FR To T8    Internal Rotation FR to T10      72    External Rotation 62 strain on anterior      (blank fields were intentionally left blank)      Goals    Goal 1:  Improve Foto Score from 54 to match or exceed predicted FOTO score of 73,  to demonstrate improvement of functional ability with physical therapeutic intervention.     Sessions: 18      Goal 2: Patient will demonstrate shoulder flexion AROM of greater than or equal to 140 degrees to allow patient to lift objects into overhead cabinets without pain and without compensation     Sessions: 18      Goal 3: Patient will demonstrate shoulder abduction AROM with greater than or equal to 145 degrees to allow patient to wash hair and put on and take off shirt without pain and without compensation'   Sessions: 18      Goal 4: Patient will demonstrate independence in prescribed HEP with proper form, sets and reps for safe discharge to an independent program.     Sessions: 15                                 Marrisa Kimber A Jabier Mutton, DPT

## 2019-11-21 ENCOUNTER — Inpatient Hospital Stay
Payer: Commercial Managed Care - POS | Attending: Radiation Oncology | Admitting: Rehabilitative and Restorative Service Providers"

## 2019-11-21 ENCOUNTER — Inpatient Hospital Stay: Payer: Commercial Managed Care - POS | Admitting: Rehabilitative and Restorative Service Providers"

## 2019-11-21 DIAGNOSIS — M25511 Pain in right shoulder: Secondary | ICD-10-CM | POA: Insufficient documentation

## 2019-11-21 DIAGNOSIS — M25512 Pain in left shoulder: Secondary | ICD-10-CM | POA: Insufficient documentation

## 2019-11-21 DIAGNOSIS — M25612 Stiffness of left shoulder, not elsewhere classified: Secondary | ICD-10-CM | POA: Insufficient documentation

## 2019-11-22 NOTE — PT/OT Therapy Note (Signed)
Name: Leslie [name] Age: 49 y.o.   Date of Service: 11/21/2019  Referring Physician: Valeria Batman, MD   Date of Injury: 10/14/2019  Date Care Plan Established/Reviewed: 11/14/2019  Date Treatment Started: 11/14/2019  End of Certification Date: 02/11/2020  Sessions in Plan of Care: 18  Surgery Date: 10/14/2019    Visit Count: 4   Diagnosis:   1. Acute pain of left shoulder    2. Stiffness of left shoulder joint    3. Acute pain of right shoulder      Subjective     Pain   Planned CT radiation mapping for left side on 11/28/19.Will discuss side effect concerns with Dr Sibyl Parr with current 25-30 treatment plan.Purchased back scrubber to bathe independently and dressing mirror to inspect skin daily.Using Gold Bond shea butter lotion for skin care.Notice improved mobility in left shoulder while reaching for objects overhead.Compliant with home stretches once a day.Notice discomfort in left mid back while stretching overhead.    Social Support/Occupation  Lives in: condominium  Lives with: parents  Occupation: Not Working    Precautions: hypertension  Allergies: Gabapentin, Valsartan, and Penicillins    Lymphedema Measurements (UE)    R  (dominant) Baseline 11/21/19  Supine 1 pm  BW 278 lbs L*   6.4 cm Thumb IP 6.0 cm   22.3 Mid Palm 22.4   20.0 Ulnar Styloid 19.1   22.0 UE 4 cm. Prox. 22.3   24.9 UE 8 cm. Prox. 25.0   29.2 UE 12 cm. Prox. 28.5   32.1 UE 16 cm. Prox. 31.2   33.4 UE 20 cm. Prox. 32.5   33.1 UE 24 cm. Prox. 32.2   37.5 UE 28 cm. Prox. 34.5   39.9 UE 32 cm. Prox. 37.0   41.5 UE 36 cm. Prox. 39.7   44.1 UE 40 cm. Prox. 41.3   47.1 UE 44 cm. Prox. 43.5   50.6 UE 48 cm. Prox. 45.7                 Treatment     Therapeutic Exercises - Justified to address any of the following: develop strength, endurance, ROM and/or flexibility.   - Standing: pulleys for shoulder flexion,scaption stretch 5 sec hold 15 x for L only (purchased pulley in clinic today)  - trial of shoulder Abd and ER position for L shoulder in  supine and arm rested on pillows, x 10 reps   - supine shoulder ER stretch dowel 5 sec hold 10 x    - instructed in use of dowel for shoulder flexion press up 10 x    Supine:  PROM B shoulders to tolerance.  -Soft T/S 1/2 roll gentle pectoral stretch,3 x 30 sec  Right Side Lying:  -Left shoulder HABD for gentle pectoral stretch,x 5,10 sec hold  -Left shoulder ABD to tolerance with PT stabilizing scapula,x 5,10 sec hold  -Left shoulder HBH gentle ER stretch x 5,10 sec hold  -Left scapular depression and retraction x 10,5 sec hold    Baseline circumference measurements taken            Manual Therapy - Justified to address any of the following:  Mobilization of joints and soft tissues, manipulation, manual lymphatic drainage, and/or manual traction.    Supine:  STM left lateral JP drain sites,lateral pectoral border,proximal triceps and latissimus    Therapeutic Activity - Justified to address the following: activities to improve functional performance.  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    Home Exercises   Access Code: [email or web address]  URL: [email or web address]  Date: 11/18/2019  Prepared by: Kem Parkinson        ---      ---   Total Time   Timed Minutes  45 minutes   Total Time  45 minutes        Assessment   Pertinent medical history:Neoadjuvant ddAC chemo complete with Dr Ladell Heads 2 months ago with residual neuropathy.Unable to tolerate CarboTaxol infusion  and stopped infusion on 06/23/19.Left partial MRM with SLNB x 2 and right breast reduction a month ago.  Advised to walk with upright posture and correct shoe wear to improve safe walking without AD.Educated importance of compliant HEP to improve shoulder mobility to tolerate radiation treatment position.Tender left middle trapezius and subscapularis with MT today.Baseline circumference measurements unremarkable.  Plan   To discuss with Dr Sibyl Parr continued PT, if able, during radiation treatments to maintain shoulder mobility.      Goals    Goal 1:  Improve Foto Score from 54 to match or exceed predicted FOTO score of 73,  to demonstrate improvement of functional ability with physical therapeutic intervention.     Sessions: 18      Goal 2: Patient will demonstrate shoulder flexion AROM of greater than or equal to 140 degrees to allow patient to lift objects into overhead cabinets without pain and without compensation     Sessions: 18      Goal 3: Patient will demonstrate shoulder abduction AROM with greater than or equal to 145 degrees to allow patient to wash hair and put on and take off shirt without pain and without compensation'   Sessions: 18      Goal 4: Patient will demonstrate independence in prescribed HEP with proper form, sets and reps for safe discharge to an independent program.  11/21/19:Compliant once a day.KW   Sessions: [address]                                Barnet Glasgow , PT,CLT

## 2019-11-23 ENCOUNTER — Inpatient Hospital Stay
Payer: Commercial Managed Care - POS | Attending: Radiation Oncology | Admitting: Rehabilitative and Restorative Service Providers"

## 2019-11-23 DIAGNOSIS — M25512 Pain in left shoulder: Secondary | ICD-10-CM | POA: Insufficient documentation

## 2019-11-23 DIAGNOSIS — M25612 Stiffness of left shoulder, not elsewhere classified: Secondary | ICD-10-CM | POA: Insufficient documentation

## 2019-11-23 DIAGNOSIS — M25511 Pain in right shoulder: Secondary | ICD-10-CM | POA: Insufficient documentation

## 2019-11-23 NOTE — PT/OT Therapy Note (Signed)
Name: Leslie [name] Age: 49 y.o.   Date of Service: 11/23/2019  Referring Physician: Valeria Batman, MD   Date of Injury: 10/14/2019  Date Care Plan Established/Reviewed: 11/14/2019  Date Treatment Started: 11/14/2019  End of Certification Date: 02/11/2020  Sessions in Plan of Care: 18  Surgery Date: 10/14/2019    Visit Count: 5   Diagnosis:   1. Acute pain of left shoulder    2. Stiffness of left shoulder joint    3. Acute pain of right shoulder      Subjective     Social Support/Occupation  Lives in: condominium  Lives with: parents  Occupation: Not Working    Precautions: hypertension  Allergies: Gabapentin, Valsartan, and Penicillins    Lymphedema Measurements (UE)    R  (dominant) Baseline 11/21/19  Supine 1 pm  BW 278 lbs L*   6.4 cm Thumb IP 6.0 cm   22.3 Mid Palm 22.4   20.0 Ulnar Styloid 19.1   22.0 UE 4 cm. Prox. 22.3   24.9 UE 8 cm. Prox. 25.0   29.2 UE 12 cm. Prox. 28.5   32.1 UE 16 cm. Prox. 31.2   33.4 UE 20 cm. Prox. 32.5   33.1 UE 24 cm. Prox. 32.2   37.5 UE 28 cm. Prox. 34.5   39.9 UE 32 cm. Prox. 37.0   41.5 UE 36 cm. Prox. 39.7   44.1 UE 40 cm. Prox. 41.3   47.1 UE 44 cm. Prox. 43.5   50.6 UE 48 cm. Prox. 45.7                       Treatment     Therapeutic Exercises - Justified to address any of the following: develop strength, endurance, ROM and/or flexibility.   PROM B shoulders to tolerance.    SL:  - Scapular retraction and depression with cuing for proper movement 10x with 5 sec hold for gentle pec stretch     - Page openers on L only 8x without hold time- reduced UE movement for decreased pain    Supine:  -Left shoulder HBH gentle ER stretch x10 for mobility then 5x with 5 sec hold - cuing for gentle scapular mobilization to improve ROM    Seated:  - shoulder ER stretch dowel 10 sec hold 6 x  - cuing for gentle scapular retraction with movement   - Shoulder flexion ball roll in sitting 10x    - Standing:   pulleys for shoulder flexion,scaption stretch 5 sec hold 15 x for L only     Gentle  pec stretch at ~45 degrees abd at doorframe (straight arm) with cuing for gentle rotation away from door 4x 15 sec hold on L only                   Manual Therapy - Justified to address any of the following:  Mobilization of joints and soft tissues, manipulation, manual lymphatic drainage, and/or manual traction.    Supine:  STM lateral pectoral border, Gh joint line, deltoid, ERs, and lats    SL   Gentle scapular mobilization with and without PROM of L shoulder  - Manual lat stretch with scapular depression       Home Exercises   Access Code: [email or web address]  URL: [email or web address]  Date: 11/18/2019  Prepared by: Kem Parkinson        ---      ---   Total Time   Timed Minutes  45 minutes   Total Time  45 minutes        Assessment   Pertinent medical history:Neoadjuvant ddAC chemo complete with Dr Ladell Heads 2 months ago with residual neuropathy.Unable to tolerate CarboTaxol infusion and stopped infusion on 06/23/19.Left partial MRM with SLNB x 2 and right breast reduction a month ago.  Continued with gentle, tolerable ROM of GH and scapula as tolerated on L shoulder. Continued focus on overhead reaching motions and ER in preparation of radiation treatment starting on 10/18. Discussed continuation of movement of UE as tolerated to reduce stiffness and encourage functional use of L UE.   Plan   To discuss with Dr Sibyl Parr continued PT, if able, during radiation treatments to maintain shoulder mobility.      Goals    Goal 1:  Improve Foto Score from 54 to match or exceed predicted FOTO score of 73,  to demonstrate improvement of functional ability with physical therapeutic intervention.     Sessions: 18      Goal 2: Patient will demonstrate shoulder flexion AROM of greater than or equal to 140 degrees to allow patient to lift objects into overhead cabinets without pain and without compensation     Sessions: 18      Goal 3: Patient will demonstrate shoulder abduction AROM with greater than or equal to 145 degrees to  allow patient to wash hair and put on and take off shirt without pain and without compensation'   Sessions: 18      Goal 4: Patient will demonstrate independence in prescribed HEP with proper form, sets and reps for safe discharge to an independent program.  11/21/19:Compliant once a day.KW   Sessions: [address]                                Lyndal Rainbow, DPT,CLT

## 2019-11-24 ENCOUNTER — Inpatient Hospital Stay
Payer: Commercial Managed Care - POS | Attending: Radiation Oncology | Admitting: Rehabilitative and Restorative Service Providers"

## 2019-11-24 ENCOUNTER — Ambulatory Visit
Admission: RE | Admit: 2019-11-24 | Discharge: 2019-11-24 | Disposition: A | Discharge status: Home / Self Care | Payer: Commercial Managed Care - POS | Source: Ambulatory Visit | Attending: Surgery | Admitting: Surgery

## 2019-11-24 DIAGNOSIS — M25511 Pain in right shoulder: Secondary | ICD-10-CM | POA: Insufficient documentation

## 2019-11-24 DIAGNOSIS — Z171 Estrogen receptor negative status [ER-]: Secondary | ICD-10-CM | POA: Insufficient documentation

## 2019-11-24 DIAGNOSIS — M25612 Stiffness of left shoulder, not elsewhere classified: Secondary | ICD-10-CM | POA: Insufficient documentation

## 2019-11-24 DIAGNOSIS — C50412 Malignant neoplasm of upper-outer quadrant of left female breast: Secondary | ICD-10-CM | POA: Insufficient documentation

## 2019-11-24 DIAGNOSIS — M25512 Pain in left shoulder: Secondary | ICD-10-CM | POA: Insufficient documentation

## 2019-11-24 NOTE — PT/OT Therapy Note (Addendum)
Discontinuation of Therapy Services    Leslie [name] did not complete prescribed physical therapy visits.      Status is unknown at this time, physical therapy has been discontinued and patient has been discharged from care.  The last therapy note is below for review.    Please feel free to contact me with any questions regarding the care of Leslie [name].    Sincerely,    Mauri Brooklyn, PT, DPT        01/03/2020              Name: Leslie [name] Age: 49 y.o.   Date of Service: 11/24/2019  Referring Physician: Valeria Batman, MD   Date of Injury: 10/14/2019  Date Care Plan Established/Reviewed: 11/14/2019  Date Treatment Started: 11/14/2019  End of Certification Date: 02/11/2020  [name] in Plan of Care: 18  Surgery Date: 10/14/2019    Visit Count: 6   Diagnosis:   1. Acute pain of left shoulder    2. Stiffness of left shoulder joint    3. Acute pain of right shoulder      Subjective     Social Support/Occupation  Lives in: condominium  Lives with: parents  Occupation: Not Working    Patient reporting some soreness since last session yesterday. Would like to see if she can get in Monday/Wednesday the week she has treatment but does not know time of treatment yet (will get it at appointment she has Monday for mapping)- will likely come up after appointment to see if we have availability.    Precautions: hypertension  Allergies: Gabapentin, Valsartan, and Penicillins    Lymphedema Measurements (UE)    R  (dominant) Baseline 11/21/19  Supine 1 pm  BW 278 lbs L*   6.4 cm Thumb IP 6.0 cm   22.3 Mid Palm 22.4   20.0 Ulnar Styloid 19.1   22.0 UE 4 cm. Prox. 22.3   24.9 UE 8 cm. Prox. 25.0   29.2 UE 12 cm. Prox. 28.5   32.1 UE 16 cm. Prox. 31.2   33.4 UE 20 cm. Prox. 32.5   33.1 UE 24 cm. Prox. 32.2   37.5 UE 28 cm. Prox. 34.5   39.9 UE 32 cm. Prox. 37.0   41.5 UE 36 cm. Prox. 39.7   44.1 UE 40 cm. Prox. 41.3   47.1 UE 44 cm. Prox. 43.5   50.6 UE 48 cm. Prox. 45.7                       Treatment     Therapeutic Exercises -  Justified to address any of the following: develop strength, endurance, ROM and/or flexibility.   Manual stretching to B shoulders to tolerance.    SL:  - Page openers on L only x 5 each without hold time    Supine:  -Left shoulder HBH gentle ER stretch x10 for mobility then 5x with 5 sec hold - cuing for gentle scapular mobilization to improve ROM    Seated:  - Shoulder flexion ball roll in sitting 10x  - Scapular retraction and depression with cuing for proper movement x 10 with 5 sec hold for gentle pec stretch   - Posterior Shoulder Roll, ~7 reps    - Standing:   Corner Pec Stretch with arm at ~45 degrees abd at doorframe (straight arm) with cuing for gentle rotation away from door x 15 sec hold x 5 on L only  Deferred today:  pulleys for shoulder flexion,scaption stretch 5 sec hold 15 x for L only    - shoulder ER stretch dowel 10 sec hold 6 x  - cuing for gentle scapular retraction with movement             Manual Therapy - Justified to address any of the following:  Mobilization of joints and soft tissues, manipulation, manual lymphatic drainage, and/or manual traction.    Supine:  STM lateral pectoral border, Gh joint line, deltoid, ERs, and lats    SL   Gentle scapular mobilization with and without PROM of L shoulder  - Manual lat stretch with scapular depression       Home Exercises   Access Code: [email or web address]  URL: [email or web address]  Date: 11/18/2019  Prepared by: Kem Parkinson        ---      ---   Total Time   Timed Minutes  43 minutes   Total Time  43 minutes        Assessment   Pertinent medical history:Neoadjuvant ddAC chemo complete with Dr Ladell Heads 2 months ago with residual neuropathy.Unable to tolerate CarboTaxol infusion and stopped infusion on 06/23/19.Left partial MRM with SLNB x 2 and right breast reduction a month ago.  Patient demonstrating increased L shoulder/scapular tightness compared to R. Therapist continuing to perform gentle manual stretching per patient tolerance and  encouraging patient to use arm functionally at home.  Plan   To discuss with Dr Sibyl Parr continued PT, if able, during radiation treatments to maintain shoulder mobility.      Goals    Goal 1:  Improve Foto Score from 54 to match or exceed predicted FOTO score of 73,  to demonstrate improvement of functional ability with physical therapeutic intervention.     [name]: 18      Goal 2: Patient will demonstrate shoulder flexion AROM of greater than or equal to 140 degrees to allow patient to lift objects into overhead cabinets without pain and without compensation     [name]: 18      Goal 3: Patient will demonstrate shoulder abduction AROM with greater than or equal to 145 degrees to allow patient to wash hair and put on and take off shirt without pain and without compensation'   [name]: 18      Goal 4: Patient will demonstrate independence in prescribed HEP with proper form, sets and reps for safe discharge to an independent program.  11/21/19:Compliant once a day.KW   [name]: [address]                                Leslie [name], DPT,CLT

## 2019-11-28 ENCOUNTER — Encounter: Payer: Self-pay | Admitting: Radiation Oncology

## 2019-11-28 ENCOUNTER — Ambulatory Visit: Payer: Self-pay | Admitting: Radiation Oncology

## 2019-11-28 DIAGNOSIS — C50412 Malignant neoplasm of upper-outer quadrant of left female breast: Secondary | ICD-10-CM

## 2019-11-28 DIAGNOSIS — Z171 Estrogen receptor negative status [ER-]: Secondary | ICD-10-CM

## 2019-11-28 NOTE — Progress Notes (Signed)
11/28/19 0954   Comfort Alteration   Karnofsky Performance Score 100%-normal, no complaints   Fatigue 2   Pain Score 0   Nutrition Alteration   Anorexia 0-none   Skin Alteration   Radiation Dermatitis 0-none   Folliculitis No   Emotional Alteration   Coping 0-effective   Vital signs   Temp 98.4 F (36.9 C)   Temp Source Oral   Heart Rate 64   Resp Rate 16   BP 117/77

## 2019-11-28 NOTE — Progress Notes (Signed)
RADIATION ONCOLOGY CLINIC NOTE    Requesting Physician: Fuller Mandril, MD    Consulting Physicians:  1) [name] MD    PCP: Satira Mccallum, MD    IDENTIFYING HISTORY: Leslie [name] is a 49 y.o.  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  She is status post neoadjuvant chemotherapy followed by partial mastectomy/sentinel lymph node biopsy (pCR).  She presents today for further discussions regarding adjuvant radiotherapy for her cancer.    Breast: Left      quadrant: Lateral                     Clockface: 3:00                       distance from nipple: Not reported  TNM Stage (AJCC 8th Edition): cT2N0M0 (IIB)  ER: 0%            PR: 0%            Her2/neu: 1+, negative  Grade: 3          LVI: Unable to assess, PCR               Ki67: 80%  Oncotype Dx: N/A    ONCOLOGIC HISTORY:  1.  The patient initially presented for her first mammogram.  Per the patient report, this demonstrated a mass in the left breast.  An ultrasound demonstrated a 3.1 x 2 cm hypoechoic mass, and a mildly enlarged left axillary lymph node.  2.  On March 07, 2019, the patient underwent an ultrasound-guided core needle biopsy of the left breast mass.  This revealed an invasive ductal carcinoma, grade 3, triple negative, Ki-67 80%.  In addition a core needle biopsy of the left axillary lymph node was negative.  3.  An MRI of the breast was performed on March 25, 2019.  The mass measured 4 x 1.9 x 1.9 cm without other significant findings.  4.  The patient was seen in consultation by Dr. Corie Chiquito and [name], who recommended neoadjuvant chemotherapy.  5.  The patient subsequently completed neoadjuvant chemotherapy under the care of Dr. Ladell Heads.  This was tolerated with significant toxicities, including severe neuropathy and fatigue.  6.  On September 12, 2019, the patient underwent a restaging breast MRI, showing resolution of  disease.  7.  On October 14, 2019, the patient was taken to the operating room by Dr. Henderson Baltimore and Devona Konig for a left partial mastectomy and sentinel lymph node biopsy with oncoplastic reconstruction and a right reduction mammoplasty.  Pathology showed a pathological complete response, with a scarred tumor bed in the breast.  2 sentinel lymph nodes were negative, without evidence of treatment effect.  8.  I initially met the patient in consultation on November 09, 2019.  Given the initial bulk of disease prior to chemotherapy, and her triple negative phenotype, I recommended a course of adjuvant radiotherapy to include the left breast, and regional lymphatics.  After an extensive discussion, the patient elected to move forward.  I recommended physical therapy before representing for CT simulation.    INTERVAL HISTORY:   9.  In the interim, the patient has engaged with physical therapy, with significant improvements in the range of motion of her bilateral shoulders.  She is ready to move forward with radiation.    Leslie [name] returns today to again discuss  definitive management of their cancer. Since our last visit, they have continued to do well, with no intercurrent health issues.    PERFORMANCE STATUS:  ECOG: 0  KPS: 100%     11/28/19 0954   Comfort Alteration   Karnofsky Performance Score 100%-normal, no complaints   Fatigue 2   Pain Score 0   Nutrition Alteration   Anorexia 0-none   Skin Alteration   Radiation Dermatitis 0-none   Folliculitis No   Emotional Alteration   Coping 0-effective   Vital signs   Temp 98.4 F (36.9 C)   Temp Source Oral   Heart Rate 64   Resp Rate 16   BP 117/77       Current Outpatient Medications:     apixaban (ELIQUIS) 5 MG, Take 1 tablet (5 mg total) by mouth every 12 (twelve) hours, Disp: 60 tablet, Rfl: 3    clindamycin (CLEOCIN) 300 MG capsule, , Disp: , Rfl:     clotrimazole (MYCELEX) 10 MG troche, Take 1 tablet (10 mg total) by mouth 3 (three) times daily,  Disp: 21 tablet, Rfl: 0    dexamethasone (DECADRON) 4 MG tablet, Take 8 mg (2 tabs) on days 2, 3, and 4 for breakfast and days 3 and 4 for lunch of each chemotherapy cycle., Disp: 40 tablet, Rfl: 0    dexlansoprazole 60 MG capsule, Take 1 capsule (60 mg total) by mouth daily, Disp: 60 capsule, Rfl: 1    DULoxetine (CYMBALTA) 30 MG capsule, Take 1 capsule (30 mg total) by mouth daily, Disp: 30 capsule, Rfl: 0    folic acid (FOLVITE) 1 MG tablet, Take 1 mg by mouth daily, Disp: , Rfl:     gabapentin (NEURONTIN) 100 MG capsule, Take 3 capsules (300 mg total) by mouth 3 (three) times daily, Disp: 90 capsule, Rfl: 1    ibuprofen (ADVIL) 200 MG tablet, Take 400 mg by mouth daily, Disp: , Rfl:     Iron-Vitamin C (VITRON-C PO), Take by mouth daily, Disp: , Rfl:     lidocaine-prilocaine (EMLA) cream, Apply topically as needed (one hour prior to chemotherapy appointment), Disp: 30 g, Rfl: 3    losartan-hydrochlorothiazide (HYZAAR) 100-25 MG per tablet, Take 1 tablet by mouth daily, Disp: 90 tablet, Rfl: 1    nystatin (MYCOSTATIN) 100000 UNIT/ML suspension, Take 5 mLs (500,000 Units total) by mouth 4 (four) times daily, Disp: 60 mL, Rfl: 2    OLANZapine (ZyPREXA) 5 MG tablet, Take one tab at bedtime days 1,2,3, and 4 of each chemotherapy cycle, Disp: 30 tablet, Rfl: 0    Omeprazole (PRILOSEC PO), Take by mouth every morning, Disp: , Rfl:     ondansetron (Zofran ODT) 8 MG disintegrating tablet, Take 1 tablet (8 mg total) by mouth every 8 (eight) hours as needed for Nausea, Disp: 60 tablet, Rfl: 3    oxyCODONE (ROXICODONE) 5 MG immediate release tablet, , Disp: , Rfl:     oxyCODONE-acetaminophen (PERCOCET) 5-325 MG per tablet, , Disp: , Rfl:     potassium chloride (MICRO-K) 10 MEQ CR capsule, Take 1 capsule (10 mEq total) by mouth 2 (two) times daily, Disp: 14 capsule, Rfl: 0    prochlorperazine (COMPAZINE) 10 MG tablet, Take 1 tablet (10 mg total) by mouth every 6 (six) hours as needed (nausea), Disp: 30  tablet, Rfl: 3    tiZANidine (Zanaflex) 4 MG tablet, One tab every 8 hrs as needed sparingly to relax muscles, Disp: 30 tablet, Rfl: 0    traMADol (ULTRAM) 50  MG tablet, Take 50 mg by mouth every 6 (six) hours as needed for Pain, Disp: , Rfl:     vitamin B-12 (CYANOCOBALAMIN) 1000 MCG tablet, Take 1,000 mcg by mouth daily, Disp: , Rfl:     Allergies   Allergen Reactions    Gabapentin Facial Swelling     Eye swelling    Valsartan Other (See Comments)     achiness    Penicillins Hives     Symptoms occurred as child  Onset date: 07-28-2004       PHYSICAL EXAMINATION:  Vitals: There were no vitals filed for this visit.  Weight Monitoring 10/04/2019 10/14/2019 10/21/2019   Height 175.3 cm 175.3 cm -   Height Method Stated Stated -   Weight 127.007 kg 124.104 kg 122.925 kg   Weight Method Stated Standing Scale -   BMI (calculated) 41.4 kg/m2 40.5 kg/m2 -       PAIN SCORE: 0/10    General appearance: Comfortable appearing, no acute distress.  Breast: Performed at the time of CT simulation with a female chaperone.  On the left, there is a well-healed Wise pattern scar, without any breakdown.  Drain sites noted laterally.  No evidence of infection.  Deep palpation not performed.  Right breast not examined.    The remainder of the exam was deferred for discussion of treatment and care coordination.     IMAGING STUDIES: The following films and/or reports have been personally reviewed: No new imaging to review.    ASSESSMENT:  Leslie [name] is a 49 y.o.  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  She is status post neoadjuvant chemotherapy followed by partial mastectomy/sentinel lymph node biopsy (pCR).  She presents today for further discussions regarding adjuvant radiotherapy for her cancer.    Please see my initial dictation for a full discussion of the indications, risks, benefits and logistics of treatment. As before, I  recommend adjuvant radiation to the left breast and regional lymphatics, potentially with a focused lumpectomy cavity boost depending on the ability to identify the cavity.  I again spent some time reviewing the potential side effects of treatment, which can include but not limited to She could expect some mild fatigue, skin redness and irritation, blistering, soft tissue swelling, chest wall pain, itching in the radiation field, hair loss in radiation field in the short term. In the long term, there is an increased risk of bone fracture in field, a small risk of radiation pneumonitis and pulmonary fibrosis, a small risk of heart disease, and a small risk of damage to nerves. There can be skin/soft tissue changes, including thickening, and scarring in rare cases. There is also a small risk of secondary malignancy. The patient was in agreement with this plan, and therefore consent was reviewed, signed and placed into the chart.    Other Issues: The patient has travel plans on January 14, 2020, and we will therefore endeavor to have her treatment start by Monday, December 05, 2019.    Pain action plan: No intervention necessary.    PLAN:  1.  CT simulation today.  2.  Tentative plan to begin adjuvant radiotherapy on Monday, December 05, 2019.  50 Gray in 25 fractions, with a potential 8 Gray/4 fraction boost.    My total visit time for this patient encounter was 35 min. This includes time spent preparing to see the patient, performing the exam, counselling patient/family, ordering  and reviewing tests, medications and/or procedures, care coordination, and documenting clinical information in the record.       Darrol Poke. Sibyl Parr, MD, MPharmacol.  Attending Radiation Oncologist   [facility]  Department of Advanced Radiation Oncology and Proton Therapy  [address]  [place], [place] [address]  T [phone]  F [phone]  LinkedIn   [facility]

## 2019-12-07 NOTE — Progress Notes (Signed)
PG-SGA 12/07/2019   As compared to my normal intake, I would rate my food intake during the past month as more than usual   Intake Total Score 0   Symptom keeping patient from eating enough last 2 weeks: no problems eating   Symptom keeping patient from eating enough last 2 weeks:    Symptom keeping patient from eating enough last 2 weeks:    Symptom keeping patient from eating enough last 2 weeks:    Symptom keeping patient from eating enough last 2 weeks:    Symptom keeping patient from eating enough last 2 weeks:    Symptom keeping patient from eating enough last 2 weeks:    Symptom keeping patient from eating enough last 2 weeks:    Over the past month, I would generally rate my activity as: not my normal self, but able to be up and about with fairly normal activities   I currently weigh about -- pounds 278   I am currently about -- inches tall 69   One month ago I weighed about -- pounds 285   Six months ago I weighed about -- pounds 300   During the past 2 weeks my weight has not changed   PG-SGA total score 1     Kieth Brightly, MS, RD, CSO  Museum/gallery exhibitions officer, Personal assistant Oncology  [phone]  [email or web address]

## 2019-12-08 ENCOUNTER — Ambulatory Visit: Payer: Self-pay | Admitting: Radiation Oncology

## 2019-12-08 DIAGNOSIS — C50412 Malignant neoplasm of upper-outer quadrant of left female breast: Secondary | ICD-10-CM

## 2019-12-08 DIAGNOSIS — Z171 Estrogen receptor negative status [ER-]: Secondary | ICD-10-CM

## 2019-12-08 NOTE — Progress Notes (Signed)
12/08/19 1000   Radiation Therapy Patient Care - Breast    Fraction  4   Daily Dose 200   Total Dose 800   Prescribed Dose 500   Planned Boost  No   Concurrent Therapy no   Site Left breast   Comfort Alteration   Karnofsky Performance Score 100%-normal, no complaints   Fatigue 3   Pain Score 0   Pain Loc BREAST   Pain Intervention 1-over-the-counter medications   Effectiveness of Pain Intervention 4-pain relieved 100%   Nutrition Alteration   Anorexia 0-none   Skin Alteration   Radiation Dermatitis 0-none   Folliculitis No   Emotional Alteration   Coping 0-effective     RADIATION ONCOLOGY ON TREATMENT NOTE     Current Dose and Fraction: Please see table above.  Prescribed dose is 5000 cGy.     Subjective:  Pain: 2/10    Week 1: 2/10 soreness to left breast.  Moderate fatigue.  Using coconut oil and statamed to breast daily.  No skin changes at this time.      Objective:  Vitals:  There were no vitals filed for this visit.    Weight Monitoring 09/14/2019 09/15/2019 09/26/2019 09/26/2019 10/04/2019 10/14/2019 10/21/2019   Height 175.3 cm - - 175.3 cm 175.3 cm 175.3 cm -   Height Method - - - - Stated Stated -   Weight 127.824 kg 128.368 kg 127.824 kg 127.461 kg 127.007 kg 124.104 kg 122.925 kg   Weight Method - - - - Stated Standing Scale -   BMI (calculated) 41.7 kg/m2 - - 41.6 kg/m2 41.4 kg/m2 40.5 kg/m2 -     General: Comfortable, alert, NAD. Ambulating independently.  Other: Breast examination performed with female chaperone.  No skin changes.     Laboratory data: None.    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 For RT.    SARS-CoV-2 Specimen Source (no units)   Date Value   06/08/2019 Nasopharyngeal     SARS CoV 2 Overall Result (no units)   Date Value   06/08/2019 Negative       Tolerating radiotherapy  with the expected acute side effects.  Setup and imaging reviewed and approved.     Plan:  Continue radiation therapy as planned.  Upon detailed review of her CT simulation, no lumpectomy cavity is appreciated, therefore a boost will not be performed.  Discussed this with the patient today, he was appreciative.  She will therefore receive 25 fractions as planned.  Pain plan: No change necessary.  Skin: Counseled on the importance of moisturizing and powder under the breast.     Darrol Poke. Sibyl Parr, MD, MPharmacol.  Attending Radiation Oncologist and Medical Director  [place] [facility]  [address]  [place], [place] [address]  T [phone] [facility])  T [phone] [facility])  F [phone]  LinkedIn   [facility]

## 2019-12-08 NOTE — Progress Notes (Signed)
[facility] Social Worker Case Manager   12/08/2019    Follow up Note:     SW met with patient today after her treatment. We discussed her current barriers of financial concerns, as she is not able to work due to daily treatments.     SW provided resources for financial assistance.As well as completing application for Leslie [name]. Leslie [name] assistance fund - if approved.     Patient is extremely thankful of this support and will contact the SW if any additional concerns arise.     Patient is currently staying with her mother is [address]  [place] [address]

## 2019-12-15 ENCOUNTER — Encounter (HOSPITAL_BASED_OUTPATIENT_CLINIC_OR_DEPARTMENT_OTHER): Payer: Self-pay

## 2019-12-15 ENCOUNTER — Ambulatory Visit: Payer: Self-pay | Admitting: Radiation Oncology

## 2019-12-15 DIAGNOSIS — Z171 Estrogen receptor negative status [ER-]: Secondary | ICD-10-CM

## 2019-12-15 DIAGNOSIS — C50412 Malignant neoplasm of upper-outer quadrant of left female breast: Secondary | ICD-10-CM

## 2019-12-15 NOTE — Progress Notes (Signed)
12/15/19 1200   Radiation Therapy Patient Care - Breast    Fraction  9   Daily Dose 200   Total Dose 1800   Prescribed Dose 5000   Planned Boost  No   Concurrent Therapy no   Site left breast   Comfort Alteration   Karnofsky Performance Score 100%-normal, no complaints   Fatigue 3   Pain Score 3   Pain Loc BREAST   Pain Intervention 1-over-the-counter medications   Effectiveness of Pain Intervention 4-pain relieved 100%   Nutrition Alteration   Anorexia 0-none   Skin Alteration   Radiation Dermatitis 1-faint erythema or dry desquamation   Folliculitis No   Emotional Alteration   Coping 0-effective

## 2019-12-15 NOTE — Progress Notes (Signed)
       12/15/19 1200   Radiation Therapy Patient Care - Breast    Fraction  9   Daily Dose 200   Total Dose 1800   Prescribed Dose 5000   Planned Boost  No   Concurrent Therapy no   Site left breast   Comfort Alteration   Karnofsky Performance Score 100%-normal, no complaints   Fatigue 3   Pain Score 3   Pain Loc BREAST   Pain Intervention 1-over-the-counter medications   Effectiveness of Pain Intervention 4-pain relieved 100%   Nutrition Alteration   Anorexia 0-none   Skin Alteration   Radiation Dermatitis 1-faint erythema or dry desquamation   Folliculitis No   Emotional Alteration   Coping 0-effective     RADIATION ONCOLOGY ON TREATMENT NOTE     Current Dose and Fraction: Please see table above.  Prescribed dose is 5000 cGy.     Subjective:  Pain: 2/10    Week 1: 2/10 soreness to left breast.  Moderate fatigue.  Using coconut oil and statamed to breast daily.  No skin changes at this time.   Week 2: Patient having mild fatigue, no pain. Patient using recommended products on skin. Very mild skin changes.      Objective:  Vitals:  There were no vitals filed for this visit.    Weight Monitoring 09/14/2019 09/15/2019 09/26/2019 09/26/2019 10/04/2019 10/14/2019 10/21/2019   Height 175.3 cm - - 175.3 cm 175.3 cm 175.3 cm -   Height Method - - - - Stated Stated -   Weight 127.824 kg 128.368 kg 127.824 kg 127.461 kg 127.007 kg 124.104 kg 122.925 kg   Weight Method - - - - Stated Standing Scale -   BMI (calculated) 41.7 kg/m2 - - 41.6 kg/m2 41.4 kg/m2 40.5 kg/m2 -     General: Comfortable, alert, NAD. Ambulating independently.  Other: Breast examination performed with female chaperone.  No skin changes.     Laboratory data: None.    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 For RT.    SARS-CoV-2 Specimen Source (no units)   Date  Value   06/08/2019 Nasopharyngeal     SARS CoV 2 Overall Result (no units)   Date Value   06/08/2019 Negative       Tolerating radiotherapy with the expected acute side effects.  Setup and imaging reviewed and approved.     Plan:  Continue radiation therapy as planned.  Pain plan: No change necessary.  Skin: Doing great with skin care.     Darrol Poke. Sibyl Parr, MD, MPharmacol.  Attending Radiation Oncologist and Medical Director  [place] [facility]  [address]  [place], [place] [address]  T [phone] [facility])  T [phone] [facility])  F [phone]  LinkedIn   [facility]

## 2019-12-15 NOTE — Progress Notes (Signed)
**  CONFIDENTIAL: MENTAL HEALTH RECORD**    [facility] Clinical Therapist Distress Screening Note        Consult    Patient Name: Leslie [name]   Date of Birth: 1970/09/06   DATE: 12/15/2019     REASON FOR CONSULT: Leslie [name] has a dx of breast cancer and was consulted by Oncology Clinical Therapist (OCT) following completion of CIDS-SF. OCT met with patient in radiation oncology.      DISTRESS SCREENING: Patient completed Chronic Illness Distress Scale-Short Form (CIDS-SF) on 11/28/19.  Pt scored 3 out of 21, with score of 0 on self-harm,  indiciating High Distress; 0 items were checked on the Problem Check List (PCL). none    CLINICAL IMPRESSION/ASSESSMENT: Met w/ Pt and her mother for a brief psychosocial assessment. According to the Pt problems w/ her port had a significant impact on her during chemo. Ongoing neuropathy affects pt's ability to walk, exercise, etc. Pt has information for financial resources but has not been able to access any resources. Explored w/ Pt how to access the resources. Pt inquiring about low cost hotels in the area as she is staying with her mother and condo is on the 4th floor. Pt not interested at this time. Will provide information if needed. Pt is aware of [facility] services as she has seen NN, therapist and social worker while in infusion. Pt provided w/ information on massage and acupuncture through [facility].    PLAN: Will remain available to the Pt.    Signature:   Loraine Leriche, LCSW

## 2019-12-19 ENCOUNTER — Ambulatory Visit
Admission: RE | Admit: 2019-12-19 | Discharge: 2019-12-19 | Disposition: A | Discharge status: Home / Self Care | Payer: Commercial Managed Care - POS | Source: Ambulatory Visit | Attending: Surgery | Admitting: Surgery

## 2019-12-19 DIAGNOSIS — C50412 Malignant neoplasm of upper-outer quadrant of left female breast: Secondary | ICD-10-CM | POA: Insufficient documentation

## 2019-12-19 DIAGNOSIS — Z510 Encounter for antineoplastic radiation therapy: Secondary | ICD-10-CM | POA: Insufficient documentation

## 2019-12-19 DIAGNOSIS — Z171 Estrogen receptor negative status [ER-]: Secondary | ICD-10-CM | POA: Insufficient documentation

## 2019-12-22 ENCOUNTER — Ambulatory Visit: Payer: Self-pay | Admitting: Radiation Oncology

## 2019-12-22 DIAGNOSIS — Z171 Estrogen receptor negative status [ER-]: Secondary | ICD-10-CM

## 2019-12-22 DIAGNOSIS — C50412 Malignant neoplasm of upper-outer quadrant of left female breast: Secondary | ICD-10-CM

## 2019-12-22 NOTE — Progress Notes (Signed)
12/22/19 1300   Radiation Therapy Patient Care - Breast    Fraction  14   Daily Dose 200   Total Dose 2800   Prescribed Dose 5000   Planned Boost  No   Concurrent Therapy no   Site left breast   Comfort Alteration   Karnofsky Performance Score 100%-normal, no complaints   Fatigue 3   Pain Score 3   Pain Loc BREAST   Pain Intervention 1-over-the-counter medications   Effectiveness of Pain Intervention 4-pain relieved 100%   Nutrition Alteration   Anorexia 0-none   Skin Alteration   Radiation Dermatitis 1-faint erythema or dry desquamation   Folliculitis No   Skin Care Gold Bond Powder; Other   Emotional Alteration   Coping 0-effective     RADIATION ONCOLOGY ON TREATMENT NOTE     Current Dose and Fraction: Please see table above.  Prescribed dose is 5000 cGy.     Subjective:  Pain: 2/10    Week 1: 2/10 soreness to left breast.  Moderate fatigue.  Using coconut oil and statamed to breast daily.  No skin changes at this time.   Week 2: Patient having mild fatigue, no pain. Patient using recommended products on skin. Very mild skin changes.   Week 3: Patient c/o mild fatigue and pain twinges. Patient using coconut oil and StrataRX, Cerave, and Triple B on skin.  Scratch that    Objective:  Vitals:  There were no vitals filed for this visit.    Weight Monitoring 09/14/2019 09/15/2019 09/26/2019 09/26/2019 10/04/2019 10/14/2019 10/21/2019   Height 175.3 cm - - 175.3 cm 175.3 cm 175.3 cm -   Height Method - - - - Stated Stated -   Weight 127.824 kg 128.368 kg 127.824 kg 127.461 kg 127.007 kg 124.104 kg 122.925 kg   Weight Method - - - - Stated Standing Scale -   BMI (calculated) 41.7 kg/m2 - - 41.6 kg/m2 41.4 kg/m2 40.5 kg/m2 -     General: Comfortable, alert, NAD. Ambulating independently.  Other: Breast examination performed with female chaperone.  No skin changes.     Laboratory data: None.    Assessment:  Problem List Items Addressed This Visit     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, estrogen  receptor negative - Primary      73F with cT2N0M0 (IIB) G3 TN IDC of the Lt breast, s/p NACT, PM/SLNBx, mammoplasty. For RT.    SARS-CoV-2 Specimen Source (no units)   Date Value   06/08/2019 Nasopharyngeal     SARS CoV 2 Overall Result (no units)   Date Value   06/08/2019 Negative       Tolerating radiotherapy with the expected acute side effects.  Setup and imaging reviewed and approved.     Plan:  Continue radiation therapy as planned.  Pain plan: Tylenol as needed for mild twinges.  Skin: Doing great with skin care.     Darrol Poke. Sibyl Parr, MD, MPharmacol.  Attending Radiation Oncologist and Medical Director  [place] [facility]  [address]  [place], [place] [address]  T [phone] [facility])  T [phone] [facility])  F [phone]  LinkedIn   [facility]

## 2019-12-29 ENCOUNTER — Ambulatory Visit: Payer: Self-pay | Admitting: Radiation Oncology

## 2019-12-29 DIAGNOSIS — C50412 Malignant neoplasm of upper-outer quadrant of left female breast: Secondary | ICD-10-CM

## 2019-12-29 DIAGNOSIS — C50912 Malignant neoplasm of unspecified site of left female breast: Secondary | ICD-10-CM

## 2019-12-29 DIAGNOSIS — Z171 Estrogen receptor negative status [ER-]: Secondary | ICD-10-CM

## 2019-12-29 NOTE — Progress Notes (Signed)
12/29/19 1100   Radiation Therapy Patient Care - Breast    Fraction  17   Daily Dose 200   Total Dose 3800   Prescribed Dose 5000   Planned Boost  No   Concurrent Therapy no   Site left breast   Comfort Alteration   Karnofsky Performance Score 100%-normal, no complaints   Fatigue 3   Pain Score 4   Pain Loc BREAST   Pain Intervention 1-over-the-counter medications   Effectiveness of Pain Intervention 0-no relief   Nutrition Alteration   Anorexia 0-none   Skin Alteration   Radiation Dermatitis 1-faint erythema or dry desquamation   Folliculitis No   Skin Care Gold Bond Powder; Other   Emotional Alteration   Coping 0-effective     RADIATION ONCOLOGY ON TREATMENT NOTE     Current Dose and Fraction: Please see table above.  Today is actually fraction 19.     Subjective:  Pain: 4/10    Week 1: 2/10 soreness to left breast.  Moderate fatigue.  Using coconut oil and statamed to breast daily.  No skin changes at this time.   Week 2: Patient having mild fatigue, no pain. Patient using recommended products on skin. Very mild skin changes.   Week 3: Patient c/o mild fatigue and pain twinges. Patient using coconut oil and StrataRX, Cerave, and Triple B on skin.  Scratch that  Week 4: Patient c/o 4/10 left breast pain. Intermittent sharp pain in inframammary fold. Moderate fatigue. Patient using coconut oil, triple B cream, cerave, GB powder on skin. Erythema noted.  Mild fatigue.  Took Tylenol earlier today for breast pain.    Objective:  Vitals:  There were no vitals filed for this visit.    Weight Monitoring 09/14/2019 09/15/2019 09/26/2019 09/26/2019 10/04/2019 10/14/2019 10/21/2019   Height 175.3 cm - - 175.3 cm 175.3 cm 175.3 cm -   Height Method - - - - Stated Stated -   Weight 127.824 kg 128.368 kg 127.824 kg 127.461 kg 127.007 kg 124.104 kg 122.925 kg   Weight Method - - - - Stated Standing Scale -   BMI (calculated) 41.7 kg/m2 - - 41.6 kg/m2 41.4 kg/m2 40.5 kg/m2 -     General: Comfortable, alert, NAD. Ambulating  independently.  Other: Breast examination performed with female chaperone.  Faint, grade 1 radiation dermatitis in field.  Pores slightly more prominent.  Mild edema.  No visible abnormalities in inframammary fold.     Laboratory data: None.    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 For RT.    SARS-CoV-2 Specimen Source (no units)   Date Value   06/08/2019 Nasopharyngeal     SARS CoV 2 Overall Result (no units)   Date Value   06/08/2019 Negative       Tolerating radiotherapy with the expected acute side effects.  Setup and imaging reviewed and approved.     Plan:  Continue radiation therapy as planned.  Pain plan: Recommend taking Tylenol/Advil 3 times a day for the next 3 days with food, for mild left breast edema and zingers.  Skin: Doing great with skin care, continue.     Darrol Poke. Sibyl Parr, MD, MPharmacol.  Attending Radiation Oncologist and Medical  Director  [place] [facility]  [address]  [place], [place] [address]  T [phone] [facility])  T [phone] [facility])  F [phone]  LinkedIn   [facility]

## 2020-01-05 ENCOUNTER — Ambulatory Visit: Payer: Self-pay | Admitting: Radiation Oncology

## 2020-01-05 DIAGNOSIS — C50412 Malignant neoplasm of upper-outer quadrant of left female breast: Secondary | ICD-10-CM

## 2020-01-05 DIAGNOSIS — Z171 Estrogen receptor negative status [ER-]: Secondary | ICD-10-CM

## 2020-01-05 NOTE — Progress Notes (Signed)
01/05/20 1100   Radiation Therapy Patient Care - Breast    Fraction  23   Daily Dose 200   Total Dose 4600   Prescribed Dose 5000   Planned Boost  No   Concurrent Therapy no   Site left breast   Comfort Alteration   Karnofsky Performance Score 100%-normal, no complaints   Fatigue 3   Pain Score 3   Pain Loc BREAST   Pain Intervention 0-none   Effectiveness of Pain Intervention 0-no relief   Nutrition Alteration   Anorexia 0-none   Skin Alteration   Radiation Dermatitis 1-faint erythema or dry desquamation   Folliculitis No   Skin Care Gold Bond Powder;Other   Emotional Alteration   Coping 0-effective     RADIATION ONCOLOGY ON TREATMENT NOTE     Current Dose and Fraction: Please see table above.  Today is actually fraction 19.     Subjective:  Pain: 4/10    Week 1: 2/10 soreness to left breast.  Moderate fatigue.  Using coconut oil and statamed to breast daily.  No skin changes at this time.   Week 2: Patient having mild fatigue, no pain. Patient using recommended products on skin. Very mild skin changes.   Week 3: Patient c/o mild fatigue and pain twinges. Patient using coconut oil and StrataRX, Cerave, and Triple B on skin.  Scratch that  Week 4: Patient c/o 4/10 left breast pain. Intermittent sharp pain in inframammary fold. Moderate fatigue. Patient using coconut oil, triple B cream, cerave, GB powder on skin. Erythema noted.  Mild fatigue.  Took Tylenol earlier today for breast pain.  Week 5: Patient c/o mild to moderate pain in left shoulder.  Has previously worked with physical therapy with good improvement, however during the last week this is worsened after a period of more exercise.  She has been intermittently taking Tylenol. Patient having moderate fatigue. Patient using Coconut oil, Triple B cream, and GB powder on skin. Erythema and darkening of skin noted.     Objective:  Vitals:  There were no vitals filed for this visit.    Weight Monitoring 09/14/2019 09/15/2019 09/26/2019 09/26/2019 10/04/2019  10/14/2019 10/21/2019   Height 175.3 cm - - 175.3 cm 175.3 cm 175.3 cm -   Height Method - - - - Stated Stated -   Weight 127.824 kg 128.368 kg 127.824 kg 127.461 kg 127.007 kg 124.104 kg 122.925 kg   Weight Method - - - - Stated Standing Scale -   BMI (calculated) 41.7 kg/m2 - - 41.6 kg/m2 41.4 kg/m2 40.5 kg/m2 -     General: Comfortable, alert, NAD. Ambulating independently.  Other: Breast examination performed with female chaperone.  Faint, grade 1 radiation dermatitis in field.  Pores slightly more prominent.  Mild edema.  No visible abnormalities in inframammary fold.  Grade 2 hyperpigmentation over clavicle, without breakdown.  Decreased range of motion in left shoulder.     Laboratory data: None.    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 For RT.    SARS-CoV-2 Specimen Source (no units)   Date Value   06/08/2019 Nasopharyngeal     SARS CoV 2 Overall Result (no units)   Date Value   06/08/2019 Negative       Tolerating radiotherapy  with the expected acute side effects.  Setup and imaging reviewed and approved.     Plan:  Continue radiation therapy as planned.  Pain plan: Recommend taking Tylenol/Advil 3 times a day for the next 3-5 days with food, for left shoulder pain, and to continue with her exercises.  Offered reengagement with physical therapy, patient declined.  Skin: Doing great with skin care, continue.  Follow-up: I would typically see the patient 6 weeks after treatment for skin check.  The patient is in the process of moving to [place], and asked if it was okay to follow-up with me in March, when she sees Dr. Cyndi Bender.  I do feel that that would be reasonable, and the patient will call me with any questions or concerns in the interim.  She also has scheduled follow-up with Dr. Ladell Heads in  December.     Darrol Poke. Sibyl Parr, MD, MPharmacol.  Attending Radiation Oncologist and Medical Director  [place] [facility]  [address]  [place], [place] [address]  T [phone] [facility])  T [phone] [facility])  F [phone]  LinkedIn   [facility]

## 2020-01-06 NOTE — Progress Notes (Signed)
[facility] Social Worker Case Manager   01/06/2020    Follow up Note:     SW met with patient after her radiation treatment to discuss current concerns of financial stress. Patient has applied for long term disability with her job. Partial income will be supplemented into her household monthly now.     Patient has relocated to be with her mother, which has helped with her treatment, as her mother is closer to [place].     SW continued to support the patient and provide additional financial resources foundations that are available and encouraged the patient to apply for open grants.     Patient is thankful for the ongoing support and encouragement.     Linus Orn, MSW   [facility] Social Worker Case Manager   [phone]

## 2020-01-09 ENCOUNTER — Encounter: Payer: Self-pay | Admitting: Radiation Oncology

## 2020-01-09 NOTE — Patient Instructions (Signed)
Department of Radiation Oncology  End of Radiation Treatment Instructions    You have completed 25 radiation treatments to your breast beginning on 12/05/2019 and ending on 01/09/2020.  You were treated by Dr. Sibyl Parr.  Your follow up appointment is 6 weeks.  Please call us at [phone] if you have any questions or concerns.    We will send a summary of your radiation treatment to all of the physicians you have told us are involved in your care.  Please make a follow-up appointment with your referring physician, and any others as appropriate.      SKIN  Irritation or skin problems continue for several weeks.  Be GENTLE with the skin in the treatment area until problems are gone.  Dryness may continue and could be permanent.      1.   Continue any treatment we have prescribed until the skin is clearly improving  2.   You may moisturize the treatment area with your preferred lotion or cream.  3.   If skin develops wet desquamation (weeping skin) - call for instructions.  4.   Minimize sun exposure of the treated area.  Use a SUNSCREEN protector with a SPF factor of 30 or higher.    5   Do not use heating pads or ice packs on treated area until redness had subsided.    ACTIVITY   Fatigue, feelings of weakness or weariness experienced during treatment usually resolve over 4-6 weeks or longer.  Resume or continue your usual daily routine activities as tolerated.  If you feel tired, limit activities and rest as needed.    DIET  Continue to eat a balanced, healthy diet.  Your body is recovering from treatment and needs good nutrition.  If you are losing weight without trying to do so, you need more nutrients.  Add two or three can of the nutritional supplement of your choice each day.    PAIN  If you have pain, continue to take your pain medication as needed. Some pain medications cannot be refilled without a new written prescription.  Pain can continue to diminish for several weeks after radiation is complete.  If  you are not sure about your pain medication, consult with your prescribing physician.    MEDICATION   Continue taking your current medications unless instructed otherwise.  For refills, it is generally best to call the office of the prescribing physician.      Shaune Leeks RN, OCN, BSN  [phone]

## 2020-01-17 NOTE — Progress Notes (Addendum)
RADIATION ONCOLOGY TREATMENT COMPLETION NOTE    Patient: [name]  DOB: 1971-01-14  MRN: [id number]      Dear Dr. Ladell Heads :    Our mutual patient, Leslie [name], now completes a course of radiation therapy as described below. As you will recall, she is a 49 y.o. with a screen detectedcT2N0M0 (IIB)grade 3 invasive ductal carcinoma of the left lateral breast, triple negative. She is status post neoadjuvant chemotherapy followed by partial mastectomy/sentinel lymph node biopsy (pCR).  She completed adjuvant radiotherapy in our department on January 09, 2020.     TREATMENT DELIVERED      DIAGNOSIS: Invasive Ductal carcinoma Left Breast  STAGE: cT2N0M0 (IIB)grade 3   INTENT: Curative  DATES:  12/05/19-01/09/20               SPECIAL TECHNICAL FACTORS   Patient was simulated supine on a breast board.    Concurrent chemotherapy: No    TOLERANCE      Over the course of treatment, she experienced the following:    Pain: 4/10    Week 1: 2/10 soreness to left breast.  Moderate fatigue.  Using coconut oil and statamed to breast daily.  No skin changes at this time.   Week 2: Patient having mild fatigue, no pain. Patient using recommended products on skin. Very mild skin changes.   Week 3: Patient c/o mild fatigue and pain twinges. Patient using coconut oil and StrataRX, Cerave, and Triple B on skin.  Scratch that  Week 4: Patient c/o 4/10 left breast pain. Intermittent sharp pain in inframammary fold. Moderate fatigue. Patient using coconut oil, triple B cream, cerave, GB powder on skin. Erythema noted.  Mild fatigue.  Took Tylenol earlier today for breast pain.  Week 5: Patient c/o mild to moderate pain in left shoulder.  Has previously worked with physical therapy with good improvement, however during the last week this is worsened after a period of more exercise.  She has been intermittently taking Tylenol. Patient having moderate fatigue. Patient using Coconut oil, Triple B cream, and GB powder on skin.  Erythema and darkening of skin noted.     DISPOSITION      Pain plan: Recommend taking Tylenol/Advil 3 times a day for the next 3-5 days with food, for left shoulder pain, and to continue with her exercises.  Offered reengagement with physical therapy, patient declined.  Skin: Doing great with skin care, continue.  Follow-up: I would typically see the patient 6 weeks after treatment for skin check.  The patient is in the process of moving to [place], and asked if it was okay to follow-up with me in March, when she sees Dr. Cyndi Bender.  I do feel that that would be reasonable, and the patient will call me with any questions or concerns in the interim.  She also has scheduled follow-up with Dr. Ladell Heads in December    Thank you again for involving me in the care of Leslie [name]. Please feel free to call me with any questions or concerns.     Darrol Poke. Sibyl Parr, MD, MPharmacol.  Attending Radiation Oncologist   [facility]  [address]  [place], [place] [address]  T [phone]  F [phone]  LinkedIn   [facility]

## 2020-01-23 ENCOUNTER — Telehealth: Payer: Self-pay | Admitting: Internal Medicine

## 2020-01-23 ENCOUNTER — Ambulatory Visit: Payer: Commercial Managed Care - POS | Attending: Internal Medicine | Admitting: Internal Medicine

## 2020-01-23 ENCOUNTER — Encounter: Payer: Self-pay | Admitting: Internal Medicine

## 2020-01-23 ENCOUNTER — Other Ambulatory Visit (FREE_STANDING_LABORATORY_FACILITY): Payer: Commercial Managed Care - POS

## 2020-01-23 VITALS — BP 120/78 | HR 86 | Temp 98.2°F | Resp 19 | Ht 69.0 in | Wt 271.0 lb

## 2020-01-23 DIAGNOSIS — T451X5A Adverse effect of antineoplastic and immunosuppressive drugs, initial encounter: Secondary | ICD-10-CM

## 2020-01-23 DIAGNOSIS — C50412 Malignant neoplasm of upper-outer quadrant of left female breast: Secondary | ICD-10-CM

## 2020-01-23 DIAGNOSIS — Z171 Estrogen receptor negative status [ER-]: Secondary | ICD-10-CM

## 2020-01-23 DIAGNOSIS — D539 Nutritional anemia, unspecified: Secondary | ICD-10-CM

## 2020-01-23 DIAGNOSIS — G620 Drug-induced polyneuropathy: Secondary | ICD-10-CM

## 2020-01-23 LAB — CBC AND DIFFERENTIAL
Absolute NRBC: 0 10*3/uL (ref 0.00–0.00)
Basophils Absolute Automated: 0.01 10*3/uL (ref 0.00–0.08)
Basophils Automated: 0.2 %
Eosinophils Absolute Automated: 0.08 10*3/uL (ref 0.00–0.44)
Eosinophils Automated: 1.8 %
Hematocrit: 36.8 % (ref 34.7–43.7)
Hgb: 11.7 g/dL (ref 11.4–14.8)
Immature Granulocytes Absolute: 0.01 10*3/uL (ref 0.00–0.07)
Immature Granulocytes: 0.2 %
Lymphocytes Absolute Automated: 0.8 10*3/uL (ref 0.42–3.22)
Lymphocytes Automated: 17.9 %
MCH: 27 pg (ref 25.1–33.5)
MCHC: 31.8 g/dL (ref 31.5–35.8)
MCV: 84.8 fL (ref 78.0–96.0)
MPV: 10 fL (ref 8.9–12.5)
Monocytes Absolute Automated: 0.55 10*3/uL (ref 0.21–0.85)
Monocytes: 12.3 %
Neutrophils Absolute: 3.01 10*3/uL (ref 1.10–6.33)
Neutrophils: 67.6 %
Nucleated RBC: 0 /100 WBC (ref 0.0–0.0)
Platelets: 149 10*3/uL (ref 142–346)
RBC: 4.34 10*6/uL (ref 3.90–5.10)
RDW: 16 % — ABNORMAL HIGH (ref 11–15)
WBC: 4.46 10*3/uL (ref 3.10–9.50)

## 2020-01-23 LAB — HEMOLYSIS INDEX: Hemolysis Index: 7 (ref 0–24)

## 2020-01-23 LAB — FOLATE: Folate: 5.3 ng/mL

## 2020-01-23 LAB — IRON PROFILE
Iron Saturation: 22 % (ref 15–50)
Iron: 57 ug/dL (ref 40–145)
TIBC: 262 ug/dL — ABNORMAL LOW (ref 265–497)
UIBC: 205 ug/dL (ref 126–382)

## 2020-01-23 LAB — VITAMIN B12: Vitamin B-12: 399 pg/mL (ref 211–911)

## 2020-01-23 LAB — FERRITIN: Ferritin: 351.6 ng/mL — ABNORMAL HIGH (ref 4.60–204.00)

## 2020-01-23 NOTE — Addendum Note (Signed)
Addended by: Selena Batten on: 01/23/2020 02:05 PM     Modules accepted: Orders

## 2020-01-23 NOTE — Telephone Encounter (Signed)
Cbc, B12, folate, ferritin, iron profile today   ECHO now and 05/2020    RTC 6 m

## 2020-01-23 NOTE — Telephone Encounter (Signed)
Patient has been placed on the waitlist and given ECHO orders. Patient will schedule with  heart.   Thank you

## 2020-01-23 NOTE — Progress Notes (Signed)
[facility]  [address] [name]  [phone]      [name] Office  [phone]  Visit Date: 01/23/2020    Chief Complaint   Patient presents with    Follow-up     continuation of managed care for breast cancer 0/10 pain level today          HISTORY OF PRESENT ILLNESS:  Overall doing well.  Still have mild neuropathy hands and feet.  The never started the duloxetine as she was concerned about side effect of the medication.  Taking B12 but not iron.    Oncology History Overview Note   Physician Requesting Consult: de Lawson Radar, Dairl Ponder, MD    Breast Surgeon: Orlene Och, MD  Radiation Oncologist:  Plastic Surgeon:   PCP: Satira Mccallum, MD      Leslie [name] is a 49 y.o.-year-old female with newly diagnosed IDC of the left breast.   She presented with abnormal mammogram.     Bilateral screening mammogram 02/2019 showed left breast 3.1 x 2 cm hypoechoic mass and midly enlarged axillary node abnormality.     CNB left breast mass 2 OC and left axillary node 03/07/2019 [facility]) showed:   A) Breast: IDC, G3, ER 0%, PR 0%, Her 2 neu negative (1+) by IHC, Ki 67 80%  B) B) Lymph node, left axillary: benign-appearing lymph node.    Bilateral breast MRI and Genetic consult ordered. Path review pending.       Breast Cancer Risk Factors:  Age at Menarche:  71  Age at 37st FTP:  74    Still menstruating?:  No   Last menses: 2008   Hysterectomy: Yes 2008 TAH+ left oophorectomy   Estrogen Therapy:          Oral Contraceptives: No           Hormone Replacement Therapy:  No         Reproductive Assistance Therapies:  No  Family History of Cancer:            Breast:   Yes maternal aunt age 53, maternal cousin age 15, paternal cousin age 45,       Ashkenazi Jewish Ancestry: No     Genetic testing: pending       Denies F/C/N/V/diarrhea. No CP/SOB. No HA/visual changes/focal weakness.        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, estrogen receptor negative     04/14/2019 - 06/09/2019 Chemotherapy    cyclophosphamide (CYTOXAN) 1,500 mg in sodium chloride 0.9 % 360 mL chemo infusion, 1,554 mg, Intravenous, Once, 4 of 4 cycles  Administration: 1,500 mg (04/14/2019), 1,500 mg (04/28/2019), 1,500 mg (05/12/2019), 1,500 mg (06/09/2019)  pegfilgrastim (NEULASTA) injection 6 mg, 6 mg, Subcutaneous, Once, 2 of 2 cycles  Administration: 6 mg (04/14/2019), 6 mg (04/29/2019)  pegfilgrastim (NEULASTA) delivery kit 6 mg, 6 mg, Subcutaneous, Once, 3 of 3 cycles  Administration: 6 mg (04/28/2019), 6 mg (05/12/2019), 6 mg (06/09/2019)  DOXOrubicin (ADRIAMYCIN) 155 mg in sodium chloride 0.9 % 362.5 mL chemo infusion, 60 mg/m2 = 155 mg, Intravenous, Once, 4 of 4 cycles  Administration: 155 mg (04/14/2019), 155 mg (04/28/2019), 155 mg (05/12/2019), 155 mg (06/09/2019)     06/23/2019 -  Chemotherapy    CARBOplatin (PARAPLATIN) 225 mg in sodium chloride 0.9 % 307.5  mL chemo infusion, 225 mg, Intravenous, Once, 2 of 2 cycles  Administration: 225 mg (06/23/2019), 225 mg (06/30/2019), 225 mg (07/07/2019), 225 mg (07/14/2019)  PACLitaxel (TAXOL) 210 mg in sodium chloride 0.9 % 320 mL chemo infusion, 80 mg/m2 = 210 mg, Intravenous, Once, 4 of 4 cycles  Dose modification: 64 mg/m2 (80 % of original dose 80 mg/m2, Cycle 4, Reason: Dose Not Tolerated)  Administration: 210 mg (06/23/2019), 210 mg (06/30/2019), 210 mg (07/07/2019), 210 mg (07/14/2019), 210 mg (07/28/2019), 210 mg (08/04/2019), 210 mg (08/11/2019), 210 mg (08/18/2019), 210 mg (08/25/2019), 210 mg (09/01/2019), 168 mg (09/15/2019)     10/14/2019 Surgery    S/p left lumpectomy/SLNBx     10/31/2019 Cancer Staged    Staging form: Breast, AJCC 8th Edition  - Clinical stage from 10/31/2019: Stage IIB (cT2, cN0, cM0, G3, ER-, PR-, HER2-) - Signed by [name], MD on 10/31/2019       10/31/2019 Cancer Staged    Staging form: Breast, AJCC 8th Edition  - Pathologic  stage from 10/31/2019: No Stage Recommended (ypT0, pN0(sn), cM0) - Signed by [name], MD on 10/31/2019       12/05/2019 - 01/09/2020 Radiation    Radiation        Cancer Staging  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, estrogen receptor negative  Staging form: Breast, AJCC 8th Edition  - Clinical stage from 10/31/2019: Stage IIB (cT2, cN0, cM0, G3, ER-, PR-, HER2-) - Signed by [name], MD on 10/31/2019  Stage prefix: Initial diagnosis  Method of lymph node assessment: Clinical  Histologic grading system: 3 grade system  Stage used in treatment planning: Yes  National guidelines used in treatment planning: Yes  Type of national guideline used in treatment planning: NCCN  - Pathologic stage from 10/31/2019: No Stage Recommended (ypT0, pN0(sn), cM0) - Signed by [name], MD on 10/31/2019  Stage prefix: Post-therapy  Response to neoadjuvant therapy: Complete response  Method of lymph node assessment: Sentinel lymph node biopsy  Stage used in treatment planning: Yes  National guidelines used in treatment planning: Yes  Type of national guideline used in treatment planning: NCCN      Past Medical History:   Diagnosis Date    Anemia     iron deficient, recent chemo//takes po iron supplement    Clot     mediport site r chest, on elliquis    Fibroid     Gastroesophageal reflux disease     indigestion while on chemo    Herpes simplex without mention of complication 2003    possible recurrance in 2013//no issuses since    Hypertension     well controlled w/ medication    Lymphedema of extremity 11/03/2017    L foot    Malignant neoplasm of breast 02/2019    L breast ca, s/p chemo 08/2019, no radiation    Neurologic disorder     brachial neuritis    Neuropathy     recednt chemo/hands/feet    Obesity     Vitamin D deficiency      Past Surgical History:   Procedure Laterality Date    BIOPSY, SENTINEL NODE Left 10/14/2019    Procedure: BIOPSY, SENTINEL NODE;  Surgeon: Boykin Peek, MD;  Location: [facility];  Service: General;  Laterality: Left;    BREAST, LUMPECTOMY Left 10/14/2019    Procedure: BREAST, LUMPECTOMY, WITH MAG SEED LOCALIZATION;  Surgeon: Boykin Peek, MD;  Location: [facility];  Service: General;  Laterality: Left;    HYSTERECTOMY  2008    fibroids, readmitted for pelvic abscess formation POD#3    MAMMOPLASTY, REDUCTION (MEDICAL) Right 10/14/2019    Procedure: MAMMOPLASTY, REDUCTION (MEDICAL);  Surgeon: Felipe Drone, MD;  [name];  Service: Plastics;  Laterality: Right;    MAMMOPLASTY/ONCOPLASTY Left 10/14/2019    Procedure: MAMMOPLASTY/ONCOPLASTY;  Surgeon: Felipe Drone, MD;  Location: [facility];  Service: Plastics;  Laterality: Left;  Right Wt: 1318g  Left Wt: 1319g    MEDIPORT CHECK Right 06/08/2019    Procedure: MEDIPORT CHECK;  Surgeon: Rex Kras, MD;  [facility];  Service: Interventional Radiology;  Laterality: Right;    MEDIPORT PLACEMENT N/A 04/08/2019    Procedure: MEDIPORT PLACEMENT;  Surgeon: Pollyann Kennedy, MD;  Location: [facility];  Service: Interventional Radiology;  Laterality: N/A;    OTHER SURGICAL HISTORY  05/04/2015    Obalon balloon - have 3 balloons placed in stomach for weight loss, Dr. Georgann Housekeeper    REMOVAL, MEDIPORT Right 10/14/2019    Procedure: REMOVAL, MEDIPORT;  Surgeon: Boykin Peek, MD;  Location: [facility];  Service: General;  Laterality: Right;    SALPINGOOPHORECTOMY Right 2008    RSO with hysterectomy    TUBAL LIGATION  02/17/1997     Social History     Socioeconomic History    Marital status: Legally Separated     Spouse name: None    Number of children: None    Years of education: None    Highest education level: None   Occupational History    None   Tobacco Use    Smoking status: Never Smoker    Smokeless tobacco: Never Used   Vaping Use    Vaping Use: Never used   Substance and Sexual Activity    Alcohol use: Not Currently    Drug use: Not  Currently     Comment: CBD oil/last use month ago    Sexual activity: Yes     Partners: Male     Birth control/protection: Surgical   Other Topics Concern    None   Social History Narrative    Has elliptical machine        Lives with boyfriend, daughter home        Son died on 01/27/2019 in CCU            Mom staying with her, good support system 05/30/2019     Social Determinants of Health     Financial Resource Strain:     Difficulty of Paying Living Expenses: Not on file   Food Insecurity:     Worried About Programme researcher, broadcasting/film/video in the Last Year: Not on file    [employer] of Food in the Last Year: Not on file   Transportation Needs:     Lack of Transportation (Medical): Not on file    Lack of Transportation (Non-Medical): Not on file   Physical Activity: Insufficiently Active    Days of Exercise per Week: 7 days    Minutes of Exercise per Session: 20 min   Stress:     Feeling of Stress : Not on file   Social Connections:     Frequency of Communication with Friends and Family: Not on file    Frequency of Social Gatherings with Friends and Family: Not on file    Attends Religious Services: Not on file    Active Member of Clubs  or Organizations: Not on file    Attends Banker Meetings: Not on file    Marital Status: Not on file   Intimate Partner Violence:     Fear of Current or Ex-Partner: Not on file    Emotionally Abused: Not on file    Physically Abused: Not on file    Sexually Abused: Not on file   Housing Stability:     Unable to Pay for Housing in the Last Year: Not on file    Number of Places Lived in the Last Year: Not on file    Unstable Housing in the Last Year: Not on file      Family History   Problem Relation Age of Onset    Hypertension Mother     Coronary artery disease Mother     Heart disease Mother     Hyperlipidemia Mother     Hypertension Father     Diabetes Father     Other Father         cardiac catheterization    Hypertension Sister     Breast cancer  Maternal Aunt     Uterine cancer Maternal Aunt     Vaginal cancer Maternal Aunt     Breast cancer Other         maternal cousin    Ovarian cancer Neg Hx        Allergies   Allergen Reactions    Gabapentin Facial Swelling     Eye swelling    Valsartan Other (See Comments)     achiness    Penicillins Hives     Symptoms occurred as child  Onset date: 07-28-2004     has a current medication list which includes the following prescription(s): acetaminophen, ibuprofen, losartan-hydrochlorothiazide, vitamin b-12, and omeprazole.        REVIEW OF SYSTEMS: 14 pt ROS reviewed with the patient and negative except for as documented in the HPI. All other systems reviewed.      PHYSICAL EXAM:  BP 120/78 (BP Site: Right arm, Patient Position: Sitting, Cuff Size: Medium)    Pulse 86    Temp 98.2 F (36.8 C) (Oral)    Resp 19    Ht 1.753 m (5\' 9" )    Wt 122.9 kg (271 lb)    SpO2 100%    BMI 40.02 kg/m         Performance Status:  1 - Restricted in physically strenuous activity but ambulatory and able to carry out work of a light or sedentary nature, e.g., light house work      General Appearance: Alert, cooperative, no distress, appears stated age  Eyes: conjunctiva/corneas clear bilaterally  Head:  Normocephalic, without obvious abnormality, atraumatic  Neck: Supple, symmetrical, trachea midline, no adenopathy  Lungs: Clear to auscultation bilaterally, respirations unlabored  Chest Wall: No tenderness or deformity  Heart: Regular rate and rhythm, S1 and S2 normal, no murmur, rub or gallop  Breast Exam: Left partial mastectomy with no evidence of local recurrence and normal right breast  Abdomen: Soft, non-tender, no masses, no organomegaly  Extremities: Extremities normal, atraumatic, no cyanosis or edema  Lymph Nodes:  Cervical, supraclavicular, and axillary nodes normal      Labs Results:  Reviewed in epic today  Lab Results   Component Value Date    WBC 6.84 09/15/2019    HGB 10.2 (L) 09/15/2019    HCT 31.2 (L)  09/15/2019    MCV 95.1 09/15/2019    PLT 192 09/15/2019  Lab Results   Component Value Date    CREAT 1.2 09/15/2019    BUN 13.0 09/15/2019    NA 134 (L) 09/15/2019    K 4.1 09/15/2019    CL 106 09/15/2019    CO2 30 (H) 09/15/2019     Lab Results   Component Value Date    ALT 30 09/15/2019    AST 30 09/15/2019    ALKPHOS 82 08/18/2019    BILITOTAL 0.3 08/18/2019       Imaging Results: none this visit      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    C1D1 AC on 04/14/2019.   C4D1 AC on 06/09/2019.  Started carbotaxol on 06/23/2019. Was unable to tolerate.   Dropped carbo and continued with single agent paclitaxelstarting withC2D8. Patient decided to stop chemotherapy with once one infusion left (09/15/2019) as she developed severe neuropathic pain hands and feet.    Followed by cardio-oncology.  BaselineECHO2/23/2021 showed EF 66% and GLS -19%.  ECHOpost C#2 AC 3/17/2021showed EF 64% and GLS -19%.  Echo post C4 AC- 4/19/ 21 showed EF 64% GLS -19. %  6 m post AC ECHO due 11/2019.not done. To schedule now and then in 05/2020 (12 m post [facility])     S/p left lumpectomy/SLNBx 10/14/2019. Final pathology showed ypT0, ypNo.   Completed from 11/29/2019 to 01/09/2020.     RTC in 6 m       CIPN:   obs. Did not start duloxetine     Vitamin B12 deficiency   On 1000 mcg sublingual)   Repeat levels today     Iron deficiency   Not taking Vitron C   Repeat levels today     CVLassociated DVT  PORT removed. eliquis stopped.       [name], MD

## 2020-01-23 NOTE — Telephone Encounter (Signed)
Orders in chart.     Front -   -CBC, B12, folate, ferritin, iron profile today  -ECHO now   -ECHO 05/2020  -RTC in 6 months   Thank you!

## 2020-01-24 ENCOUNTER — Telehealth: Payer: Self-pay | Admitting: Internal Medicine

## 2020-01-24 NOTE — Telephone Encounter (Signed)
Spoke to patient on phone. Per Dr. Ladell Heads, iron and B12 are not low. Can stop B12 and no need for iron. Patient verbally understands. Patient had no further questions.

## 2020-01-24 NOTE — Telephone Encounter (Signed)
-----   Message from [name], MD sent at 01/24/2020 11:06 AM EST -----  Please let her know that iron and B12 are not low.   Can stop B12.   No need to start iron.

## 2020-01-27 ENCOUNTER — Ambulatory Visit
Admission: RE | Admit: 2020-01-27 | Discharge: 2020-01-27 | Disposition: A | Discharge status: Home / Self Care | Payer: Commercial Managed Care - POS | Source: Ambulatory Visit | Attending: Internal Medicine | Admitting: Internal Medicine

## 2020-01-27 DIAGNOSIS — C50412 Malignant neoplasm of upper-outer quadrant of left female breast: Secondary | ICD-10-CM | POA: Insufficient documentation

## 2020-01-27 DIAGNOSIS — R931 Abnormal findings on diagnostic imaging of heart and coronary circulation: Secondary | ICD-10-CM | POA: Insufficient documentation

## 2020-01-27 DIAGNOSIS — I10 Essential (primary) hypertension: Secondary | ICD-10-CM | POA: Insufficient documentation

## 2020-01-27 DIAGNOSIS — Z171 Estrogen receptor negative status [ER-]: Secondary | ICD-10-CM

## 2020-01-27 LAB — ECHO ADULT TTE LMTD 2D M-MODE CLR DOP WAV LMTD W MYOCARD STRAIN
BP Mod LV Ejection Fraction: 67.6
GLS Average: -17.9
IVS Diastolic Thickness (2D): 1.17
LA Volume Index (BP A-L): 0.02
LVID diastole (2D): 4.45
LVID systole (2D): 2.74
MV Area (PHT): 4.436
MV E/A: 0.896
MV E/A: 0.9
MV E/e' (Average): 9.139
RV Basal Diastolic Dimension: 4.08
RV Systolic Pressure: 23
RV Systolic Pressure: 23.25
TAPSE: 2.38

## 2020-02-05 NOTE — Telephone Encounter (Signed)
error 

## 2020-02-07 ENCOUNTER — Telehealth: Payer: Self-pay | Admitting: Internal Medicine

## 2020-02-07 DIAGNOSIS — Z171 Estrogen receptor negative status [ER-]: Secondary | ICD-10-CM

## 2020-02-07 DIAGNOSIS — C50412 Malignant neoplasm of upper-outer quadrant of left female breast: Secondary | ICD-10-CM

## 2020-02-07 NOTE — Telephone Encounter (Signed)
Spoke to patient on phone. Gave recent ECHO results. Per Dr. Ladell Heads to follow up with cardio-onc in regards to mild reduction GLS. Patient verbally understands. Patient had no further question.     Front -  -please assist with referral for cardio-onc. Order in chart.   Thank you!

## 2020-02-07 NOTE — Addendum Note (Signed)
Addended by: Selena Batten on: 02/07/2020 03:08 PM     Modules accepted: Orders

## 2020-02-07 NOTE — Telephone Encounter (Signed)
LMTCB 12/21 

## 2020-02-07 NOTE — Telephone Encounter (Signed)
-----   Message from [name], MD sent at 02/02/2020  3:27 PM EST -----  Please inform patient ECHO showed mild reduction GLS. Please make referral to cardio-oncology

## 2020-02-09 ENCOUNTER — Encounter (INDEPENDENT_AMBULATORY_CARE_PROVIDER_SITE_OTHER): Payer: Self-pay | Admitting: Residents

## 2020-02-09 NOTE — Telephone Encounter (Signed)
Front -  Email request submitted for scheduling-please assist with referral for cardio-onc. Order in chart.

## 2020-02-14 ENCOUNTER — Encounter (INDEPENDENT_AMBULATORY_CARE_PROVIDER_SITE_OTHER): Payer: Self-pay

## 2020-02-15 NOTE — Telephone Encounter (Signed)
Per [facility] Cardiology-Oncology-- I just heard back from the office of her Cardio-Oncologist, Dr. Carlyn Reichert. Ms. Mines wants to stick with her 04/26/20 appointment with Dr. Jodelle Gross at the [facility] office of [facility].  It is difficult for her to get back to the [place] office and her time is extremely limited.

## 2020-03-19 ENCOUNTER — Telehealth (INDEPENDENT_AMBULATORY_CARE_PROVIDER_SITE_OTHER): Payer: Self-pay | Admitting: Family Medicine

## 2020-03-19 ENCOUNTER — Encounter (INDEPENDENT_AMBULATORY_CARE_PROVIDER_SITE_OTHER): Payer: Commercial Managed Care - POS | Admitting: Family Medicine

## 2020-03-19 DIAGNOSIS — I10 Essential (primary) hypertension: Secondary | ICD-10-CM

## 2020-03-19 MED ORDER — LOSARTAN POTASSIUM-HCTZ 100-25 MG PO TABS
1.0000 | ORAL_TABLET | Freq: Every day | ORAL | 0 refills | Status: DC
Start: 2020-03-19 — End: 2020-04-30

## 2020-03-19 NOTE — Telephone Encounter (Signed)
Patient had VV on 03/19/20 but was not in state of [place].  She will run out of medication in a few days and needed temp refill sent into pharmacy in [place].  She scheduled follow up for 04/25/20.  Temp refill sent to pharmacy as requested

## 2020-04-17 ENCOUNTER — Ambulatory Visit
Admission: RE | Admit: 2020-04-17 | Discharge: 2020-04-17 | Disposition: A | Discharge status: Home / Self Care | Payer: Commercial Managed Care - POS | Source: Ambulatory Visit | Attending: Surgery | Admitting: Surgery

## 2020-04-17 DIAGNOSIS — Z171 Estrogen receptor negative status [ER-]: Secondary | ICD-10-CM | POA: Insufficient documentation

## 2020-04-17 DIAGNOSIS — C50812 Malignant neoplasm of overlapping sites of left female breast: Secondary | ICD-10-CM | POA: Insufficient documentation

## 2020-04-24 NOTE — Progress Notes (Signed)
[facility] Breast Surgery Breast Cancer Follow Up    Treatment Team  Ref Prov:Kc, Lilly, MD   Primary Care Provider:  Satira Mccallum, MD Radiology facility:[facility])--[phone] Breast Surgeon :Henderson Baltimore MD   Plastic Harvie Bridge, MD [phone] Radiation Onc:  Miguel Dibble MD: [phone] Morene Antu McLean); Medical Onc:  [name], MD      Breast Cancer Comprehensive Care Plan Summary  Date of diagnosis:03/2019 Event :Primary Breast Cancer ECOG Performance:Grade 0 (Fully active) Genetic Testing:genetics consultation requested   Presentation :palpable breast lump Side:left Focality: unifocal Location:radian3:00   Biologic Tumor characteristics  Tumor:Invasive Ductal Carcinoma Grade:nuclear grade III Ki-67:80% Oncotype Dx:  not indicated   Estrogen Receptor:negative Progesterone Receptor:negative Her-2-neu Receptor:1+ , negative    Staging  Tumor Size:  4.0cm      pathCR Nodes:clinically/US negative      0/2 on surgical path Systemic Metastases:clinically negative Stage:Stage IIA , T2 N0 M0    ypT0 ypN0   Treatment  Modality Date    Surgery   10/14/19 left lumpectomy with sentinel node biopsy and bilateral mammoplasty   Radiation completed 01/09/20    Endocrine     Chemotherapy completed 09/15/19 neoadjuvant AC-TC   Biologic     Clinical trial         HPI   CC: Follow up left breast cancer    Ms Leslie [name] is a 50 y.o. female patient here for follow up of her history of left breast cancer originally diagnosed in February 2021.  She is currently on Serial imaging    The patient is asymptomatic and denies any breast pain , palpable masses, skin or nipple changes or nipple discharge.     She does report persistent left leg pain since December. It is worse at night and it is affecting her walking.     Recent Imaging:  BDM 04/25/20: Postsurgical changes in both breasts with no mammographic  evidence of malignancy in either breast.  Unless clinically indicated  sooner, annual bilateral mammogram is recommended in one year.  BIRADS2    The following portions of the patient's history were reviewed and updated as appropriate: allergies, current medications, past family history, past medical history, past social history, past surgical history and problem list.     has a past medical history of Anemia, Clot, Fibroid, Gastroesophageal reflux disease, Herpes simplex without mention of complication (2003), Hypertension, Lymphedema of extremity (11/03/2017), Malignant neoplasm of breast (02/2019), Neurologic disorder, Neuropathy, Obesity, and Vitamin D deficiency.    Family History     Family History   Problem Relation Age of Onset   . Hypertension Mother    . Coronary artery disease Mother    . Heart disease Mother    . Hyperlipidemia Mother    . Hypertension Father    . Diabetes Father    . Other Father         cardiac catheterization   . Hypertension Sister    . Breast cancer Maternal Aunt    . Uterine cancer Maternal Aunt    . Vaginal cancer Maternal Aunt    . Breast cancer Other         maternal cousin   . Ovarian cancer Neg Hx         Review of Systems   ROS:  Constitutional:  negative  HEENT:  negative  Cardiovascular:  pain with walking  Respiratory:  negative  Gastrointestinal:  negative  Genitourinary:  negative  Musculoskeletal:  negative  Skin:  negative  Neurologic:  tingling  Psychiatric:  negative  Endocrine:  negative  Hematologic:  negative               Physical Exam   BP 108/76 (BP Site: Left arm, Patient Position: Sitting)   Pulse 89   Temp 97.3 F (36.3 C) (Temporal)   Resp 16   Ht 1.753 m (5\' 9" )   Wt 123.8 kg (273 lb)   SpO2 100%   BMI 40.32 kg/m     WDWN female in NAD  HEENT:  Clear, no scleral icterus, neck supple  Neck:  No thyromegaly or masses, trachea midline  Chest: Respiratory effort normal  BJM:  Gait and station normal  Ext:  Decreased ROM left shoulder.   Skin:  Free of significant ulcers or lesions  Neuro:   Grossly intact, no focal findings, alert and oriented, asks appropriate questions  LN:  No axillary, supraclavicular or cervical adenopathy  Breast:  Symmetric . Ptosis 1   Right breast: No skin or nipple changes, no nipple retraction or discharge . No palpable masses. Well healed incision.    Left breast : No skin or nipple changes, no nipple retraction or discharge . No palpable masses. Well healed incision. Lymphedema r/t post-radiation change and mild skin thickening.     Rads   Radiological imaging and reports reviewed as above.     Assessment/Plan   1. Personal history of breast carcinoma    Patient is doing well. She currently has no evidence of disease .  Her physical and emotional recovery is good. Cosmetic outcome is good .  I have recommended continued treatment with Serial imaging    Surveillance / Folllow up recommendations:    I reviewed the importance of breast exams and follow up at six month intervals for the first five years after the first treatment; alternating exams at 6 month intervals with medical oncology is a good option between years three and five to limit the number of times needed for follow up ; and every year thereafter.  I have recommended long term follow-up because there is a low but measurable risk of breast cancer recurrence even more than 10 years after treatment. In addition, if  there is remaining breast tissue, there is a higher risk to develop a new, independent, breast cancer at some time in the future.    After lumpectomy, we recommend mammograms once a year. The purpose is to monitor for local recurrence and detection of new primary.    Patient is advised to report these symptoms : new lumps, bone pain, chest pain, shortness of breath or difficulty breathing, abdominal pain, or persistent headaches.     The following tests are not recommended for routine breast cancer follow-up: breast MRI, FDG-PET scans, complete blood cell counts, automated chemistry studies, chest  x-rays, bone scans, liver ultrasound, and tumor markers (CA 15-3, CA 27.29, CEA).     Nutrition  I have also discussed life-style choices to decrease risk of breast cancer such as:   Healthy eating habits, meals high in fruits, vegetables, nuts and complex carbohydrates, low in animal fats.  Limit the amount of alcohol ingested   Avoid  Hormone Replacement Therapy.    Exercise  Continue to exercise, aerobic 30 - 40 minutes , 3 -4 times / week    Follow up  I have provided the patient with the following educational materials:  Other- treatment summary   Bone scan now.   Referral to PT.  Bilateral mammogram due March 2023  Follow up visit with me  in 6 months, sooner if concerns  Follow up with Medical Oncology and Radiation Oncology.  Patient agrees with plan.

## 2020-04-25 ENCOUNTER — Ambulatory Visit (INDEPENDENT_AMBULATORY_CARE_PROVIDER_SITE_OTHER): Payer: Commercial Managed Care - POS | Admitting: Family Medicine

## 2020-04-25 ENCOUNTER — Encounter (INDEPENDENT_AMBULATORY_CARE_PROVIDER_SITE_OTHER): Payer: Self-pay | Admitting: Family Medicine

## 2020-04-25 VITALS — BP 130/80 | HR 80 | Temp 97.2°F | Resp 12 | Ht 69.0 in | Wt 275.0 lb

## 2020-04-25 DIAGNOSIS — I10 Essential (primary) hypertension: Secondary | ICD-10-CM

## 2020-04-25 DIAGNOSIS — M62838 Other muscle spasm: Secondary | ICD-10-CM

## 2020-04-25 DIAGNOSIS — T451X5A Adverse effect of antineoplastic and immunosuppressive drugs, initial encounter: Secondary | ICD-10-CM

## 2020-04-25 DIAGNOSIS — F325 Major depressive disorder, single episode, in full remission: Secondary | ICD-10-CM

## 2020-04-25 DIAGNOSIS — E559 Vitamin D deficiency, unspecified: Secondary | ICD-10-CM

## 2020-04-25 DIAGNOSIS — Z171 Estrogen receptor negative status [ER-]: Secondary | ICD-10-CM

## 2020-04-25 DIAGNOSIS — C50412 Malignant neoplasm of upper-outer quadrant of left female breast: Secondary | ICD-10-CM

## 2020-04-25 DIAGNOSIS — G620 Drug-induced polyneuropathy: Secondary | ICD-10-CM

## 2020-04-25 MED ORDER — TIZANIDINE HCL 4 MG PO TABS
ORAL_TABLET | ORAL | 0 refills | Status: DC
Start: 2020-04-25 — End: 2020-04-30

## 2020-04-25 NOTE — Progress Notes (Signed)
Subjective:      Patient ID: Leslie [name] is a 50 y.o. female.    Chief Complaint:  Chief Complaint   Patient presents with    Hypertension       HPI:  [age] yo F presents for a follow up HTN  She is taking lisinopril/hctz as prescribed  She states BP is stable    She will get COVID vaccine once breast cancer treatments are complete     2. Obesity: lost 25 lbs with treatment, cancer    3. Breast cancer treatment going on and feel better    4. 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, estrogen receptor negative    Encounter for antineoplastic chemotherapy    High risk for chemotherapy-induced infectious complication    Acute deep vein thrombosis (DVT) of right upper extremity, unspecified vein    Dehydration    Nutritional anemia    Gastroesophageal reflux disease with esophagitis without hemorrhage    Invasive ductal carcinoma of left breast    Chemotherapy-induced neuropathy    Family history of ischemic heart disease and other diseases of the circulatory system    Major depressive disorder with single episode, in full remission       Current Medications:  Current Outpatient Medications   Medication Sig Dispense Refill    acetaminophen (TYLENOL) 650 MG CR tablet Take 650 mg by mouth every 8 (eight) hours as needed for Pain      ibuprofen (ADVIL) 200 MG tablet Take 400 mg by mouth daily      losartan-hydrochlorothiazide (HYZAAR) 100-25 MG per tablet Take 1 tablet by mouth daily 30 tablet 0    Omeprazole (PRILOSEC PO) Take by mouth every morning      tiZANidine (Zanaflex) 4 MG tablet One tab every 8 hrs as needed sparingly to relax muscles 30 tablet 0    vitamin B-12 (CYANOCOBALAMIN) 1000 MCG tablet Take 1,000 mcg  by mouth daily       No current facility-administered medications for this visit.       Allergies:  Allergies   Allergen Reactions    Gabapentin Facial Swelling     Eye swelling    Valsartan Other (See Comments)     achiness    Penicillins Hives     Symptoms occurred as child  Onset date: 07-28-2004       Past Medical History:  Past Medical History:   Diagnosis Date    Anemia     iron deficient, recent chemo//takes po iron supplement    Clot     mediport site r chest, on elliquis    Fibroid     Gastroesophageal reflux disease     indigestion while on chemo    Herpes simplex without mention of complication 2003    possible recurrance in 2013//no issuses since    Hypertension     well controlled w/ medication    Lymphedema of extremity 11/03/2017    L foot    Malignant neoplasm of breast 02/2019    L breast ca, s/p chemo 08/2019, no radiation    Neurologic disorder     brachial neuritis  Neuropathy     recednt chemo/hands/feet    Obesity     Vitamin D deficiency        Past Surgical History:  Past Surgical History:   Procedure Laterality Date    BIOPSY, SENTINEL NODE Left 10/14/2019    Procedure: BIOPSY, SENTINEL NODE;  Surgeon: Boykin Peek, MD;  Location: [facility];  Service: General;  Laterality: Left;    BREAST, LUMPECTOMY Left 10/14/2019    Procedure: BREAST, LUMPECTOMY, WITH MAG SEED LOCALIZATION;  Surgeon: Boykin Peek, MD;  Location: [facility];  Service: General;  Laterality: Left;    HYSTERECTOMY  2008    fibroids, readmitted for pelvic abscess formation POD#3    MAMMOPLASTY, REDUCTION (MEDICAL) Right 10/14/2019    Procedure: MAMMOPLASTY, REDUCTION (MEDICAL);  Surgeon: Felipe Drone, MD;  Location: [name];  Service: Plastics;  Laterality: Right;    MAMMOPLASTY/ONCOPLASTY Left 10/14/2019    Procedure: MAMMOPLASTY/ONCOPLASTY;  Surgeon: Felipe Drone, MD;  Location: [facility];  Service: Plastics;  Laterality: Left;  Right Wt: 1318g  Left Wt:  1319g    MEDIPORT CHECK Right 06/08/2019    Procedure: MEDIPORT CHECK;  Surgeon: Rex Kras, MD;  [facility];  Service: Interventional Radiology;  Laterality: Right;    MEDIPORT PLACEMENT N/A 04/08/2019    Procedure: MEDIPORT PLACEMENT;  Surgeon: Pollyann Kennedy, MD;  [facility];  Service: Interventional Radiology;  Laterality: N/A;    OTHER SURGICAL HISTORY  05/04/2015    Obalon balloon - have 3 balloons placed in stomach for weight loss, Dr. Georgann Housekeeper    REMOVAL, MEDIPORT Right 10/14/2019    Procedure: REMOVAL, MEDIPORT;  Surgeon: Boykin Peek, MD;  Location:  MAIN OR;  Service: General;  Laterality: Right;    SALPINGOOPHORECTOMY Right 2008    RSO with hysterectomy    TUBAL LIGATION  02/17/1997       Family History:  Family History   Problem Relation Age of Onset    Hypertension Mother     Coronary artery disease Mother     Heart disease Mother     Hyperlipidemia Mother     Hypertension Father     Diabetes Father     Other Father         cardiac catheterization    Hypertension Sister     Breast cancer Maternal Aunt     Uterine cancer Maternal Aunt     Vaginal cancer Maternal Aunt     Breast cancer Other         maternal cousin    Ovarian cancer Neg Hx        Social History:  Social History     Socioeconomic History    Marital status: Legally Separated   Tobacco Use    Smoking status: Never Smoker    Smokeless tobacco: Never Used   Haematologist Use: Never used   Substance and Sexual Activity    Alcohol use: Not Currently    Drug use: Not Currently     Comment: CBD oil/last use month ago    Sexual activity: Yes     Partners: Male     Birth control/protection: Surgical   Social History Narrative    Has elliptical machine        Lives with boyfriend, daughter home        Son died on Jan 28, 2019 in CCU  Mom staying with her, good support system 05/30/2019        Moved to [facility] 04/2020     Social Determinants of Health     Physical Activity:  Insufficiently Active    Days of Exercise per Week: 7 days    Minutes of Exercise per Session: 20 min        The following sections were reviewed this encounter by the provider:   Tobacco   Allergies   Meds   Problems   Med Hx   Surg Hx   Fam Hx          ROS:  Review of Systems  No sob, no cp, no swelling   Vitals:  BP 130/80 (BP Site: Left arm, Patient Position: Sitting, Cuff Size: Large)    Pulse 80    Temp 97.2 F (36.2 C) (Temporal)    Resp 12    Ht 1.753 m (5\' 9" )    Wt 124.7 kg (275 lb)    BMI 40.61 kg/m      Objective:     Physical Exam:  Physical Exam  Constitutional:       General: She is not in acute distress.  HENT:      Head: Atraumatic.   Cardiovascular:      Rate and Rhythm: Regular rhythm.      Heart sounds: No murmur heard.      Pulmonary:      Effort: Pulmonary effort is normal.      Breath sounds: Normal breath sounds. No wheezing.   Musculoskeletal:         General: No swelling or tenderness.      Cervical back: Neck supple.   Neurological:      General: No focal deficit present.   Psychiatric:         Behavior: Behavior normal.          Assessment:     1. Muscle spasm  - tiZANidine (Zanaflex) 4 MG tablet; One tab every 8 hrs as needed sparingly to relax muscles  Dispense: 30 tablet; Refill: 0    2. Major depressive disorder with single episode, in full remission    3. Chemotherapy-induced neuropathy    4. Morbid obesity  - TSH; Future  - Hemoglobin A1C; Future    5. Malignant neoplasm of upper-outer quadrant of left breast in female, estrogen receptor negative    6. Vitamin D deficiency  - Vitamin D,25 OH, Total; Future    7. Essential hypertension  - Comprehensive metabolic panel; Future  - Lipid panel; Future      Plan:     Continue current medicines  Side effect of meds discussed  Do healthy , high fiber, low cholesterol, low carb diet  Follow allowed food list  Exercise most days  Plan for wellness visit every year will do with new PCP  Follow up visit with [facility] in 3-6 months with new PCP  Med after labs  Satira Mccallum, MD

## 2020-04-26 ENCOUNTER — Encounter (INDEPENDENT_AMBULATORY_CARE_PROVIDER_SITE_OTHER): Payer: Self-pay | Admitting: Cardiology

## 2020-04-26 ENCOUNTER — Ambulatory Visit: Payer: Self-pay | Admitting: Radiation Oncology

## 2020-04-26 ENCOUNTER — Encounter: Payer: Self-pay | Admitting: Radiation Oncology

## 2020-04-26 ENCOUNTER — Ambulatory Visit (INDEPENDENT_AMBULATORY_CARE_PROVIDER_SITE_OTHER): Payer: Commercial Managed Care - POS | Admitting: Cardiology

## 2020-04-26 ENCOUNTER — Encounter (INDEPENDENT_AMBULATORY_CARE_PROVIDER_SITE_OTHER): Payer: Self-pay | Admitting: Family Medicine

## 2020-04-26 VITALS — BP 110/74 | HR 70 | Ht 69.0 in | Wt 271.0 lb

## 2020-04-26 DIAGNOSIS — C50412 Malignant neoplasm of upper-outer quadrant of left female breast: Secondary | ICD-10-CM

## 2020-04-26 DIAGNOSIS — I10 Essential (primary) hypertension: Secondary | ICD-10-CM

## 2020-04-26 DIAGNOSIS — Z171 Estrogen receptor negative status [ER-]: Secondary | ICD-10-CM

## 2020-04-26 DIAGNOSIS — M62838 Other muscle spasm: Secondary | ICD-10-CM

## 2020-04-26 NOTE — Progress Notes (Signed)
RADIATION ONCOLOGY CLINIC NOTE    Requesting Physician: Fuller Mandril, MD    Consulting Physicians:  1) [name] MD    PCP: Satira Mccallum, MD    IDENTIFYING HISTORY: Leslie [name] is a 50 y.o.  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  She is status post neoadjuvant chemotherapy followed by partial mastectomy/sentinel lymph node biopsy (pCR) and a course of adjuvant radiotherapy.  She presents today in routine clinical follow-up.    ONCOLOGIC HISTORY:  1.  The patient initially presented for her first mammogram.  Per the patient report, this demonstrated a mass in the left breast.  An ultrasound demonstrated a 3.1 x 2 cm hypoechoic mass, and a mildly enlarged left axillary lymph node.  2.  On March 07, 2019, the patient underwent an ultrasound-guided core needle biopsy of the left breast mass.  This revealed an invasive ductal carcinoma, grade 3, triple negative, Ki-67 80%.  In addition a core needle biopsy of the left axillary lymph node was negative.  3.  An MRI of the breast was performed on March 25, 2019.  The mass measured 4 x 1.9 x 1.9 cm without other significant findings.  4.  The patient was seen in consultation by Dr. Corie Chiquito and [name], who recommended neoadjuvant chemotherapy.  5.  The patient subsequently completed neoadjuvant chemotherapy under the care of Dr. Ladell Heads.  This was tolerated with significant toxicities, including severe neuropathy and fatigue.  6.  On September 12, 2019, the patient underwent a restaging breast MRI, showing resolution of disease.  7.  On October 14, 2019, the patient was taken to the operating room by Dr. Henderson Baltimore and Devona Konig for a left partial mastectomy and sentinel lymph node biopsy with oncoplastic reconstruction and a right reduction mammoplasty.  Pathology showed a pathological complete response, with a scarred tumor bed in the  breast.  2 sentinel lymph nodes were negative, without evidence of treatment effect.  8.  I initially met the patient in consultation on November 09, 2019.  Given the initial bulk of disease prior to chemotherapy, and her triple negative phenotype, I recommended a course of adjuvant radiotherapy to include the left breast, and regional lymphatics.  After an extensive discussion, the patient elected to move forward.  I recommended physical therapy before representing for CT simulation.  9.  In the interim, the patient engaged with physical therapy, with significant improvements in the range of motion of her bilateral shoulders.  She indicated that she would like to move forward with radiotherapy.  10.  Therefore, from December 05, 2019 through January 09, 2020, the patient received a course of comprehensive adjuvant radiotherapy, with 50 Gray delivered over 25 fractions, using a 3D plan.  This included treatment of the left breast, supraclavicular fossa and axilla given the lack of axillary dissection.  Given the lateral primary, T2, negative lymph nodes, and pCR, the internal mammary chain was not covered due to the extremely low risk of involvement in order to reduce both lung and heart dose.  She tolerated treatment well, with the expected acute toxicities.  11.  On April 25, 2020, the patient underwent a diagnostic bilateral mammogram with CAD.  There are no suspicious findings in either breast.  Postsurgical changes noted from left segmental mastectomy and axillary lymph node sampling.  Diffuse left breast thickening from radiotherapy.    INTERVAL HISTORY:   Leslie [name]  returns today to again discuss definitive management of their cancer. Since our last visit, they have continued to do well, with no intercurrent health issues.  Overall, her recovery has been quite smooth.  Her skin is most back to normal, and her left breast edema is quite mild.  She does continue to moisturize daily with Shea butter,  sweeping towards the axilla.  No new lumps or bumps.  Fatigue gradually improving.  No cough, shortness of breath or other new health issues.  Moved to [employer] in the interim.  Does continue to have left shoulder pain over the scapula.    PERFORMANCE STATUS:  ECOG: 0  KPS: 100%     04/26/20 1143   Comfort Alteration   Karnofsky Performance Score 100%-normal, no complaints   Fatigue 3   Pain Score 2   Pain Loc SHOULDER   Pain Intervention 1-over-the-counter medications   Effectiveness of Pain Intervention 1-pain relieved 25%   Nutrition Alteration   Weight 124.5 kg (274 lb 8 oz)   Emotional Alteration   Coping 0-effective   Vital signs   Temp 97 F (36.1 C)   Temp Source Temporal   Heart Rate 87   Resp Rate 20   BP 125/82       Current Outpatient Medications:     acetaminophen (TYLENOL) 650 MG CR tablet, Take 650 mg by mouth every 8 (eight) hours as needed for Pain, Disp: , Rfl:     ibuprofen (ADVIL) 200 MG tablet, Take 400 mg by mouth daily, Disp: , Rfl:     losartan-hydrochlorothiazide (HYZAAR) 100-25 MG per tablet, Take 1 tablet by mouth daily, Disp: 30 tablet, Rfl: 0    Omeprazole (PRILOSEC PO), Take by mouth every morning, Disp: , Rfl:     tiZANidine (Zanaflex) 4 MG tablet, One tab every 8 hrs as needed sparingly to relax muscles, Disp: 30 tablet, Rfl: 0    vitamin B-12 (CYANOCOBALAMIN) 1000 MCG tablet, Take 1,000 mcg by mouth daily, Disp: , Rfl:     Allergies   Allergen Reactions    Gabapentin Facial Swelling     Eye swelling    Valsartan Other (See Comments)     achiness    Penicillins Hives     Symptoms occurred as child  Onset date: 07-28-2004       PHYSICAL EXAMINATION:  Vitals: There were no vitals filed for this visit.  Weight Monitoring 01/23/2020 03/19/2020 04/25/2020   Height 175.3 cm - 175.3 cm   Height Method - - -   Weight 122.925 kg 123.832 kg 124.739 kg   Weight Method - - -   BMI (calculated) 40.1 kg/m2 - 40.7 kg/m2       PAIN SCORE: 0/10    General appearance: Comfortable  appearing, no acute distress.  Breast: Performed with female chaperone.  The patient is status post bilateral reduction mammoplasty's.  Breasts are equal and symmetrical.  Very very faint hyperpigmentation of the left breast when compared to right.  On palpation, no palpable masses bilaterally, and no axillary adenopathy.  On the left, there is very slight edema, with slight tenderness.  Back: Over the left scapula in the region of the trapezius, there is exquisite tenderness to palpation in the muscle.    The remainder of the exam was deferred for discussion of treatment and care coordination.     IMAGING STUDIES: The following films and/or reports have been personally reviewed: No new imaging to review.    ASSESSMENT:  Leslie [name] is a 50 y.o.  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  She is status post neoadjuvant chemotherapy followed by partial mastectomy/sentinel lymph node biopsy (pCR) and a course of adjuvant radiotherapy.  She presents today in routine clinical follow-up.    Disease control: The patient is currently NED by both mammography and physical examination.  She will continue to follow with Dr. Ladell Heads and Dr. Roselie Awkward for her long-term cancer surveillance.  Follow-up with radiation oncology on a as needed basis.    Side effects: Excellent recovery from radiotherapy.  I do recommend that she continue to moisturize once daily, sweeping any edema towards the left axilla.  She should continue doing this for at least 1 year post radiotherapy.    Pain action plan: Her left upper back pain is musculoskeletal in nature, most likely due to accommodations related to her postsurgical recovery.  I recommend she engage with a skilled massage therapist, and consider Tylenol/Advil as needed.    PLAN:  1.  The patient will now follow-up with radiation oncology on a as needed basis.  She will otherwise continue to  follow with her other providers as scheduled.  She knows to call me any questions or concerns.    My total visit time for this patient encounter was 35 min. This includes time spent preparing to see the patient, performing the exam, counselling patient/family, ordering and reviewing tests, medications and/or procedures, care coordination, and documenting clinical information in the record.       Darrol Poke. Sibyl Parr, MD, MPharmacol.  Attending Radiation Oncologist   [facility]  Department of Advanced Radiation Oncology and Proton Therapy  [address]  [place], [place] [address]  T [phone]  F [phone]  [employer]In   [facility]

## 2020-04-26 NOTE — Progress Notes (Unsigned)
04/26/20 1143   Comfort Alteration   Karnofsky Performance Score 100%-normal, no complaints   Fatigue 3   Pain Score 2   Pain Loc SHOULDER   Pain Intervention 1-over-the-counter medications   Effectiveness of Pain Intervention 1-pain relieved 25%   Nutrition Alteration   Weight 124.5 kg (274 lb 8 oz)   Emotional Alteration   Coping 0-effective   Vital signs   Temp 97 F (36.1 C)   Temp Source Temporal   Heart Rate 87   Resp Rate 20   BP 125/82

## 2020-04-26 NOTE — Progress Notes (Signed)
[facility] OFFICE PROGRESS NOTE    [facility] OFFICE -CARDIOLOGY  [address]  [place] [address]  Dept: [phone]  Dept Fax: [phone]       Patient Name: Leslie [name]    Date of Visit:  April 26, 2020  Date of Birth: 1970-09-25  AGE: 50 y.o.  Medical Record #: [id number]  Requesting Physician: Satira Mccallum, MD         HISTORY OF PRESENT ILLNESS:    Shriya feels fine.  Her recent echo revealed normal ejection fraction with a GLS of -17.9.  Previously had been -19  She denies any chest pains      PAST MEDICAL HISTORY: She has a past medical history of Anemia, Clot, Fibroid, Gastroesophageal reflux disease, Herpes simplex without mention of complication (2003), Hypertension, Lymphedema of extremity (11/03/2017), Malignant neoplasm of breast (02/2019), Neurologic disorder, Neuropathy, Obesity, and Vitamin D deficiency. She has a past surgical history that includes Salpingoophorectomy (Right, 2008); OTHER SURGICAL HISTORY (05/04/2015); Tubal ligation (02/17/1997); Hysterectomy (2008); Mediport Placement (N/A, 04/08/2019); Mediport Check (Right, 06/08/2019); BIOPSY, SENTINEL NODE (Left, 10/14/2019); REMOVAL, MEDIPORT (Right, 10/14/2019); BREAST, LUMPECTOMY (Left, 10/14/2019); MAMMOPLASTY/ONCOPLASTY (Left, 10/14/2019); and MAMMOPLASTY, REDUCTION (MEDICAL) (Right, 10/14/2019).    ALLERGIES:   Allergies   Allergen Reactions    Gabapentin Facial Swelling     Eye swelling    Valsartan Other (See Comments)     achiness    Penicillins Hives     Symptoms occurred as child  Onset date: 07-28-2004       MEDICATIONS:   Current Outpatient Medications   Medication Sig    acetaminophen (TYLENOL) 650 MG CR tablet Take 650 mg by mouth every 8 (eight) hours as needed for Pain    ibuprofen (ADVIL) 200 MG tablet Take 400 mg by mouth daily    losartan-hydrochlorothiazide (HYZAAR) 100-25 MG per tablet Take 1 tablet by mouth daily    Omeprazole (PRILOSEC PO) Take by  mouth every morning    tiZANidine (Zanaflex) 4 MG tablet One tab every 8 hrs as needed sparingly to relax muscles        FAMILY HISTORY: family history includes Breast cancer in her maternal aunt and another family member; Coronary artery disease in her mother; Diabetes in her father; Heart disease in her mother; Hyperlipidemia in her mother; Hypertension in her father, mother, and sister; Other in her father; Uterine cancer in her maternal aunt; Vaginal cancer in her maternal aunt.    SOCIAL HISTORY: She reports that she has never smoked. She has never used smokeless tobacco. She reports previous alcohol use. She reports previous drug use.    PHYSICAL EXAMINATION    Visit Vitals  BP 110/74   Pulse 70   Ht 1.753 m (5\' 9" )   Wt 122.9 kg (271 lb)   BMI 40.02 kg/m       General Appearance:  A well-appearing female in no acute distress.    Skin: Warm and dry to touch  Head: Normocephalic, normal hair pattern  Eyes:   No pallor or cyanosis.   Neck: JVP normal, no carotid bruit   Chest: Clear to auscultation bilaterally with good air movement and respiratory effort and no wheezes, rales, or rhonchi   Cardiovascular: Regular rhythm, S1 normal, S2 normal, No S3 or S4, Apical impulse not displaced, no murmurs, gallops or rubs detected    Extremities: Warm without edema, clubbing, or cyanosis. Radial Pulses 3+ bilaterally  Neuro: Alert  and oriented x3. No gross motor or sensory deficits noted, affect appropriate.            LABS:   No results found for: CBC  Lab Results   Component Value Date    AST 30 09/15/2019    ALT 30 09/15/2019     No results found for: LIPID  Lab Results   Component Value Date    HGBA1C 5.5 02/26/2018    BNP <10 03/29/2019    TSH 0.908 02/26/2018             IMPRESSION:    1. Mild worsening of GLS-off chemo  2. History of breast cancer      RECOMMENDATIONS:    1.  We will contact cardio oncology to see if anything further needs to be done.                                                     No orders  of the defined types were placed in this encounter.      No orders of the defined types were placed in this encounter.      SIGNED:    Joni Reining, MD          This note was generated by the Dragon speech recognition and may contain errors or omissions not intended by the user. Grammatical errors, random word insertions, deletions, pronoun errors, and incomplete sentences are occasional consequences of this technology due to software limitations. Not all errors are caught or corrected. If there are questions or concerns about the content of this note or information contained within the body of this dictation, they should be addressed directly with the author for clarification.

## 2020-04-27 ENCOUNTER — Other Ambulatory Visit (FREE_STANDING_LABORATORY_FACILITY): Payer: Commercial Managed Care - POS

## 2020-04-27 ENCOUNTER — Encounter (HOSPITAL_BASED_OUTPATIENT_CLINIC_OR_DEPARTMENT_OTHER): Payer: Self-pay | Admitting: Family Nurse Practitioner

## 2020-04-27 ENCOUNTER — Ambulatory Visit (INDEPENDENT_AMBULATORY_CARE_PROVIDER_SITE_OTHER): Payer: Commercial Managed Care - POS | Admitting: Family Nurse Practitioner

## 2020-04-27 VITALS — BP 108/76 | HR 89 | Temp 97.3°F | Resp 16 | Ht 69.0 in | Wt 273.0 lb

## 2020-04-27 DIAGNOSIS — I10 Essential (primary) hypertension: Secondary | ICD-10-CM

## 2020-04-27 DIAGNOSIS — E559 Vitamin D deficiency, unspecified: Secondary | ICD-10-CM

## 2020-04-27 DIAGNOSIS — Z853 Personal history of malignant neoplasm of breast: Secondary | ICD-10-CM

## 2020-04-27 DIAGNOSIS — M25512 Pain in left shoulder: Secondary | ICD-10-CM

## 2020-04-27 DIAGNOSIS — M79605 Pain in left leg: Secondary | ICD-10-CM

## 2020-04-27 LAB — HEMOGLOBIN A1C
Average Estimated Glucose: 91.1 mg/dL
Hemoglobin A1C: 4.8 % (ref 4.6–5.9)

## 2020-04-27 LAB — LIPID PANEL
Cholesterol / HDL Ratio: 3.2
Cholesterol: 189 mg/dL (ref 0–199)
HDL: 60 mg/dL (ref 40–9999)
LDL Calculated: 118 mg/dL — ABNORMAL HIGH (ref 0–99)
Triglycerides: 54 mg/dL (ref 34–149)
VLDL Calculated: 11 mg/dL (ref 10–40)

## 2020-04-27 LAB — COMPREHENSIVE METABOLIC PANEL
ALT: 14 U/L (ref 0–55)
AST (SGOT): 18 U/L (ref 5–34)
Albumin/Globulin Ratio: 1.1 (ref 0.9–2.2)
Albumin: 3.9 g/dL (ref 3.5–5.0)
Alkaline Phosphatase: 95 U/L (ref 37–117)
Anion Gap: 9 (ref 5.0–15.0)
BUN: 16 mg/dL (ref 7.0–19.0)
Bilirubin, Total: 0.5 mg/dL (ref 0.2–1.2)
CO2: 29 mEq/L (ref 21–29)
Calcium: 9.6 mg/dL (ref 8.5–10.5)
Chloride: 100 mEq/L (ref 100–111)
Creatinine: 1.1 mg/dL (ref 0.4–1.5)
Globulin: 3.6 g/dL (ref 2.0–3.7)
Glucose: 95 mg/dL (ref 70–100)
Potassium: 4 mEq/L (ref 3.5–5.1)
Protein, Total: 7.5 g/dL (ref 6.0–8.3)
Sodium: 138 mEq/L (ref 136–145)

## 2020-04-27 LAB — VITAMIN D,25 OH,TOTAL: Vitamin D, 25 OH, Total: 14 ng/mL — ABNORMAL LOW (ref 30–100)

## 2020-04-27 LAB — HEMOLYSIS INDEX: Hemolysis Index: 2 (ref 0–24)

## 2020-04-27 LAB — GFR: EGFR: >60

## 2020-04-27 LAB — TSH: TSH: 0.84 u[IU]/mL (ref 0.35–4.94)

## 2020-04-30 MED ORDER — LOSARTAN POTASSIUM-HCTZ 100-25 MG PO TABS
1.0000 | ORAL_TABLET | Freq: Every day | ORAL | 0 refills | Status: DC
Start: 2020-04-30 — End: 2020-09-03

## 2020-04-30 MED ORDER — TIZANIDINE HCL 4 MG PO TABS
ORAL_TABLET | ORAL | 0 refills | Status: AC
Start: 2020-04-30 — End: ?

## 2020-04-30 NOTE — Progress Notes (Signed)
Sent to pharmacy and patient notified.  ?

## 2020-04-30 NOTE — Progress Notes (Signed)
Hello Ms.  Menser:    I have reviewed your results.    Vitamin d low, take vitamin d3 5000iu daily and need recheck in one year ok  Normal liver, kidneys, sugars, thyroid...good news!    Cholesterol overall good since your good HDL is great at 60!  Keep  bad LDL  down under 100 by low cholesterol diet, exercise, further weight loss      Take good care and practice healthy habits  Dr. Jonathon Bellows

## 2020-05-02 ENCOUNTER — Encounter (HOSPITAL_BASED_OUTPATIENT_CLINIC_OR_DEPARTMENT_OTHER): Payer: Self-pay | Admitting: Family Nurse Practitioner

## 2020-05-02 ENCOUNTER — Encounter: Payer: Self-pay | Admitting: Internal Medicine

## 2020-05-08 ENCOUNTER — Encounter: Payer: Self-pay | Admitting: Internal Medicine

## 2020-05-08 ENCOUNTER — Other Ambulatory Visit: Payer: Self-pay | Admitting: Family

## 2020-05-08 ENCOUNTER — Other Ambulatory Visit (HOSPITAL_COMMUNITY): Payer: Self-pay | Admitting: Family

## 2020-05-08 DIAGNOSIS — M25512 Pain in left shoulder: Secondary | ICD-10-CM

## 2020-05-08 DIAGNOSIS — M79605 Pain in left leg: Secondary | ICD-10-CM

## 2020-05-08 DIAGNOSIS — Z853 Personal history of malignant neoplasm of breast: Secondary | ICD-10-CM

## 2020-05-18 ENCOUNTER — Encounter (HOSPITAL_BASED_OUTPATIENT_CLINIC_OR_DEPARTMENT_OTHER): Payer: Self-pay | Admitting: Family Nurse Practitioner

## 2020-05-21 ENCOUNTER — Other Ambulatory Visit: Payer: Self-pay

## 2020-05-21 ENCOUNTER — Encounter (HOSPITAL_COMMUNITY)
Admission: RE | Admit: 2020-05-21 | Discharge: 2020-05-21 | Disposition: A | Discharge status: Home / Self Care | Payer: Managed Care, Other (non HMO) | Source: Ambulatory Visit | Attending: Family | Admitting: Family

## 2020-05-21 DIAGNOSIS — M25512 Pain in left shoulder: Secondary | ICD-10-CM | POA: Diagnosis present

## 2020-05-21 DIAGNOSIS — M79605 Pain in left leg: Secondary | ICD-10-CM | POA: Diagnosis present

## 2020-05-21 DIAGNOSIS — Z853 Personal history of malignant neoplasm of breast: Secondary | ICD-10-CM | POA: Diagnosis present

## 2020-05-21 MED ORDER — TECHNETIUM TC 99M MEDRONATE IV KIT
20.0000 | PACK | Freq: Once | INTRAVENOUS | Status: AC | PRN
  Administered 2020-05-21: 21.2 via INTRAVENOUS

## 2020-05-22 ENCOUNTER — Other Ambulatory Visit (HOSPITAL_BASED_OUTPATIENT_CLINIC_OR_DEPARTMENT_OTHER): Payer: Self-pay | Admitting: Family Nurse Practitioner

## 2020-05-22 ENCOUNTER — Encounter: Payer: Self-pay | Admitting: Internal Medicine

## 2020-05-22 ENCOUNTER — Other Ambulatory Visit (HOSPITAL_COMMUNITY): Payer: Self-pay | Admitting: Family

## 2020-05-22 DIAGNOSIS — R948 Abnormal results of function studies of other organs and systems: Secondary | ICD-10-CM

## 2020-05-22 DIAGNOSIS — M899 Disorder of bone, unspecified: Secondary | ICD-10-CM

## 2020-05-22 NOTE — Progress Notes (Signed)
Reviewed bone scan findings. Nonspecific tracer uptake at anterior right fifth rib- trauma vs metastatic disease, recommend radiographic coreelation. No additional sites of concerning osseous tracer uptake identified.     Ordered chest x-ray to evaluate area of concern. Follow up pending results. Pt verbalized understanding and agrees with plan.

## 2020-05-23 NOTE — Progress Notes (Signed)
[facility]   [address] [place], [place] [address]     TELEMEDICINE VISIT       LYNZY, RAWLES    Date of Visit:  05/25/2019  Date of Birth: April 27, 1970  Age: 50 yrs.   Medical Record Number: [id number]  __  CURRENT DIAGNOSES     1. Malignant neoplasm of unspecified  site of left female breast, C50.912  2. Essential (primary) hypertension, I10  3. 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 Family history of ischemic heart disease and other diseases of the circulatory system, Z82.49  __   ALLERGIES    Penicillins, Rash  __  MEDICATIONS      1. losartan 100 mg-hydrochlorothiazide 25 mg tablet, 1 po qd  2. Vitamin D2 1,250 mcg (50,000 unit) capsule, 1 po qd  __  CHIEF COMPLAINT/REASON FOR VISIT   Followup of Essential (primary) hypertension, Followup of Family history of ischemic heart disease and other diseases of the circulatory system and Followup of Malignant neoplasm of unspecified site of left female breast  __   HISTORY OF PRESENT ILLNESS  The visit today was conducted via telemedicine due to COVID-19 precautions. The patient was in their home and was informed and gave verbal consent to proceed.    50 year old  female with past medical history of hypertension, family history of coronary artery disease and triple negative breast cancer coming in for follow-up. Patient with planned DD AC x4 followed by paclitaxel and carboplatin. Patient has completed 3 cycles,  initial echocardiogram with strain showed EF of 65% and GLS of -19%. After her first 2 cycles ejection fraction remained 65% as did the GLS at -19%. Patient has not had any lower extremity swelling, chest pain or shortness of breath on exertion. Patient  has had generalized fatigue, nausea and vomiting. Patient is about to begin her fourth cycle this week. Patient's blood pressure throughout chemo has been controlled on her losartan and hydrochlorothiazide. Patient denies fever, night sweats and chills.       __  PAST HISTORY      Past Medical Illnesses:  Breast cancer, Lymphedema of extremity, brachial neuritis, Obesity;  Past Cardiac Illnesses: Heart Murmur,  HTN; Infectious Diseases: Chicken Pox, Herpes simplex; Surgical Procedures : Hysterectomy, Obalon balloon, Salpingoophorectomy, Tubal ligation; Trauma History: No previous history of significant trauma.;  Cardiology Procedures-Invasive: No previous interventional or invasive cardiology procedures.; Cardiology Procedures-Noninvasive : Echocardiogram 05/04/2019; Left Ventricular Ejection Fraction: LVEF of 64% documented via echocardiogram on 05/04/2019   ___  FAMILY HISTORY  Mother -- Heart disease    __  CARDIAC RISK FACTORS      Tobacco Abuse: has never used tobacco; Family History of Heart Disease: positive;  Hyperlipidemia: negative; Hypertension: positive;   Diabetes Mellitus: negative; Prior History of Heart Disease: negative;  Obesity: negative; Sedentary Life Style:negative;  [id number]  __  SOCIAL HISTORY     Alcohol Use: socially and 2-4 times monthly; Smoking: Does not smoke; Never smoker ([id number]);  Diet: Regular diet and Caffeine use-1-2 per day; Exercise: walking twice a week;   __   REVIEW OF SYSTEMS    General: Denies recent weight loss, weight gain, fever or chills or change  in exercise tolerance.; Integumentary: Denies any change in hair or nails, rashes, or skin lesions.;  Eyes: wears eye glasses/contact lenses; Ears, Nose, Throat, Mouth: Denies any hearing loss, epistaxis,  hoarseness or difficulty speaking.;Respiratory: Denies dyspnea, cough, wheezing or hemoptysis.; Cardiovascular : Please review HPI; Abdominal : Denies ulcer  disease, hematochezia or melena.;Musculoskeletal :Denies any venous insufficiency, arthritic symptoms or back problems.; Neurological : Denies any recurrent strokes, TIA, or seizure disorder.;  Psychiatric: Denies any depression, substance abuse or change in cognitive functions.; Endocrine: Denies  any weight change, heat/cold  intolerance, polydipsia, or polyuria; Hematologic/Immunologic: Denies any food allergies, seasonal allergies, bleeding disorders.   __  PHYSICAL EXAMINATION    Vital Signs:  Blood Pressure:       Weight: 303.00 lbs.  Height: 69.00"  BMI:  44.74   Pulse: 67/min.       Constitutional:  Well developed, well nourished, in no acute distress Skin: no apparent skin lesions Head : normocephalic Eyes: EOMS intact Extremities/Back : no edema present Psychiatric: normal memory Neurological : oriented to time, person and place   __    Medications added today by the physician:    ECG:  []      IMPRESSIONS:    50 year old female with past medical history of hypertension, family history of coronary artery  disease and triple negative breast cancer coming in for follow-up. Patient with planned DD AC x4 followed by paclitaxel and carboplatin. Patient has completed 3 cycles, initial echocardiogram with strain showed EF of 65% and GLS of -19%. After her first  2 cycles ejection fraction remained 65% as did the GLS at -19%. Patient with no new symptoms, that are reflective of cardiovascular disease. Neck strain echo is planned to be in 3 weeks after she finishes her fourth cycle of DD AC. We will have another  follow-up strain echo 3 months after that which will be 4 months from now. Patient will relay to [place] if she experiences new symptoms. Otherwise, we will see her in 4 to 6 months after she has completed her post treatment 25-month echo.      RECOMMENDATIONS:     1. Triple negative breast cancer  -Has completed 3 rounds of DD AC of 4  -Strain echo done after round to shows GLS of -19% an EF of 65% which is the same as baseline  -We will repeat strain echo after fourth round of DD AC followed by another  1 3 months after that which would be 4 months from now.  -Discussed again signs and symptoms of heart failure and to call the office if she experiences these.  -We will follow up after these 2 echocardiograms are completed in 4 to 6  months     2. Hypertension  -At goal during her visits  -Continue losartan and hydrochlorothiazide    3. Family history of coronary artery disease  -Mother with CABG in her early 42s  -We will keep a close eye on her symptoms.       This note was generated by the [facility] EMR system/Dragon speech recognition and may contain errors or omissions not intended by the user. Grammatical errors, random word insertions, deletions, pronoun errors, and incomplete sentences are occasional consequences  of this technology due to software limitations. Not all errors are caught or corrected. If there are questions or concerns about the content of this note or information contained within the body of this dictation, they should be addressed directly with  the author for clarification.    Andree Coss, DO      cc: Satira Mccallum MD    ____________________________  TODAYS ORDERS  Echo, Limited Global Strain 45 MIN 3 weeks  Echo, Limited Global Strain 45 MIN 4 months   Cardio-Oncology Follow Up 30 Min 4 months

## 2020-05-23 NOTE — Progress Notes (Signed)
[facility]   [address] [place], [place] [address]     TELEMEDICINE VISIT       KINGSLEY, HERANDEZ    Date of Visit:  04/14/2019  Date of Birth: November 04, 1970  Age: 50 yrs.   Medical Record Number: [id number]  Referring Physician: PENNISI MD, Marylene Land  __   CURRENT DIAGNOSES     1. Malignant neoplasm of unspecified site of left female breast, C50.912  2. Essential (primary) hypertension, I10  3. 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 Family history of ischemic heart disease and other diseases of the circulatory system, Z82.49  __  ALLERGIES     Penicillins, Rash  __  MEDICATIONS     1. losartan 100 mg-hydrochlorothiazide 25 mg tablet,  1 po qd  2. Vitamin D2 1,250 mcg (50,000 unit) capsule, 1 po qd  __  CHIEF COMPLAINT/REASON FOR VISIT  cardio-onc visit  __   HISTORY OF PRESENT ILLNESS  The visit today was conducted via telemedicine due to COVID-19 precautions. The patient was in their home and was informed and gave verbal consent to proceed.    50 year old  female with past medical history of hypertension, and early family history of coronary artery disease having a telehealth consultation for cardio oncology visit for breast cancer. 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 Patient completed her initial strain echocardiogram, normal global longitudinal strain for her age. Patient with past medical history of hypertension, for which she takes losartan. Patient has no history of  increased cholesterol. Patient was a never smoker. Patient's mother had coronary artery disease and CABG in her early 75s. Patient has never had cardiac issues herself. Patient is overweight. Patient is going to her first round of chemotherapy today,  will be receiving DD AC x4, followed by paclitaxel and carboplatin. Patient denies chest pain at rest and with exertion. Patient with no shortness of breath. Patient had mild lymphedema  in the past, is aware of what baseline feels like to her. Patient  denies fever, night sweats and chills.    __  PAST HISTORY     Past Medical Illnesses : Breast cancer, Lymphedema of extremity, brachial neuritis, Obesity;  Past Cardiac Illnesses: Heart  Murmur, HTN; Infectious Diseases: Chicken Pox, Herpes simplex; Surgical Procedures : Hysterectomy, Obalon balloon, Salpingoophorectomy, Tubal ligation; Trauma History: No previous history of significant trauma.;  Cardiology Procedures-Invasive: No previous interventional or invasive cardiology procedures.; Cardiology Procedures-Noninvasive : No previous non-invasive cardiovascular testing.  ___  FAMILY HISTORY  Mother --  Heart disease    __  CARDIAC RISK FACTORS     Tobacco Abuse: has never used tobacco;  Family History of Heart Disease: positive; Hyperlipidemia: negative;  Hypertension: positive;  Diabetes Mellitus: negative;  Prior History of Heart Disease: negative; Obesity: negative;  Sedentary Life Style:negative; [id number]  __   SOCIAL HISTORY    Alcohol Use: socially and 2-4 times monthly;  Smoking: Does not smoke; Never smoker ([id number]); Diet: Regular diet and Caffeine use-1-2 per day;  Exercise: walking twice a week;   __  REVIEW OF SYSTEMS     General: Denies recent weight loss, weight gain, fever or chills or change in exercise tolerance.; Integumentary : Denies any change in hair or nails, rashes, or skin lesions.; Eyes: wears eye glasses/contact lenses;  Ears, Nose, Throat, Mouth: Denies any hearing loss, epistaxis, hoarseness or difficulty speaking.;Respiratory : Denies dyspnea, cough, wheezing or hemoptysis.;  Cardiovascular: Please review HPI; Abdominal  : Denies ulcer disease, hematochezia or melena.;Musculoskeletal:Denies any venous insufficiency, arthritic symptoms or back problems.;  Neurological : Denies any recurrent strokes, TIA, or seizure disorder.; Psychiatric: Denies any depression,  substance abuse or change in cognitive functions.;  Endocrine: Denies any weight change, heat/cold intolerance, polydipsia, or polyuria;  Hematologic/Immunologic: Denies any food allergies, seasonal allergies, bleeding disorders.  __  PHYSICAL EXAMINATION     Vital Signs:  Blood Pressure:      Weight:  300.00 lbs.  Height: 69.00"  BMI: 44.30        Constitutional: Well developed, well nourished, in no acute distress Skin: no apparent skin lesions  Head: normocephalic Eyes: EOMS intact Extremities/Back : no edema present Psychiatric: normal memory Neurological : oriented to time, person and place   __    Medications added today by the physician:        IMPRESSIONS:    50 year old female with past medical history of hypertension and early family history of coronary disease establishing  cardia oncology visit for triple negative breast cancer. Patient's risk or is 6 (4 for anthracyclines, 1 for being female and 1 for hypertension), which puts her at high risk. Patient is receiving DD AC x4, will get repeat echocardiogram after cycle 2,  4 and 3 months after that. Patient's initial echo has normal global longitudinal strain. Patient's blood pressure is controlled on losartan already, which is beneficial to [place]. We will make a follow-up visit in 2 months, after her second echo from the  fourth cycle to check in on symptoms.      RECOMMENDATIONS:    1. Breast cancer  -Going to be on anthracycline therapy as above  -Echo with strain after cycle 2, 4 and 3 months after cycle 4  -We will come back in for a visit  after her fourth cycle to assess symptoms  -Discussed chest pain, shortness of breath and lower extremity edema as cardiac symptoms that would benefit from letting me know.    2. Hypertension  -Continue losartan  -Controlled, goal blood  pressure less than 130/80    3. Family history of coronary disease  -Mother with CABG in her early 73s  -This is a risk factor for cardiovascular genes in the future, will keep in mind.      This note was generated by the [facility]  EMR  system/Dragon speech recognition and may contain errors or omissions not intended by the user. Grammatical errors, random word insertions, deletions, pronoun errors, and incomplete sentences are occasional consequences of this technology due to software  limitations. Not all errors are caught or corrected. If there are questions or concerns about the content of this note or information contained within the body of this dictation, they should be addressed directly with the author for clarification.         Andree Coss, DO      cc: Satira Mccallum MD  Marylene Land PENNISI MD  ____________________________  TODAYS ORDERS  Echo, Limited Global Strain 45 MIN 3 weeks  Echo, Global Strain 60 MIN 6 weeks  Diet_mg[facility]_edu,_guidance_and_counseling  TODAY  Return Visit 15 MIN 2 months

## 2020-06-07 ENCOUNTER — Other Ambulatory Visit: Payer: Self-pay

## 2020-06-07 ENCOUNTER — Ambulatory Visit (HOSPITAL_COMMUNITY)
Admission: RE | Admit: 2020-06-07 | Discharge: 2020-06-07 | Disposition: A | Discharge status: Home / Self Care | Payer: Managed Care, Other (non HMO) | Source: Ambulatory Visit

## 2020-06-07 ENCOUNTER — Encounter (HOSPITAL_COMMUNITY): Payer: Self-pay

## 2020-06-07 VITALS — BP 133/85 | HR 93 | Temp 98.3°F | Resp 17

## 2020-06-07 DIAGNOSIS — J301 Allergic rhinitis due to pollen: Secondary | ICD-10-CM

## 2020-06-07 DIAGNOSIS — J069 Acute upper respiratory infection, unspecified: Secondary | ICD-10-CM | POA: Diagnosis not present

## 2020-06-07 DIAGNOSIS — J029 Acute pharyngitis, unspecified: Secondary | ICD-10-CM

## 2020-06-07 HISTORY — DX: Malignant neoplasm of unspecified site of unspecified female breast: C50.919

## 2020-06-07 HISTORY — DX: Essential (primary) hypertension: I10

## 2020-06-07 MED ORDER — PREDNISONE 20 MG PO TABS: 40.0000 mg | ORAL_TABLET | Freq: Every day | ORAL | 0 refills | Status: AC

## 2020-06-07 MED ORDER — CETIRIZINE HCL 10 MG PO TABS: 10.0000 mg | ORAL_TABLET | Freq: Every day | ORAL | 2 refills | Status: DC

## 2020-06-07 MED ORDER — LIDOCAINE VISCOUS HCL 2 % MT SOLN: 15.0000 mL | OROMUCOSAL | 0 refills | Status: DC | PRN

## 2020-06-07 NOTE — Discharge Instructions (Addendum)
-  Prednisone two pills taken at the same time for 5 days. This will help reduce the inflammation in your throat. I recommend taking this in the morning as it could give you energy.  -For sore throat, use lidocaine mouthwash up to every 4 hours. Make sure not to eat for at least 1 hour after using this, as your mouth will be very numb and you could bite yourself. -Take Tylenol 1000 mg 3 times daily, and ibuprofen 800 mg 3 times daily with food.  You can take these together, or alternate every 3-4 hours. -You can also try Zyrtec for the allergic rhinitis component.  Try this 1 pill daily for about a week, continue for longer if it's helping. -Seek additional medical attention if you develop new symptoms like trouble swallowing, dizziness, chest pain, shortness of breath.

## 2020-06-07 NOTE — ED Triage Notes (Signed)
Pt presents with cough and sore throat xs 4 days. States advil give some relief for pain.

## 2020-06-07 NOTE — ED Provider Notes (Signed)
[facility]    CSN: [id number] Arrival date & time: 06/07/20  1500      History   Chief Complaint Chief Complaint  Patient presents with  . Cough    HPI Olivia [name] is a 50 y.o. female presenting with cough and sore throat x4 days.  Medical history currently active breast cancer and hypertension.  Notes productive cough and sore throat.  Advil provides some relief.  Denies fever/chills, nausea/vomiting/diarrhea, shortness of breath, dizziness, chest pain.  HPI  Past Medical History:  Diagnosis Date  . Breast cancer ([facility])   . Hypertension     There are no problems to display for this patient.   Past Surgical History:  Procedure Laterality Date  . LYMPHADENECTOMY      OB History   No obstetric history on file.      Home Medications    Prior to Admission medications   Medication Sig Start Date End Date Taking? Authorizing Provider  cetirizine (ZYRTEC ALLERGY) 10 MG tablet Take 1 tablet (10 mg total) by mouth daily. 06/07/20  Yes Hazel Sams, PA-C  lidocaine (XYLOCAINE) 2 % solution Use as directed 15 mLs in the mouth or throat as needed for mouth pain. 06/07/20  Yes Hazel Sams, PA-C  losartan-hydrochlorothiazide (HYZAAR) 100-25 MG tablet Take 1 tablet by mouth daily. 04/30/20  Yes [provider]  predniSONE (DELTASONE) 20 MG tablet Take 2 tablets (40 mg total) by mouth daily for 5 days. 06/07/20 06/12/20 Yes Hazel Sams, PA-C    Family History Family History  Problem Relation Age of Onset  . Hypertension Mother   . Hypertension Father     Social History Social History   Tobacco Use  . Smoking status: Never Smoker  . Smokeless tobacco: Never Used  Substance Use Topics  . Alcohol use: Yes     Allergies   Valsartan and Penicillins   Review of Systems Review of Systems  Constitutional: Negative for appetite change, chills and fever.  HENT: Positive for sore throat. Negative for congestion, ear pain, rhinorrhea, sinus  pressure and sinus pain.   Eyes: Negative for redness and visual disturbance.  Respiratory: Positive for cough. Negative for chest tightness, shortness of breath and wheezing.   Cardiovascular: Negative for chest pain and palpitations.  Gastrointestinal: Negative for abdominal pain, constipation, diarrhea, nausea and vomiting.  Genitourinary: Negative for dysuria, frequency and urgency.  Musculoskeletal: Negative for myalgias.  Neurological: Negative for dizziness, weakness and headaches.  Psychiatric/Behavioral: Negative for confusion.  All other systems reviewed and are negative.    Physical Exam Triage Vital Signs ED Triage Vitals  Enc Vitals Group     BP      Pulse      Resp      Temp      Temp src      SpO2      Weight      Height      Head Circumference      Peak Flow      Pain Score      Pain Loc      Pain Edu?      Excl. in [place]?    No data found.  Updated Vital Signs BP 133/85 (BP Location: Left Arm)   Pulse 93   Temp 98.3 F (36.8 C) (Oral)   Resp 17   SpO2 98%   Visual Acuity Right Eye Distance:   Left Eye Distance:   Bilateral Distance:    Right Eye  Near:   Left Eye Near:    Bilateral Near:     Physical Exam Vitals reviewed.  Constitutional:      General: She is not in acute distress.    Appearance: Normal appearance. She is not ill-appearing.  HENT:     Head: Normocephalic and atraumatic.     Right Ear: Hearing, tympanic membrane, ear canal and external ear normal. No swelling or tenderness. There is no impacted cerumen. No mastoid tenderness. Tympanic membrane is not perforated, erythematous, retracted or bulging.     Left Ear: Hearing, tympanic membrane, ear canal and external ear normal. No swelling or tenderness. There is no impacted cerumen. No mastoid tenderness. Tympanic membrane is not perforated, erythematous, retracted or bulging.     Nose:     Right Sinus: No maxillary sinus tenderness or frontal sinus tenderness.     Left Sinus: No  maxillary sinus tenderness or frontal sinus tenderness.     Mouth/Throat:     Mouth: Mucous membranes are moist.     Pharynx: Uvula midline. No oropharyngeal exudate or posterior oropharyngeal erythema.     Tonsils: No tonsillar exudate. 0 on the right. 0 on the left.     Comments: Smooth erythema posterior pharynx.  Tonsils are not visible. Cardiovascular:     Rate and Rhythm: Normal rate and regular rhythm.     Heart sounds: Normal heart sounds.  Pulmonary:     Breath sounds: Normal breath sounds and air entry. No wheezing, rhonchi or rales.  Chest:     Chest wall: No tenderness.  Abdominal:     General: Abdomen is flat. Bowel sounds are normal.     Tenderness: There is no abdominal tenderness. There is no guarding or rebound.  Lymphadenopathy:     Cervical: No cervical adenopathy.  Skin:    Capillary Refill: Capillary refill takes less than 2 seconds.  Neurological:     General: No focal deficit present.     Mental Status: She is alert and oriented to person, place, and time.  Psychiatric:        Attention and Perception: Attention and perception normal.        Mood and Affect: Mood and affect normal.        Behavior: Behavior normal. Behavior is cooperative.        Thought Content: Thought content normal.        Judgment: Judgment normal.      UC Treatments / Results  Labs (all labs ordered are listed, but only abnormal results are displayed) Labs Reviewed - No data to display  EKG   Radiology No results found.  Procedures Procedures (including critical care time)  Medications Ordered in UC Medications - No data to display  Initial Impression / Assessment and Plan / UC Course  I have reviewed the triage vital signs and the nursing notes.  Pertinent labs & imaging results that were available during my care of the patient were reviewed by me and considered in my medical decision making (see chart for details).     This patient is a 50 year old female  presenting with viral pharyngitis and allergic rhinitis. Today this pt is afebrile nontachycardic nontachypneic, oxygenating well on room air, no wheezes rhonchi or rales. On exam she is tolerating her secretions without difficulty, there is no trismus, no drooling, she has normal phonation  Centor score 0, rapid strep deferred. COVID PCR declined.  Viscous lidocaine and prednisone as below.  She is not a diabetic.  Tylenol  and ibuprofen for symptomatic relief.  ED return precautions discussed.  Final Clinical Impressions(s) / UC Diagnoses   Final diagnoses:  Viral URI with cough  Seasonal allergic rhinitis due to pollen  Viral pharyngitis     Discharge Instructions     -Prednisone two pills taken at the same time for 5 days. This will help reduce the inflammation in your throat. I recommend taking this in the morning as it could give you energy.  -For sore throat, use lidocaine mouthwash up to every 4 hours. Make sure not to eat for at least 1 hour after using this, as your mouth will be very numb and you could bite yourself. -Take Tylenol 1000 mg 3 times daily, and ibuprofen 800 mg 3 times daily with food.  You can take these together, or alternate every 3-4 hours. -You can also try Zyrtec for the allergic rhinitis component.  Try this 1 pill daily for about a week, continue for longer if it's helping. -Seek additional medical attention if you develop new symptoms like trouble swallowing, dizziness, chest pain, shortness of breath.     ED Prescriptions    Medication Sig Dispense Auth. Provider   lidocaine (XYLOCAINE) 2 % solution Use as directed 15 mLs in the mouth or throat as needed for mouth pain. 100 mL Hazel Sams, PA-C   predniSONE (DELTASONE) 20 MG tablet Take 2 tablets (40 mg total) by mouth daily for 5 days. 10 tablet Hazel Sams, PA-C   cetirizine (ZYRTEC ALLERGY) 10 MG tablet Take 1 tablet (10 mg total) by mouth daily. 30 tablet Hazel Sams, PA-C     PDMP  not reviewed this encounter.   Hazel Sams, PA-C 06/07/20 1547

## 2020-06-14 ENCOUNTER — Ambulatory Visit
Admission: RE | Admit: 2020-06-14 | Discharge: 2020-06-14 | Disposition: A | Discharge status: Home / Self Care | Payer: Commercial Managed Care - POS | Source: Ambulatory Visit | Attending: Internal Medicine | Admitting: Internal Medicine

## 2020-06-14 ENCOUNTER — Other Ambulatory Visit: Payer: Self-pay | Admitting: Internal Medicine

## 2020-06-14 ENCOUNTER — Encounter (HOSPITAL_BASED_OUTPATIENT_CLINIC_OR_DEPARTMENT_OTHER): Payer: Self-pay

## 2020-06-14 DIAGNOSIS — C50412 Malignant neoplasm of upper-outer quadrant of left female breast: Secondary | ICD-10-CM | POA: Insufficient documentation

## 2020-06-14 DIAGNOSIS — Z171 Estrogen receptor negative status [ER-]: Secondary | ICD-10-CM

## 2020-06-18 ENCOUNTER — Other Ambulatory Visit (INDEPENDENT_AMBULATORY_CARE_PROVIDER_SITE_OTHER): Payer: Self-pay | Admitting: Cardiovascular Disease

## 2020-07-04 ENCOUNTER — Encounter: Payer: Self-pay | Admitting: Rehabilitation

## 2020-07-04 ENCOUNTER — Ambulatory Visit: Payer: Managed Care, Other (non HMO) | Attending: Family | Admitting: Rehabilitation

## 2020-07-04 ENCOUNTER — Other Ambulatory Visit: Payer: Self-pay

## 2020-07-04 DIAGNOSIS — G8929 Other chronic pain: Secondary | ICD-10-CM | POA: Insufficient documentation

## 2020-07-04 DIAGNOSIS — M25511 Pain in right shoulder: Secondary | ICD-10-CM | POA: Insufficient documentation

## 2020-07-04 DIAGNOSIS — M25512 Pain in left shoulder: Secondary | ICD-10-CM | POA: Diagnosis present

## 2020-07-04 DIAGNOSIS — I890 Lymphedema, not elsewhere classified: Secondary | ICD-10-CM | POA: Insufficient documentation

## 2020-07-04 NOTE — Patient Instructions (Signed)
Access Code: [email or web address] URL: [email or web address]: 05/18/2022Prepared by: Marcene Brawn TevisExercises  Seated Scapular Retraction - 3 x daily - 7 x weekly - 1 sets - 10 reps - 2-3 seconds hold  Standing Wrist Extension Stretch - 3 x daily - 7 x weekly - 1 sets - 3 reps - 20-30 seconds hold  Supine Shoulder Flexion AAROM with Hands Clasped - 1 x daily - 7 x weekly - 1 sets - 10 reps - 2-3 hold  Standing Single Shoulder Flexion Wall Slide with Palm Up - 1 x daily - 7 x weekly - 1-3 sets - 10 reps - no hold  Standing Shoulder Abduction Finger Walk at Wall - 1 x daily - 7 x weekly - 1-3 sets - 10 reps - no hold  Circular Shoulder Pendulum with Table Support - 1 x daily - 7 x weekly - 1-3 sets - 10 reps - no hold

## 2020-07-04 NOTE — Therapy (Signed)
[place], [place], [address] Phone: [phone]   Fax:  [phone]  Physical Therapy Evaluation  Patient Details  Name: Olivia [name] MRN: [id number] Date of Birth: March 07, 1970 Referring Provider (PT): Olin Hauser, FNP   Encounter Date: 07/04/2020   PT End of Session - 07/04/20 1159    Visit Number 1    Number of Visits 9    Date for PT Re-Evaluation 10/24/20    PT Start Time 1100    PT Stop Time 1155    PT Time Calculation (min) 55 min    Activity Tolerance Patient tolerated treatment well    Behavior During Therapy [facility] for tasks assessed/performed           Past Medical History:  Diagnosis Date  . Breast cancer ([place])   . Hypertension     Past Surgical History:  Procedure Laterality Date  . LYMPHADENECTOMY      There were no vitals filed for this visit.    Subjective Assessment - 07/04/20 1057    Subjective I am having some pain in the shoulders, not sure if I have some swelling, I am also having some leg pain on the left leg    Pertinent History Left breast cancer IDC stage 2 with lumpectomy and bilateral reduction and lift and 0/2 nodes removed on 10/14/19 in [place].  Radiation and chemotherapy completed. 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    Limitations Lifting   i can't reach in cabinet, drag the shower curtain, steering wheel, reach the neck   Diagnostic tests recent body scan showing possible 5th rib issue met vs old issue    Patient Stated Goals use the arms better    Currently in Pain? Yes    Pain Score 4     Pain Location Arm   back of arms and top of shoulders bil   Pain Orientation Right;Left    Pain Descriptors / Indicators Aching;Discomfort    Pain Type Chronic pain    Pain Onset More than a month ago    Pain Frequency Constant    Aggravating Factors  using the arms    Pain Relieving Factors OA cream    Multiple Pain Sites Yes               [facility] PT Assessment - 07/04/20 0001      Assessment   Medical Diagnosis left breast cancer    Referring Provider (PT) Olin Hauser, FNP    Onset Date/Surgical Date 10/14/19    Hand Dominance Right    Next MD Visit 07/18/20    Prior Therapy yes in [place]      Precautions   Precaution Comments lymphedema Lt UE      Restrictions   Weight Bearing Restrictions No      Balance Screen   Has the patient fallen in the past 6 months No    Has the patient had a decrease in activity level because of a fear of falling?  No    Is the patient reluctant to leave their home because of a fear of falling?  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    Leisure not currently      Cognition   Overall Cognitive Status Within Functional Limits for tasks assessed      Observation/Other Assessments   Observations guarded upper body      Coordination   Gross Motor Movements are Fluid and Coordinated Yes      Posture/Postural Control   Posture/Postural Control Postural limitations    Postural Limitations Rounded Shoulders;Forward head      ROM / Strength   AROM / PROM / Strength AROM;PROM;Strength      AROM   AROM Assessment Site Shoulder    Right/Left Shoulder Right;Left    Right Shoulder Extension 69 Degrees    Right Shoulder Flexion 150 Degrees   pain with raise and lower   Right Shoulder ABduction 88 Degrees   pain anterior shoulder   Left Shoulder Extension 55 Degrees    Left Shoulder Flexion 53 Degrees   pain top of shoulder   Left Shoulder ABduction 70 Degrees   the whole arm hurts     PROM   Overall PROM Comments all painful with  guarding    PROM Assessment Site Shoulder    Right/Left Shoulder Right;Left    Right Shoulder Flexion 45 Degrees    Right Shoulder ABduction 40 Degrees    Right Shoulder External Rotation 45 Degrees    Left Shoulder Flexion 130 Degrees    Left Shoulder ABduction 95 Degrees    Left Shoulder External Rotation 45 Degrees      Palpation   Palpation comment left breast lymphedema with fibrosis inferior and medial breast as well as increased scar tissue overall.  soft edema anterior chest supraclavicular area             LYMPHEDEMA/ONCOLOGY QUESTIONNAIRE - 07/04/20 0001      Type   Cancer Type left breast      Surgeries   Lumpectomy Date 10/14/19    Sentinel Lymph Node Biopsy Date 10/14/19    Number Lymph Nodes Removed 2      Treatment   Past Chemotherapy Treatment Yes    Past Radiation Treatment Yes    Current Hormone Treatment No      What other symptoms do you have   Are you Having Heaviness or Tightness No    Are you having Pain Yes    Are you having pitting edema No      Lymphedema Assessments   Lymphedema Assessments Upper extremities      Right Upper Extremity Lymphedema   At Axilla  43.6 cm    15 cm Proximal to Olecranon Process 41 cm    10 cm Proximal to Olecranon Process 39.8 cm    Olecranon Process 32.6 cm    15 cm Proximal to Ulnar Styloid Process 30.5 cm    10 cm Proximal to Ulnar Styloid Process 26.5 cm    Just Proximal to Ulnar Styloid Process 18.7 cm    Across Hand at [employer] 21.9 cm    At [place] of 2nd Digit 6.7 cm      Left Upper Extremity Lymphedema   15 cm Proximal to Olecranon Process 40.2 cm    10 cm Proximal to Olecranon Process 40.3 cm    Olecranon Process 32.5 cm    15 cm Proximal to Ulnar Styloid Process 30.1 cm    10 cm Proximal to Ulnar Styloid Process  27 cm    Just Proximal to Ulnar Styloid Process 19.3 cm    Across Hand at [employer] 21 cm    At [place] of 2nd Digit 6.4 cm           L-DEX FLOWSHEETS - 07/04/20 1100       L-DEX LYMPHEDEMA SCREENING   Measurement Type Unilateral    L-DEX MEASUREMENT EXTREMITY Upper Extremity    POSITION  Standing    DOMINANT SIDE Right    At Risk Side Left    L-DEX SCORE (UNILATERAL) -1                  Objective measurements completed on examination: See above findings.       [facility] Adult PT Treatment/Exercise - 07/04/20 0001      Self-Care   Self-Care Posture    Posture discussed posture and importance during work; how guarding and pain will occur with AAROM but to know that nothing is being injured and hurt but to find a happy medium.  Also discussed bra wear.      Exercises   Exercises Other Exercises    Other Exercises  given handout per instruction section with vcs for each and demonstration                  PT Education - 07/04/20 1159    Education Details HEP, breast lymphedema, SOZO, POC    Person(s) Educated Patient    Methods Explanation;Demonstration;Verbal cues;Handout    Comprehension Verbalized understanding;Need further instruction               PT Long Term Goals - 07/04/20 1209      PT LONG TERM GOAL #1   Title Pt will improve bil shoulder flexion to at least 150 to demonstrate improved mobility    Time 16    Period Weeks    Status New      PT LONG TERM GOAL #2   Title Pt will improve bil shoulder abduction to at least 140 to demonstrate improved mobility    Time 16    Period Weeks    Status New      PT LONG TERM GOAL #3   Title Pt will decrease shoulder pain to 0/10 at rest    Time 16    Period Weeks    Status New      PT LONG TERM GOAL #4   Title Pt will be ind with self MLD for the left breast lymphedema    Time 16    Period Weeks    Status New      PT LONG TERM GOAL #5   Title Pt will be ind with final HEP for continued mobility    Time 16    Period Weeks    Status New                  Plan - 07/04/20 1200    Clinical Impression Statement Pt presents 9 months post left lumpectomy  with radiation and chemotherapy completed as well as bilateral breast reduction and lift with left breast lymphedema, bil shoulder pain, decreased shoulder ROM bilaterally, limitations with ADLs, guarding, and poor posture.  ROM limitations are guarded/ painful in nature not joint or muscle stiffness per PROM testing.  pt works a Network engineer job all day with poor posture and limited activity overall.  Breast lymphedema is also present although pt reports the breast is not painful or heavy feeling.  Pt  is only able to do 1x every 2 weeks due to work schedule and no time off.    Personal Factors and Comorbidities Fitness;Comorbidity 3+    Comorbidities radiation, chemotherapy history, SLNB, chronic nature    Examination-Activity Limitations Lift;Reach Overhead;Carry    Examination-Participation Restrictions Cleaning;Meal Prep;Yard Work;Occupation    Stability/Clinical Decision Making Evolving/Moderate complexity    Clinical Decision Making Moderate    Rehab Potential Good    PT Frequency Biweekly    PT Duration --   16   PT Treatment/Interventions ADLs/Self Care Home Management;Therapeutic exercise;Patient/family education;Manual lymph drainage;Taping;Manual techniques;Passive range of motion;DME Instruction    PT Next Visit Plan how is ROM?  bil shoulder pulleys, ranger, AAROM, PROM watching pain and guarding, advance TE as needed, incorporate self breast MLD education and MLD as able, may want assessment of Lt LE at some point    PT Home Exercise Plan Access Code: [email or web address]    Recommended Other Services bra (which pt has)    Consulted and Agree with Plan of Care Patient           Patient will benefit from skilled therapeutic intervention in order to improve the following deficits and impairments:  Decreased skin integrity,Increased edema,Decreased knowledge of precautions,Decreased range of motion,Decreased strength,Impaired UE functional use,Postural dysfunction,Pain  Visit Diagnosis: Lymphedema, not  elsewhere classified  Chronic left shoulder pain  Chronic right shoulder pain     Problem List There are no problems to display for this patient.   Stark Bray 07/04/2020, 12:12 PM  [place], [place], [address] Phone: [phone]   Fax:  [phone]  Name: Maley Venezia MRN: [id number] Date of Birth: 10-Jul-1970

## 2020-07-17 ENCOUNTER — Ambulatory Visit: Payer: Managed Care, Other (non HMO) | Admitting: Physical Therapy

## 2020-07-17 ENCOUNTER — Other Ambulatory Visit: Payer: Self-pay

## 2020-07-17 ENCOUNTER — Encounter: Payer: Self-pay | Admitting: Physical Therapy

## 2020-07-17 DIAGNOSIS — M25512 Pain in left shoulder: Secondary | ICD-10-CM

## 2020-07-17 DIAGNOSIS — I890 Lymphedema, not elsewhere classified: Secondary | ICD-10-CM | POA: Diagnosis not present

## 2020-07-17 DIAGNOSIS — G8929 Other chronic pain: Secondary | ICD-10-CM

## 2020-07-17 NOTE — Therapy (Signed)
[place], [place], [address] Phone: [phone]   Fax:  [phone]  Physical Therapy Treatment  Patient Details  Name: Olivia [name] MRN: [id number] Date of Birth: September 21, 1970 Referring Provider (PT): Olin Hauser, FNP   Encounter Date: 07/17/2020   PT End of Session - 07/17/20 1357    Visit Number 2    Number of Visits 9    Date for PT Re-Evaluation 10/24/20    PT Start Time 7672    PT Stop Time 1355    PT Time Calculation (min) 52 min    Activity Tolerance Patient tolerated treatment well    Behavior During Therapy [facility] for tasks assessed/performed           Past Medical History:  Diagnosis Date  . Breast cancer ([place])   . Hypertension     Past Surgical History:  Procedure Laterality Date  . LYMPHADENECTOMY      There were no vitals filed for this visit.   Subjective Assessment - 07/17/20 1306    Subjective I have been wearing the zip up bra. I can't tell any difference in the swelling. I was doing my exercises for my shoulder at home but did not do much over the weekend. I was sleeping on an air mattress over the weekend so I feel tight again.    Pertinent History Left breast cancer IDC stage 2 with lumpectomy and bilateral reduction and lift and 0/2 nodes removed on 10/14/19 in [place].  Radiation and chemotherapy completed. 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 Yes    Pain Score 2     Pain Location Shoulder    Pain Orientation Left    Pain Descriptors / Indicators Discomfort    Pain Type Chronic pain    Pain Onset More than a month ago    Pain Frequency Constant    Aggravating Factors  lifting arms upwards and outwards    Pain Relieving Factors nothing    Effect of Pain on Daily Activities hard to lift arms or use arms              [facility] PT Assessment - 07/17/20 0001      PROM    Left Shoulder Flexion 104 Degrees (P)     Left Shoulder ABduction 72 Degrees (P)                          [facility] Adult PT Treatment/Exercise - 07/17/20 0001      Exercises   Exercises Shoulder      Shoulder Exercises: Pulleys   Flexion 2 minutes   v/c for upright posture   Scaption 2 minutes   v/c for upright posture and to decrease scapular compensation     Manual Therapy   Manual Therapy Passive ROM;Scapular mobilization;Soft tissue mobilization    Soft tissue mobilization to L lateral trunk to lateral edge of lats and serratus and periscapular muscles using cocoa butter with numerous areas of tightness noted that did release with treatment    Scapular Mobilization in R sidelying to L scapua in direction of retraction and protraction with pt extrememly limited with scapular mobility but improved greatly - pt had increased comfort with ROM following scapular mobilization    Passive ROM to left shoulder in direction of flexion and abduction  PT Long Term Goals - 07/04/20 1209      PT LONG TERM GOAL #1   Title Pt will improve bil shoulder flexion to at least 150 to demonstrate improved mobility    Time 16    Period Weeks    Status New      PT LONG TERM GOAL #2   Title Pt will improve bil shoulder abduction to at least 140 to demonstrate improved mobility    Time 16    Period Weeks    Status New      PT LONG TERM GOAL #3   Title Pt will decrease shoulder pain to 0/10 at rest    Time 16    Period Weeks    Status New      PT LONG TERM GOAL #4   Title Pt will be ind with self MLD for the left breast lymphedema    Time 16    Period Weeks    Status New      PT LONG TERM GOAL #5   Title Pt will be ind with final HEP for continued mobility    Time 16    Period Weeks    Status New                 Plan - 07/17/20 1403    Clinical Impression Statement Began AAROM includuing pulleys and ball. Pt had discomfort in upper  shoulder so educated pt to correct posture and this helped to decrease discomfort. Attempted PROM to L shoulder in supine initially but pt had increased pain and discomfort so had pt lie on her right side for L scapular mobilization in to retraction and protraction. Pt was extremely limited with barely any movement initially but this increased greatly. Was then able to work on PROM in supine without increased pain in L upper shoulder. Pt felt more stretching in axilla. Educated pt to continue to work on massage to [employer] and serratus as this was also extremely tight and released greatly today allowing improve ROM since pt is only able to come every other week. Will continue to monitor her ROM and if it does not improvement after several visits, pt may benefit from seeing an ortho dr.    PT Frequency Biweekly    PT Duration --   15 weeks   PT Treatment/Interventions ADLs/Self Care Home Management;Therapeutic exercise;Patient/family education;Manual lymph drainage;Taping;Manual techniques;Passive range of motion;DME Instruction    PT Next Visit Plan do scap mobs and STM to lats and serratus on L, how is ROM? ifnot improving have pt see ortho dr,  bil shoulder pulleys, ranger, AAROM, PROM watching pain and guarding, advance TE as needed, incorporate self breast MLD education and MLD as able, may want assessment of Lt LE at some point    PT Home Exercise Plan Access Code: [email or web address]    Consulted and Agree with Plan of Care Patient           Patient will benefit from skilled therapeutic intervention in order to improve the following deficits and impairments:  Decreased skin integrity,Increased edema,Decreased knowledge of precautions,Decreased range of motion,Decreased strength,Impaired UE functional use,Postural dysfunction,Pain  Visit Diagnosis: Chronic left shoulder pain     Problem List There are no problems to display for this patient.   Allyson Sabal [facility] 07/17/2020, 2:08 PM  [place], [place], [address] Phone: [phone]   Fax:  [phone]  Name: Olivia [name] MRN: [id number] Date of Birth:  [birth year]  Manus Gunning, PT 07/17/20 2:08 PM

## 2020-07-20 ENCOUNTER — Telehealth: Payer: Self-pay | Admitting: Internal Medicine

## 2020-07-20 ENCOUNTER — Ambulatory Visit: Payer: Commercial Managed Care - POS | Attending: Internal Medicine | Admitting: Internal Medicine

## 2020-07-20 ENCOUNTER — Encounter: Payer: Self-pay | Admitting: Internal Medicine

## 2020-07-20 VITALS — BP 111/79 | HR 60 | Temp 98.3°F | Resp 19 | Ht 69.1 in | Wt 277.6 lb

## 2020-07-20 DIAGNOSIS — C50412 Malignant neoplasm of upper-outer quadrant of left female breast: Secondary | ICD-10-CM

## 2020-07-20 DIAGNOSIS — Z171 Estrogen receptor negative status [ER-]: Secondary | ICD-10-CM

## 2020-07-20 DIAGNOSIS — Z5181 Encounter for therapeutic drug level monitoring: Secondary | ICD-10-CM

## 2020-07-20 DIAGNOSIS — M5412 Radiculopathy, cervical region: Secondary | ICD-10-CM

## 2020-07-20 DIAGNOSIS — G620 Drug-induced polyneuropathy: Secondary | ICD-10-CM

## 2020-07-20 DIAGNOSIS — C50912 Malignant neoplasm of unspecified site of left female breast: Secondary | ICD-10-CM

## 2020-07-20 DIAGNOSIS — Z79899 Other long term (current) drug therapy: Secondary | ICD-10-CM

## 2020-07-20 DIAGNOSIS — T451X5A Adverse effect of antineoplastic and immunosuppressive drugs, initial encounter: Secondary | ICD-10-CM

## 2020-07-20 MED ORDER — LIDOCAINE-PRILOCAINE 2.5-2.5 % EX CREA
TOPICAL_CREAM | CUTANEOUS | 1 refills | Status: DC | PRN
Start: 2020-07-20 — End: 2021-11-01

## 2020-07-20 NOTE — Telephone Encounter (Signed)
Patient is scheduled for f/u 12/5 at 1140am with lab appointment.     Clinical please place lab order and link to appointment on 12/5. Thank you

## 2020-07-20 NOTE — Progress Notes (Signed)
[facility]  [address] [name]  [phone]      [name] Office  [phone]  Visit Date: 07/20/2020    Chief Complaint   Patient presents with   . Follow-up     Continuation of managed care for breast cancer 1/10 left leg          HISTORY OF PRESENT ILLNESS:  Neuropathy has improved. Continues to have hip and shoulder pain. Working with PT. Bone scan 05/21/2020 negative for metastases.     Oncology History Overview Note   Physician Requesting Consult: de Lawson Radar, Dairl Ponder, MD    Breast Surgeon: Orlene Och, MD  Radiation Oncologist:  Plastic Surgeon:   PCP: Satira Mccallum, MD      Leslie [name] is a 50 y.o.-year-old female with newly diagnosed IDC of the left breast.   She presented with abnormal mammogram.     Bilateral screening mammogram 02/2019 showed left breast 3.1 x 2 cm hypoechoic mass and midly enlarged axillary node abnormality.     [facility] left breast mass 2 OC and left axillary node 03/07/2019 [facility]) showed:   A) Breast: IDC, G3, ER 0%, PR 0%, Her 2 neu negative (1+) by IHC, Ki 67 80%  B) B) Lymph node, left axillary: benign-appearing lymph node.    Bilateral breast MRI and Genetic consult ordered. Path review pending.       Breast Cancer Risk Factors:  Age at Menarche:  34  Age at 70st FTP:  77    Still menstruating?:  No   Last menses: 2008   Hysterectomy: Yes 2008 TAH+ left oophorectomy   Estrogen Therapy:          Oral Contraceptives: No           Hormone Replacement Therapy:  No         Reproductive Assistance Therapies:  No  Family History of Cancer:            Breast:   Yes maternal aunt age 64, maternal cousin age 66, paternal cousin age 31,       Ashkenazi Jewish Ancestry: No     Genetic testing: pending       Denies F/C/N/V/diarrhea. No CP/SOB. No HA/visual changes/focal weakness.        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, estrogen receptor negative   03/29/2019 Initial Diagnosis    Malignant neoplasm of upper outer quadrant of  breast in female, estrogen receptor negative     04/14/2019 - 06/09/2019 Chemotherapy    cyclophosphamide (CYTOXAN) 1,500 mg in sodium chloride 0.9 % 360 mL chemo infusion, 1,554 mg, Intravenous, Once, 4 of 4 cycles  Administration: 1,500 mg (04/14/2019), 1,500 mg (04/28/2019), 1,500 mg (05/12/2019), 1,500 mg (06/09/2019)  pegfilgrastim (NEULASTA) injection 6 mg, 6 mg, Subcutaneous, Once, 2 of 2 cycles  Administration: 6 mg (04/14/2019), 6 mg (04/29/2019)  pegfilgrastim (NEULASTA) delivery kit 6 mg, 6 mg, Subcutaneous, Once, 3 of 3 cycles  Administration: 6 mg (04/28/2019), 6 mg (05/12/2019), 6 mg (06/09/2019)  DOXOrubicin (ADRIAMYCIN) 155 mg in sodium chloride 0.9 % 362.5 mL chemo infusion, 60 mg/m2 = 155 mg, Intravenous, Once, 4 of 4 cycles  Administration: 155 mg (04/14/2019), 155 mg (04/28/2019), 155 mg (05/12/2019), 155 mg (06/09/2019)     06/23/2019 - 09/15/2019 Chemotherapy    CARBOplatin (PARAPLATIN) 225 mg in sodium chloride 0.9 % 307.5 mL chemo infusion, 225 mg, Intravenous, Once, 2 of 2 cycles  Administration: 225  mg (06/23/2019), 225 mg (06/30/2019), 225 mg (07/07/2019), 225 mg (07/14/2019)  PACLitaxel (TAXOL) 210 mg in sodium chloride 0.9 % 320 mL chemo infusion, 80 mg/m2 = 210 mg, Intravenous, Once, 4 of 4 cycles  Dose modification: 64 mg/m2 (80 % of original dose 80 mg/m2, Cycle 4, Reason: Dose Not Tolerated)  Administration: 210 mg (06/23/2019), 210 mg (06/30/2019), 210 mg (07/07/2019), 210 mg (07/14/2019), 210 mg (07/28/2019), 210 mg (08/04/2019), 210 mg (08/11/2019), 210 mg (08/18/2019), 210 mg (08/25/2019), 210 mg (09/01/2019), 168 mg (09/15/2019)     10/14/2019 Surgery    S/p left lumpectomy/SLNBx     10/31/2019 Cancer Staged    Staging form: Breast, AJCC 8th Edition  - Clinical stage from 10/31/2019: Stage IIB (cT2, cN0, cM0, G3, ER-, PR-, HER2-) - Signed by [name], MD on 10/31/2019       10/31/2019 Cancer Staged    Staging form: Breast, AJCC 8th Edition  - Pathologic stage from 10/31/2019: No Stage Recommended (ypT0, pN0(sn),  cM0) - Signed by [name], MD on 10/31/2019       12/05/2019 - 01/09/2020 Radiation    Radiation        Cancer Staging  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, estrogen receptor negative  Staging form: Breast, AJCC 8th Edition  - Clinical stage from 10/31/2019: Stage IIB (cT2, cN0, cM0, G3, ER-, PR-, HER2-) - Signed by [name], MD on 10/31/2019  Stage prefix: Initial diagnosis  Method of lymph node assessment: Clinical  Histologic grading system: 3 grade system  Stage used in treatment planning: Yes  National guidelines used in treatment planning: Yes  Type of national guideline used in treatment planning: NCCN  - Pathologic stage from 10/31/2019: No Stage Recommended (ypT0, pN0(sn), cM0) - Signed by [name], MD on 10/31/2019  Stage prefix: Post-therapy  Response to neoadjuvant therapy: Complete response  Method of lymph node assessment: Sentinel lymph node biopsy  Stage used in treatment planning: Yes  National guidelines used in treatment planning: Yes  Type of national guideline used in treatment planning: NCCN      Past Medical History:   Diagnosis Date   . Anemia     iron deficient, recent chemo//takes po iron supplement   . Clot     mediport site r chest, on elliquis   . Fibroid    . Gastroesophageal reflux disease     indigestion while on chemo   . Herpes simplex without mention of complication 2003    possible recurrance in 2013//no issuses since   . Hypertension     well controlled w/ medication   . Lymphedema of extremity 11/03/2017    L foot   . Major depressive disorder with single episode, in full remission    . Malignant neoplasm of breast 02/2019    L breast ca, s/p chemo 08/2019, no radiation   . Neurologic disorder     brachial neuritis   . Neuropathy     recednt chemo/hands/feet   . Obesity    . Vitamin D deficiency      Past Surgical History:   Procedure Laterality Date   . BIOPSY, SENTINEL NODE Left 10/14/2019    Procedure: BIOPSY, SENTINEL NODE;  Surgeon:  Boykin Peek, MD;  Location: [place] MAIN OR;  Service: General;  Laterality: Left;   . BREAST SURGERY  10/14/19   . BREAST, LUMPECTOMY Left 10/14/2019    Procedure: BREAST, LUMPECTOMY, WITH MAG SEED LOCALIZATION;  Surgeon: Boykin Peek, MD;  Location: [place] MAIN OR;  Service: General;  Laterality: Left;   . HYSTERECTOMY  2008    fibroids, readmitted for pelvic abscess formation POD#3   . MAMMOPLASTY, REDUCTION (MEDICAL) Right 10/14/2019    Procedure: MAMMOPLASTY, REDUCTION (MEDICAL);  Surgeon: Felipe Drone, MD;  Location: [name];  Service: Plastics;  Laterality: Right;   . MAMMOPLASTY/ONCOPLASTY Left 10/14/2019    Procedure: MAMMOPLASTY/ONCOPLASTY;  Surgeon: Felipe Drone, MD;  Location: [name];  Service: Plastics;  Laterality: Left;  Right Wt: 1318g  Left Wt: 1319g   . MEDIPORT CHECK Right 06/08/2019    Procedure: MEDIPORT CHECK;  Surgeon: Rex Kras, MD;  [facility];  Service: Interventional Radiology;  Laterality: Right;   . MEDIPORT PLACEMENT N/A 04/08/2019    Procedure: MEDIPORT PLACEMENT;  Surgeon: Pollyann Kennedy, MD;  [facility]: [facility];  Service: Interventional Radiology;  Laterality: N/A;   . OTHER SURGICAL HISTORY  05/04/2015    Obalon balloon - have 3 balloons placed in stomach for weight loss, Dr. Georgann Housekeeper   . REMOVAL, MEDIPORT Right 10/14/2019    Procedure: REMOVAL, MEDIPORT;  Surgeon: Boykin Peek, MD;  Location: [place] MAIN OR;  Service: General;  Laterality: Right;   . SALPINGOOPHORECTOMY Right 2008    RSO with hysterectomy   . TUBAL LIGATION  02/17/1997     Social History     Socioeconomic History   . Marital status: Legally Separated     Spouse name: None   . Number of children: None   . Years of education: None   . Highest education level: None   Occupational History   . None   Tobacco Use   . Smoking status: Never Smoker   . Smokeless tobacco: Never Used   Vaping Use   . Vaping Use: Never used   Substance and Sexual Activity   . Alcohol  use: Not Currently     Alcohol/week: 0.0 standard drinks   . Drug use: No     Comment: CBD oil/last use month ago   . Sexual activity: Not Currently     Partners: Female     Birth control/protection: None   Other Topics Concern   . None   Social History Narrative    Has elliptical machine        Lives with boyfriend, daughter home        Son died on 01-19-2019 in CCU            Mom staying with her, good support system 05/30/2019        Moved to [facility] 04/2020     Social Determinants of Health     Financial Resource Strain: Not on file   Food Insecurity: Not on file   Transportation Needs: Not on file   Physical Activity: Insufficiently Active   . Days of Exercise per Week: 7 days   . Minutes of Exercise per Session: 20 min   Stress: Not on file   Social Connections: Not on file   Intimate Partner Violence: Not on file   Housing Stability: Not on file      Family History   Problem Relation Age of Onset   . Hypertension Mother    . Coronary artery disease Mother    . Heart disease Mother    . Hyperlipidemia Mother    . Hypertension Father    . Diabetes Father    . Other Father  cardiac catheterization   . Hypertension Sister    . Breast cancer Maternal Aunt    . Uterine cancer Maternal Aunt    . Vaginal cancer Maternal Aunt    . Breast cancer Other         maternal cousin   . Ovarian cancer Neg Hx        Allergies   Allergen Reactions   . Gabapentin Facial Swelling     Eye swelling   . Valsartan Other (See Comments)     achiness   . Penicillins Hives     Symptoms occurred as child  Onset date: 07-28-2004     has a current medication list which includes the following prescription(s): losartan-hydrochlorothiazide, lidocaine-prilocaine, and tizanidine.        REVIEW OF SYSTEMS: 14 pt ROS reviewed with the patient and negative except for as documented in the HPI. All other systems reviewed.      PHYSICAL EXAM:  BP 111/79 (BP Site: Left arm, Patient Position: Sitting, Cuff Size: Large)   Pulse 60   Temp 98.3 F (36.8  C) (Oral)   Resp 19   Ht 1.755 m (5' 9.1")   Wt 125.9 kg (277 lb 9.6 oz)   SpO2 98%   BMI 40.88 kg/m         Performance Status:  1 - Restricted in physically strenuous activity but ambulatory and able to carry out work of a light or sedentary nature, e.g., light house work      General Appearance: Alert, cooperative, no distress, appears stated age  Eyes: conjunctiva/corneas clear bilaterally  Head:  Normocephalic, without obvious abnormality, atraumatic  Neck: Supple, symmetrical, trachea midline, no adenopathy  Lungs: Clear to auscultation bilaterally, respirations unlabored  Chest Wall: No tenderness or deformity  Heart: Regular rate and rhythm, S1 and S2 normal, no murmur, rub or gallop  Breast Exam: Left partial mastectomy with no evidence of local recurrence and normal right breast  Abdomen: Soft, non-tender, no masses, no organomegaly  Extremities: Extremities normal, atraumatic, no cyanosis or edema  Lymph Nodes:  Cervical, supraclavicular, and axillary nodes normal      Labs Results:  Reviewed in epic today  Lab Results   Component Value Date    WBC 4.46 01/23/2020    HGB 11.7 01/23/2020    HCT 36.8 01/23/2020    MCV 84.8 01/23/2020    PLT 149 01/23/2020     Lab Results   Component Value Date    CREAT 1.1 04/27/2020    BUN 16.0 04/27/2020    NA 138 04/27/2020    K 4.0 04/27/2020    CL 100 04/27/2020    CO2 29 04/27/2020     Lab Results   Component Value Date    ALT 14 04/27/2020    AST 18 04/27/2020    ALKPHOS 95 04/27/2020    BILITOTAL 0.5 04/27/2020       Imaging Results: reviewed bone scan 05/21/2020, mammogram 04/25/2020 and ECHO 06/14/2020       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, estrogen receptor negative  IDC of the left breast,clinical stage II, cT2, cN0, cMx,G3, ER 0%, PR 0%, Her 2 neu negative (1+) by IHC, Ki 67 80%, s/p neoadjuvant chemotherapywith  --- dd AC followed by weekly taxol/carbo: Doxorubicin 60 mg/m2 IV day 1 + Cytoxan 600 mg/m2 IV Day 1 q 14 days x  4 cycles with Neulasta 6 mg Day1with each cycle followed by paclitaxel 80 mg/m2 + Carbo  AUC 1.5weekly x 12 weeks.    C1D1 AC on 04/14/2019.   C4D1 AC on 06/09/2019.  Started carbotaxol on 06/23/2019. Was unable to tolerate.   Dropped carbo and continued with single agent paclitaxelstarting withC2D8. Patient decided to stop chemotherapy with once one infusion left (09/15/2019) as she developed severe neuropathic pain hands and feet.    Followed by cardio-oncology.  BaselineECHO2/23/2021 showed EF 66% and GLS -19%.  ECHOpost C#2 AC 3/17/2021showed EF 64% and GLS -19%.  Echo post C4 AC- 4/19/ 21 showed EF 64% GLS -19. %  ECHO post 12 m AC -06/14/2020 showed EF 65%. Not able to estimated GLS.   Procedure was very painful for patient and she does not want to repeat. No symptoms s/o CHF.     S/p left lumpectomy/SLNBx 10/14/2019. Final pathology showed ypT0, ypNo.   Completed radiation from 11/29/2019 to 01/09/2020.     RTC in 6 m       CIPN:   Improving. Continue observation.       [name], MD     Total time spent on encounter  including face to face interaction with patient and non face to face activities including preparing to see the patient (review imaging and pathology records), obtaining and reviewing the separately obtained history, ordering medication, test, procedure, referring the communicated with other healthcare professionals, documenting clinical information sent electronically, independently interpreting results and communicating results with patient, family, caregiver, care conversation on the date of service was equal to 30 minutes.

## 2020-07-20 NOTE — Telephone Encounter (Signed)
Rx EMLA cream (1 tube)     RTC 6 m for PE and labs (CBC and LFTs)

## 2020-07-20 NOTE — Telephone Encounter (Signed)
Lab orders placed.    Called patient and asked where she would like EMLA rx sent. Patient states she would like sent to her preferred pharmacy in [place]. Sent per MD request.

## 2020-07-23 ENCOUNTER — Encounter (INDEPENDENT_AMBULATORY_CARE_PROVIDER_SITE_OTHER): Payer: Self-pay | Admitting: Family Medicine

## 2020-07-31 ENCOUNTER — Other Ambulatory Visit: Payer: Self-pay

## 2020-07-31 ENCOUNTER — Ambulatory Visit: Payer: Managed Care, Other (non HMO) | Attending: Family | Admitting: Rehabilitation

## 2020-07-31 DIAGNOSIS — I890 Lymphedema, not elsewhere classified: Secondary | ICD-10-CM | POA: Insufficient documentation

## 2020-07-31 DIAGNOSIS — M25511 Pain in right shoulder: Secondary | ICD-10-CM | POA: Diagnosis present

## 2020-07-31 DIAGNOSIS — M25512 Pain in left shoulder: Secondary | ICD-10-CM | POA: Diagnosis present

## 2020-07-31 DIAGNOSIS — G8929 Other chronic pain: Secondary | ICD-10-CM | POA: Diagnosis present

## 2020-07-31 NOTE — Therapy (Signed)
[place], [place], [address] Phone: [phone]   Fax:  [phone]  Physical Therapy Treatment  Patient Details  Name: Olivia [name] MRN: [id number] Date of Birth: [birth year] Referring Provider (PT): Olin Hauser, FNP   Encounter Date: 07/31/2020   PT End of Session - 07/31/20 1746     Visit Number 3    Number of Visits 9    Date for PT Re-Evaluation 10/24/20    PT Start Time 7628    PT Stop Time 1359    PT Time Calculation (min) 54 min    Activity Tolerance Patient tolerated treatment well    Behavior During Therapy [facility] for tasks assessed/performed             Past Medical History:  Diagnosis Date   Breast cancer ([place])    Hypertension     Past Surgical History:  Procedure Laterality Date   LYMPHADENECTOMY      There were no vitals filed for this visit.   Subjective Assessment - 07/31/20 1310     Subjective nothing new with the shoulders.    Pertinent History Left breast cancer IDC stage 2 with lumpectomy and bilateral reduction and lift and 0/2 nodes removed on 10/14/19 in [place].  Radiation and chemotherapy completed. 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    Currently in Pain? No/denies                [facility] PT Assessment - 07/31/20 0001       AROM   Left Shoulder Flexion 102 Degrees    Left Shoulder ABduction 90 Degrees                           [facility] Adult PT Treatment/Exercise - 07/31/20 0001       Shoulder Exercises: Seated   Other Seated Exercises seated in chair: scapular elevation and depression, protraction and retraction, bil ER, W slides x 5 each, alternating flexion x 5 each      Shoulder Exercises: Standing   Flexion Left;5 reps    Flexion Limitations with ranger    ABduction Left    ABduction Limitations attempted with ranger but too painful      Shoulder Exercises: Pulleys   Flexion 2 minutes     Scaption 2 minutes      Manual Therapy   Manual Therapy Soft tissue mobilization;Manual Lymphatic Drainage (MLD);Taping    Manual therapy comments gave pt large foam rectangle for lateral bra, small chip pack for medial breast    Soft tissue mobilization to L lateral trunk to lateral edge of lats and serratus and periscapular muscles using cocoa butter and into lateral breast with some scar release    Manual Lymphatic Drainage (MLD) incorporated post STM to lateral breast and medial breast with hand over hand instruction for patient    Passive ROM to left shoulder in direction of flexion and abduction    Kinesiotex Facilitate Muscle      Kinesiotix   Facilitate Muscle  one Y strip with anchor on mid deltoid and one strip placed at supraspinatus and one strip around anterior shoulder towards scapula with instruction on removal                    PT Education - 07/31/20 1745     Education Details kinesiotape removal and use, how to use foam in bra    Person(s)  Educated Patient    Methods Explanation;Demonstration;Verbal cues                 PT Long Term Goals - 07/04/20 1209       PT LONG TERM GOAL #1   Title Pt will improve bil shoulder flexion to at least 150 to demonstrate improved mobility    Time 16    Period Weeks    Status New      PT LONG TERM GOAL #2   Title Pt will improve bil shoulder abduction to at least 140 to demonstrate improved mobility    Time 16    Period Weeks    Status New      PT LONG TERM GOAL #3   Title Pt will decrease shoulder pain to 0/10 at rest    Time 16    Period Weeks    Status New      PT LONG TERM GOAL #4   Title Pt will be ind with self MLD for the left breast lymphedema    Time 16    Period Weeks    Status New      PT LONG TERM GOAL #5   Title Pt will be ind with final HEP for continued mobility    Time 16    Period Weeks    Status New                   Plan - 07/31/20 1746     Clinical Impression  Statement Continued with left shoulder ROM focus with pt demonstrating more than 50% gain in flexion since evaluation despite still being low.  Discovered left breast scar tissue and tightness of lateral musculature today which may contribute to shoulder dysfuction.  Appled KT after tape trial during treatment.    PT Frequency Biweekly    PT Treatment/Interventions ADLs/Self Care Home Management;Therapeutic exercise;Patient/family education;Manual lymph drainage;Taping;Manual techniques;Passive range of motion;DME Instruction    PT Next Visit Plan how was tape and foams? start with breast MLD/STM lateral trunk and scar tissue with education to patient on self MLD for the breast and then shoulder AAROM as albe, retape if beneficial (scap mobs and STM to lats and serratus on L, advance TE as needed)    Consulted and Agree with Plan of Care Patient             Patient will benefit from skilled therapeutic intervention in order to improve the following deficits and impairments:     Visit Diagnosis: Chronic left shoulder pain  Chronic right shoulder pain  Lymphedema, not elsewhere classified     Problem List There are no problems to display for this patient.   Stark Bray 07/31/2020, 5:50 PM  [place], [place], [address] Phone: [phone]   Fax:  [phone]  Name: Raniya Golembeski MRN: [id number] Date of Birth: 1970-07-24

## 2020-08-14 ENCOUNTER — Other Ambulatory Visit: Payer: Self-pay

## 2020-08-14 ENCOUNTER — Encounter: Payer: Self-pay | Admitting: Rehabilitation

## 2020-08-14 ENCOUNTER — Ambulatory Visit: Payer: Managed Care, Other (non HMO) | Admitting: Rehabilitation

## 2020-08-14 DIAGNOSIS — M25512 Pain in left shoulder: Secondary | ICD-10-CM

## 2020-08-14 DIAGNOSIS — I890 Lymphedema, not elsewhere classified: Secondary | ICD-10-CM

## 2020-08-14 DIAGNOSIS — G8929 Other chronic pain: Secondary | ICD-10-CM

## 2020-08-14 NOTE — Addendum Note (Signed)
Addended by: Stark Bray on: 08/14/2020 03:14 PM   Modules accepted: Orders

## 2020-08-14 NOTE — Therapy (Signed)
[place], [place], [address] Phone: [phone]   Fax:  [phone]  Physical Therapy Treatment  Patient Details  Name: Olivia [name] MRN: [id number] Date of Birth: 06-30-1970 Referring Provider (PT): Olin Hauser, FNP   Encounter Date: 08/14/2020   PT End of Session - 08/14/20 1409     Visit Number 4    Number of Visits 9    Date for PT Re-Evaluation 10/24/20    PT Start Time 1304    PT Stop Time 1359    PT Time Calculation (min) 55 min    Activity Tolerance Patient tolerated treatment well    Behavior During Therapy [facility] for tasks assessed/performed             Past Medical History:  Diagnosis Date   Breast cancer ([place])    Hypertension     Past Surgical History:  Procedure Laterality Date   LYMPHADENECTOMY      There were no vitals filed for this visit.   Subjective Assessment - 08/14/20 1305     Subjective I am using the foam and they seem to help.  The tape didn't seem to help much maybe helped with the posture. I had to do muscle relaxer and tylenold for a crick in my neck last week.    Pertinent History Left breast cancer IDC stage 2 with lumpectomy and bilateral reduction and lift and 0/2 nodes removed on 10/14/19 in [place].  Radiation and chemotherapy completed. 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    Currently in Pain? No/denies                [facility] PT Assessment - 08/14/20 0001       AROM   Right Shoulder Flexion 150 Degrees    Right Shoulder ABduction 140 Degrees   pain   Left Shoulder Flexion 100 Degrees    Left Shoulder ABduction 85 Degrees                           [facility] Adult PT Treatment/Exercise - 08/14/20 0001       Exercises   Other Exercises  reviewed self neck stretch UT and LS x 20" each      Shoulder Exercises: Seated   Other Seated Exercises seated in chair: scapular elevation and depression,  protraction and retraction, bil ER, W slides x 10 each, alternating flexion x 10 each      Shoulder Exercises: Standing   Other Standing Exercises shoulder isometrics flexion, ER, IR 6" x 5      Shoulder Exercises: Pulleys   Flexion 2 minutes    Scaption Other (comment)   unable to do due to pinch     Manual Therapy   Soft tissue mobilization to L lateral trunk to lateral edge of lats and serratus and periscapular muscles using cocoa butter and into lateral breast with some scar release    Scapular Mobilization in R sidelying to L scapua in direction of retraction and protraction with pt extrememly limited with scapular mobility but improved greatly - pt had increased comfort with ROM following scapular mobilization    Manual Lymphatic Drainage (MLD) incorporated post STM to lateral breast and medial breast with hand over hand instruction for patient    Passive ROM to left shoulder in direction of flexion and abduction  PT Long Term Goals - 07/04/20 1209       PT LONG TERM GOAL #1   Title Pt will improve bil shoulder flexion to at least 150 to demonstrate improved mobility    Time 16    Period Weeks    Status New      PT LONG TERM GOAL #2   Title Pt will improve bil shoulder abduction to at least 140 to demonstrate improved mobility    Time 16    Period Weeks    Status New      PT LONG TERM GOAL #3   Title Pt will decrease shoulder pain to 0/10 at rest    Time 16    Period Weeks    Status New      PT LONG TERM GOAL #4   Title Pt will be ind with self MLD for the left breast lymphedema    Time 16    Period Weeks    Status New      PT LONG TERM GOAL #5   Title Pt will be ind with final HEP for continued mobility    Time 16    Period Weeks    Status New                   Plan - 08/14/20 1410     Clinical Impression Statement Rt UE continues to make improvements but the Lt shoulder has plateaued into flexion and  abduction still with impingement type pain. Continues with subacromial bursa region tenderness.  Added ionto to POC today.  Also continues with left breast fibrosis and increased scar tissue.    PT Frequency Biweekly    PT Treatment/Interventions ADLs/Self Care Home Management;Therapeutic exercise;Patient/family education;Manual lymph drainage;Taping;Manual techniques;Passive range of motion;DME Instruction;Iontophoresis 4mg /ml Dexamethasone    PT Next Visit Plan ionto back for shoulder? continue bil shoulder PROM/AAROM, postural TE - will be seen by ortho if no improvements    Consulted and Agree with Plan of Care Patient             Patient will benefit from skilled therapeutic intervention in order to improve the following deficits and impairments:     Visit Diagnosis: Chronic left shoulder pain  Chronic right shoulder pain  Lymphedema, not elsewhere classified     Problem List There are no problems to display for this patient.   Stark Bray 08/14/2020, 2:58 PM  [place], [place], [address] Phone: [phone]   Fax:  [phone]  Name: Janki Dike MRN: [id number] Date of Birth: 07-Jan-1971

## 2020-08-16 NOTE — Addendum Note (Signed)
Addended by: Stark Bray on: 08/16/2020 03:19 PM   Modules accepted: Orders

## 2020-08-16 NOTE — Addendum Note (Signed)
Addended by: Shan Levans R on: 08/16/2020 10:24 AM   Modules accepted: Orders

## 2020-08-28 ENCOUNTER — Ambulatory Visit: Payer: Managed Care, Other (non HMO) | Attending: Family | Admitting: Physical Therapy

## 2020-08-28 ENCOUNTER — Other Ambulatory Visit: Payer: Self-pay

## 2020-08-28 ENCOUNTER — Encounter: Payer: Self-pay | Admitting: Physical Therapy

## 2020-08-28 DIAGNOSIS — G8929 Other chronic pain: Secondary | ICD-10-CM

## 2020-08-28 DIAGNOSIS — M25512 Pain in left shoulder: Secondary | ICD-10-CM | POA: Insufficient documentation

## 2020-08-28 DIAGNOSIS — M25511 Pain in right shoulder: Secondary | ICD-10-CM | POA: Insufficient documentation

## 2020-08-28 DIAGNOSIS — I890 Lymphedema, not elsewhere classified: Secondary | ICD-10-CM | POA: Diagnosis present

## 2020-08-28 NOTE — Patient Instructions (Signed)
Self manual lymph drainage: Perform this sequence once a day.  Only give enough pressure no your skin to make the skin move.  Diaphragmatic - Supine   Inhale through nose making navel move out toward hands. Exhale through puckered lips, hands follow navel in. Repeat _5__ times. Rest _10__ seconds between repeats.   Copyright  VHI. All rights reserved.  Hug yourself.  Do circles at your neck just above your collarbones.  Repeat this 10 times.  Axilla - One at a Time   Using full weight of flat hand and fingers at center of uninvolved armpit, make _10__ in-place circles.   Copyright  VHI. All rights reserved.  LEG: Inguinal Nodes Stimulation   With small finger side of hand against hip crease on involved side, gently perform circles at the crease. Repeat __10_ times.   Copyright  VHI. All rights reserved.  Axilla to Inguinal Nodes - Sweep   On involved side, sweep _4__ times from armpit along side of trunk to hip crease.  Now gently stretch skin from the involved side to the uninvolved side across the chest at the shoulder line.  Repeat that 4 times.  Draw an imaginary diagonal line from upper outer breast through the nipple area toward lower inner breast.  Direct fluid upward and inward from this line toward the pathway across your upper chest .  Do this in three rows to treat all of the upper inner breast tissue, and do each row 3-4x.      Direct fluid to treat all of lower outer breast tissue downward and outward toward pathway that is aimed at the left groin.  Finish by doing the pathways as described above going from your involved armpit to the same side groin and going across your upper chest from the involved shoulder to the uninvolved shoulder.  Repeat the steps above where you do circles in your left groin and right armpit. Copyright  VHI. All rights reserved.   

## 2020-08-28 NOTE — Therapy (Signed)
[place], [place], [address] Phone: [phone]   Fax:  [phone]  Physical Therapy Treatment  Patient Details  Name: Nery Kalisz MRN: [id number] Date of Birth: Oct 12, 1970 Referring Provider (PT): Olin Hauser, FNP   Encounter Date: 08/28/2020   PT End of Session - 08/28/20 1355     Visit Number 5    Number of Visits 9    Date for PT Re-Evaluation 10/24/20    PT Start Time 1304    PT Stop Time 1353    PT Time Calculation (min) 49 min    Activity Tolerance Patient tolerated treatment well    Behavior During Therapy [facility] for tasks assessed/performed             Past Medical History:  Diagnosis Date   Breast cancer ([place])    Hypertension     Past Surgical History:  Procedure Laterality Date   LYMPHADENECTOMY      There were no vitals filed for this visit.   Subjective Assessment - 08/28/20 1305     Subjective I think my shoulder is feeling a little better. I feel more relaxed and less tense.    Pertinent History Left breast cancer IDC stage 2 with lumpectomy and bilateral reduction and lift and 0/2 nodes removed on 10/14/19 in [place].  Radiation and chemotherapy completed. 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 No/denies    Pain Score 0-No pain                               [facility] Adult PT Treatment/Exercise - 08/28/20 0001       Shoulder Exercises: Standing   Other Standing Exercises shoulder isometrics flexion, ER, IR 6" x 5      Shoulder Exercises: Pulleys   Flexion 2 minutes    Scaption 2 minutes      Manual Therapy   Soft tissue mobilization to L lateral trunk to lateral edge of lats and serratus and periscapular muscles using cocoa butter and into posterior upper arm where pt has increased tenderness    Manual Lymphatic Drainage (MLD) instructed pt throughout  and had her return demonstrate: in supine: short neck, 5 diaphramatic breaths, right axillary nodes and establishment of interaxillary pathway, left inguinal nodes and establishment of axillo inguinal pathway, L breast moving fluid towards pathways then retracing all steps    Passive ROM to left shoulder in direction of flexion and abduction stopped due to increased pain                         PT Long Term Goals - 07/04/20 1209       PT LONG TERM GOAL #1   Title Pt will improve bil shoulder flexion to at least 150 to demonstrate improved mobility    Time 16    Period Weeks    Status New      PT LONG TERM GOAL #2   Title Pt will improve bil shoulder abduction to at least 140 to demonstrate improved mobility    Time 16    Period Weeks    Status New      PT LONG TERM GOAL #3   Title Pt will decrease shoulder pain to 0/10 at rest    Time 16  Period Weeks    Status New      PT LONG TERM GOAL #4   Title Pt will be ind with self MLD for the left breast lymphedema    Time 16    Period Weeks    Status New      PT LONG TERM GOAL #5   Title Pt will be ind with final HEP for continued mobility    Time 16    Period Weeks    Status New                   Plan - 08/28/20 1356     Clinical Impression Statement Continued with AAROM exercises today. Pt still has increased pain with left shoulder abduction and flexion which limits ROM. Pt with numerous areas of muscle tightness in periscapular muscles today that improved greatly with soft tissue mobilization. Pt's medial breast demonstrates increased fibrosis so continued to instruct pt in self MLD technique and issued handout for pt to practice at home.    PT Frequency Biweekly    PT Duration --   15 weeks   PT Treatment/Interventions ADLs/Self Care Home Management;Therapeutic exercise;Patient/family education;Manual lymph drainage;Taping;Manual techniques;Passive range of motion;DME Instruction;Iontophoresis  4mg /ml Dexamethasone    PT Next Visit Plan ionto back for shoulder? continue bil shoulder PROM/AAROM, postural TE - will be seen by ortho if no improvements    PT Home Exercise Plan Access Code: [email or web address]    Consulted and Agree with Plan of Care Patient             Patient will benefit from skilled therapeutic intervention in order to improve the following deficits and impairments:  Decreased skin integrity, Increased edema, Decreased knowledge of precautions, Decreased range of motion, Decreased strength, Impaired UE functional use, Postural dysfunction, Pain  Visit Diagnosis: Chronic left shoulder pain  Chronic right shoulder pain  Lymphedema, not elsewhere classified     Problem List There are no problems to display for this patient.   Allyson Sabal [facility] 08/28/2020, 1:58 PM  [place], [place], [address] Phone: [phone]   Fax:  [phone]  Name: Lyndzie Zentz MRN: [id number] Date of Birth: Dec 26, 1970  Manus Gunning, PT 08/28/20 1:58 PM

## 2020-09-02 ENCOUNTER — Other Ambulatory Visit (INDEPENDENT_AMBULATORY_CARE_PROVIDER_SITE_OTHER): Payer: Self-pay | Admitting: Family Medicine

## 2020-09-02 DIAGNOSIS — I10 Essential (primary) hypertension: Secondary | ICD-10-CM

## 2020-09-03 ENCOUNTER — Other Ambulatory Visit (INDEPENDENT_AMBULATORY_CARE_PROVIDER_SITE_OTHER): Payer: Self-pay | Admitting: Family Medicine

## 2020-09-03 ENCOUNTER — Encounter (INDEPENDENT_AMBULATORY_CARE_PROVIDER_SITE_OTHER): Payer: Self-pay | Admitting: Family Medicine

## 2020-09-03 DIAGNOSIS — I10 Essential (primary) hypertension: Secondary | ICD-10-CM

## 2020-09-03 NOTE — Telephone Encounter (Signed)
Duplicate request

## 2020-09-03 NOTE — Telephone Encounter (Signed)
Last visit for medication follow up was 07/20/20  Note states patient is due for follow up   Temp refill sent in with note to follow up

## 2020-09-10 ENCOUNTER — Ambulatory Visit: Payer: Commercial Managed Care - POS | Admitting: Internal Medicine

## 2020-09-10 ENCOUNTER — Encounter (HOSPITAL_BASED_OUTPATIENT_CLINIC_OR_DEPARTMENT_OTHER): Payer: Self-pay | Admitting: Family Nurse Practitioner

## 2020-09-19 ENCOUNTER — Encounter: Payer: Self-pay | Admitting: Physical Therapy

## 2020-09-19 ENCOUNTER — Ambulatory Visit: Payer: Managed Care, Other (non HMO) | Attending: Family | Admitting: Physical Therapy

## 2020-09-19 ENCOUNTER — Other Ambulatory Visit: Payer: Self-pay

## 2020-09-19 DIAGNOSIS — G8929 Other chronic pain: Secondary | ICD-10-CM | POA: Diagnosis present

## 2020-09-19 DIAGNOSIS — M25511 Pain in right shoulder: Secondary | ICD-10-CM | POA: Insufficient documentation

## 2020-09-19 DIAGNOSIS — M25512 Pain in left shoulder: Secondary | ICD-10-CM | POA: Diagnosis present

## 2020-09-19 DIAGNOSIS — I890 Lymphedema, not elsewhere classified: Secondary | ICD-10-CM | POA: Diagnosis present

## 2020-09-19 NOTE — Therapy (Signed)
[place], [place], [address] Phone: [phone]   Fax:  [phone]  Physical Therapy Treatment  Patient Details  Name: Olivia [name] MRN: [id number] Date of Birth: 11/20/1970 Referring Provider (PT): Olin Hauser, FNP   Encounter Date: 09/19/2020   PT End of Session - 09/19/20 1457     Visit Number 6    Number of Visits 11    Date for PT Re-Evaluation 10/24/20    PT Start Time [id number]    PT Stop Time 1457    PT Time Calculation (min) 53 min    Activity Tolerance Patient tolerated treatment well    Behavior During Therapy [facility] for tasks assessed/performed             Past Medical History:  Diagnosis Date   Breast cancer ([place])    Hypertension     Past Surgical History:  Procedure Laterality Date   LYMPHADENECTOMY      There were no vitals filed for this visit.   Subjective Assessment - 09/19/20 1408     Subjective My arm has started swelling and I have pain along the vein. I do the massage and my breast gets softer but then it gets hard when I start moving around. I have not been using my chip pack.    Pertinent History Left breast cancer IDC stage 2 with lumpectomy and bilateral reduction and lift and 0/2 nodes removed on 10/14/19 in [place].  Radiation and chemotherapy completed. 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 No/denies    Pain Score 0-No pain                [facility] PT Assessment - 09/19/20 0001       AROM   Left Shoulder Flexion 103 Degrees    Left Shoulder ABduction 80 Degrees               LYMPHEDEMA/ONCOLOGY QUESTIONNAIRE - 09/19/20 0001       Left Upper Extremity Lymphedema   15 cm Proximal to Olecranon Process 40.7 cm    10 cm Proximal to Olecranon Process 38.5 cm    Olecranon Process 32 cm    15 cm Proximal to Ulnar Styloid Process 30.2 cm    10 cm Proximal to  Ulnar Styloid Process 26.5 cm    Just Proximal to Ulnar Styloid Process 20 cm    Across Hand at [employer] 22.2 cm    At [place] of 2nd Digit 6.4 cm                        [facility] Adult PT Treatment/Exercise - 09/19/20 0001       Modalities   Modalities Iontophoresis      Iontophoresis   Type of Iontophoresis Dexamethasone    Location L upper arm    Dose '4mg'$ /mL    Time 4-6 hrs      Manual Therapy   Soft tissue mobilization to L lateral trunk to lateral edge of lats and serratus and periscapular muscles with numerous areas of tightness noted- ROM improved following STM    Passive ROM to left shoulder in direction of flexion and abduction stopped due to increased pain                         PT Long  Term Goals - 09/19/20 1505       PT LONG TERM GOAL #1   Title Pt will improve bil shoulder flexion to at least 150 to demonstrate improved mobility    Baseline 09/19/20-L shoulder flexion 103, abd 80    Time 16    Period Weeks    Status On-going      PT LONG TERM GOAL #2   Title Pt will improve bil shoulder abduction to at least 140 to demonstrate improved mobility    Baseline 09/19/20- L sh abd 80      PT LONG TERM GOAL #3   Title Pt will decrease shoulder pain to 0/10 at rest    Time 16    Period Weeks    Status On-going      PT LONG TERM GOAL #4   Title Pt will be ind with self MLD for the left breast lymphedema    Baseline 09/19/20- pt has been instructed and given handout    Time 16    Period Weeks    Status On-going      PT LONG TERM GOAL #5   Title Pt will be ind with final HEP for continued mobility    Time 16    Period Weeks    Status On-going                   Plan - 09/19/20 1457     Clinical Impression Statement Pt returns to PT with continued limited L shoulder ROM and pain in her L upper arm with shoulder movements. Pt had a recent onset of L UE swelling mainly at her L hand and wrist. She reports there is a vein in  the area that is sore to the touch. Educated pt to follow up with oncologist or primary care regarding recent onset of pain and swelling. She still has an area of extremely tight musculature just inferior to her axilla including her serratus. This area releases with soft tissue mobilization but then tightens up again. She has her boyfriend massage it for her every other day and reports increased L shoulder ROM following massage but then it tightens up. Educated pt about tendonitits, vs tight musculature and the pain and guarding cycle. Educated pt that she may benefit from seeing an orthopedic doctor since she has not had much improvement. Educated pt that increasing PT to once a week instead of every other week would help. Began ionto today on L upper arm. Pt agreeable to once a week for the next 5 weeks. Pt would benefit from continued skilled PT services to decrease pain and improve L shoulder ROM.    PT Frequency 1x / week    PT Duration --   5 weeks   PT Treatment/Interventions ADLs/Self Care Home Management;Therapeutic exercise;Patient/family education;Manual lymph drainage;Taping;Manual techniques;Passive range of motion;DME Instruction;Iontophoresis '4mg'$ /ml Dexamethasone    PT Next Visit Plan ionto to L upper arm- how was it? continue bil shoulder PROM/AAROM, postural TE - will be seen by ortho if no improvements    Consulted and Agree with Plan of Care Patient             Patient will benefit from skilled therapeutic intervention in order to improve the following deficits and impairments:  Decreased skin integrity, Increased edema, Decreased knowledge of precautions, Decreased range of motion, Decreased strength, Impaired UE functional use, Postural dysfunction, Pain  Visit Diagnosis: Chronic left shoulder pain  Chronic right shoulder pain  Lymphedema, not elsewhere classified  Problem List There are no problems to display for this patient.   Allyson Sabal [facility] 09/19/2020,  5:06 PM  [place], [place], [address] Phone: [phone]   Fax:  [phone]  Name: Olivia [name] MRN: [id number] Date of Birth: 1970/10/01   Manus Gunning, PT 09/19/20 5:06 PM

## 2020-10-03 ENCOUNTER — Ambulatory Visit: Payer: Managed Care, Other (non HMO) | Admitting: Physical Therapy

## 2020-10-03 ENCOUNTER — Encounter: Payer: Self-pay | Admitting: Physical Therapy

## 2020-10-03 ENCOUNTER — Other Ambulatory Visit: Payer: Self-pay

## 2020-10-03 DIAGNOSIS — I890 Lymphedema, not elsewhere classified: Secondary | ICD-10-CM

## 2020-10-03 DIAGNOSIS — M25512 Pain in left shoulder: Secondary | ICD-10-CM | POA: Diagnosis not present

## 2020-10-03 DIAGNOSIS — G8929 Other chronic pain: Secondary | ICD-10-CM

## 2020-10-03 NOTE — Therapy (Signed)
[place], [place], [address] Phone: [phone]   Fax:  [phone]  Physical Therapy Treatment  Patient Details  Name: Olivia [name] MRN: [id number] Date of Birth: 16-Apr-1970 Referring Provider (PT): Olin Hauser, FNP   Encounter Date: 10/03/2020   PT End of Session - 10/03/20 1454     Visit Number 7    Number of Visits 11    Date for PT Re-Evaluation 10/24/20    PT Start Time 1404    PT Stop Time 1454    PT Time Calculation (min) 50 min    Activity Tolerance Patient tolerated treatment well    Behavior During Therapy [facility] for tasks assessed/performed             Past Medical History:  Diagnosis Date   Breast cancer ([place])    Hypertension     Past Surgical History:  Procedure Laterality Date   LYMPHADENECTOMY      There were no vitals filed for this visit.   Subjective Assessment - 10/03/20 1404     Subjective I think I am going to have to go to the orthopedic doctor. I am starting to feel the discomfort more often. I could not really tell a difference with the ionto patch.    Pertinent History Left breast cancer IDC stage 2 with lumpectomy and bilateral reduction and lift and 0/2 nodes removed on 10/14/19 in [place].  Radiation and chemotherapy completed. 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 No/denies    Pain Score 0-No pain                [facility] PT Assessment - 10/03/20 0001       AROM   Left Shoulder Flexion 110 Degrees    Left Shoulder ABduction 76 Degrees                           [facility] Adult PT Treatment/Exercise - 10/03/20 0001       Manual Therapy   Soft tissue mobilization to L lateral trunk to lateral edge of lats and serratus and periscapular muscles with numerous areas of tightness noted                    PT Education - 10/03/20 1502      Education Details how to manage breast lymphedema, lymphedema is manageable but not curable, need for compression and daily MLD, compression pump    Person(s) Educated Patient    Methods Explanation;Handout    Comprehension Verbalized understanding                 PT Long Term Goals - 09/19/20 1505       PT LONG TERM GOAL #1   Title Pt will improve bil shoulder flexion to at least 150 to demonstrate improved mobility    Baseline 09/19/20-L shoulder flexion 103, abd 80    Time 16    Period Weeks    Status On-going      PT LONG TERM GOAL #2   Title Pt will improve bil shoulder abduction to at least 140 to demonstrate improved mobility    Baseline 09/19/20- L sh abd 80      PT LONG TERM GOAL #3   Title Pt will decrease shoulder pain to 0/10 at rest    Time 16  Period Weeks    Status On-going      PT LONG TERM GOAL #4   Title Pt will be ind with self MLD for the left breast lymphedema    Baseline 09/19/20- pt has been instructed and given handout    Time 16    Period Weeks    Status On-going      PT LONG TERM GOAL #5   Title Pt will be ind with final HEP for continued mobility    Time 16    Period Weeks    Status On-going                   Plan - 10/03/20 1459     Clinical Impression Statement Spent time discussing with pt lack of progress towards shoulder goals in therapy and how at this point it is important to see an orthopedic doctor to have her shoulder examined. She did not feel the ionto was helpful and did not want it applied again today. Answered pt's questions regarding breast lymphedema and management techniques. Issued info on FlexiTouch compression pump and pt wants to see if her insurance will cover the pump. Continued with soft tissue mobilization to decrease tightness in left lateral trunk and periscapular musculature. Will begin to focus more on STM and breast lymphedema until pt is evaluated by an ortho dr.    PT Frequency 1x / week    PT  Duration --   5 weeks   PT Treatment/Interventions ADLs/Self Care Home Management;Therapeutic exercise;Patient/family education;Manual lymph drainage;Taping;Manual techniques;Passive range of motion;DME Instruction;Iontophoresis '4mg'$ /ml Dexamethasone    PT Next Visit Plan see if pt got appt with ortho dr, L br lymphedema, STM to L shoulder and trunk musculature    PT Home Exercise Plan Access Code: [email or web address]    Consulted and Agree with Plan of Care Patient             Patient will benefit from skilled therapeutic intervention in order to improve the following deficits and impairments:  Decreased skin integrity, Increased edema, Decreased knowledge of precautions, Decreased range of motion, Decreased strength, Impaired UE functional use, Postural dysfunction, Pain  Visit Diagnosis: Chronic left shoulder pain  Chronic right shoulder pain  Lymphedema, not elsewhere classified     Problem List There are no problems to display for this patient.   Allyson Sabal [facility] 10/03/2020, 3:03 PM  [facility] [place], [place], [address] Phone: [phone]   Fax:  [phone]  Name: Olivia [name] MRN: [id number] Date of Birth: 01-22-1971  Manus Gunning, PT 10/03/20 3:03 PM

## 2020-10-08 ENCOUNTER — Other Ambulatory Visit: Payer: Self-pay

## 2020-10-08 ENCOUNTER — Ambulatory Visit (INDEPENDENT_AMBULATORY_CARE_PROVIDER_SITE_OTHER): Payer: Managed Care, Other (non HMO)

## 2020-10-08 ENCOUNTER — Ambulatory Visit (INDEPENDENT_AMBULATORY_CARE_PROVIDER_SITE_OTHER): Payer: Managed Care, Other (non HMO) | Admitting: Family Medicine

## 2020-10-08 ENCOUNTER — Ambulatory Visit: Payer: Self-pay

## 2020-10-08 ENCOUNTER — Encounter: Payer: Self-pay | Admitting: Family Medicine

## 2020-10-08 VITALS — BP 126/78 | HR 85 | Ht 69.0 in | Wt 283.0 lb

## 2020-10-08 DIAGNOSIS — M75102 Unspecified rotator cuff tear or rupture of left shoulder, not specified as traumatic: Secondary | ICD-10-CM | POA: Insufficient documentation

## 2020-10-08 DIAGNOSIS — M75112 Incomplete rotator cuff tear or rupture of left shoulder, not specified as traumatic: Secondary | ICD-10-CM

## 2020-10-08 DIAGNOSIS — M25512 Pain in left shoulder: Secondary | ICD-10-CM | POA: Diagnosis not present

## 2020-10-08 DIAGNOSIS — M25511 Pain in right shoulder: Secondary | ICD-10-CM

## 2020-10-08 DIAGNOSIS — G8929 Other chronic pain: Secondary | ICD-10-CM

## 2020-10-08 NOTE — Patient Instructions (Addendum)
Good to see you  Please get Xrays today before you leave   MRI L shoulder  Here is a handout about PRP injections.  Will follow up via MyChart after the MRI

## 2020-10-08 NOTE — Assessment & Plan Note (Signed)
Patient does have what appears to be a fairly large tear of the supraspinatus noted.  Patient does have weakness.  States it has been going on a year nearly as long as patient did have the breast reduction surgery.  Does also have a history of breast cancer.  Discussed with patient about this.  Patient is having so much weakness and affecting daily activities that I do feel that advanced imaging is warranted. X-rays are pending at this point but likely will be normal with this.  With the amount of weakness if patient does have a large tear she may be a candidate for surgical intervention.  Patient otherwise may be a candidate also for potential injections.
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
## 2020-10-10 ENCOUNTER — Other Ambulatory Visit: Payer: Self-pay

## 2020-10-10 ENCOUNTER — Encounter: Payer: Self-pay | Admitting: Physical Therapy

## 2020-10-10 ENCOUNTER — Ambulatory Visit: Payer: Managed Care, Other (non HMO) | Admitting: Physical Therapy

## 2020-10-10 DIAGNOSIS — G8929 Other chronic pain: Secondary | ICD-10-CM

## 2020-10-10 DIAGNOSIS — M25512 Pain in left shoulder: Secondary | ICD-10-CM

## 2020-10-10 DIAGNOSIS — I890 Lymphedema, not elsewhere classified: Secondary | ICD-10-CM

## 2020-10-10 NOTE — Therapy (Signed)
[place], [place], [address] Phone: [phone]   Fax:  [phone]  Physical Therapy Treatment  Patient Details  Name: Olivia [name] MRN: [id number] Date of Birth: 1970-05-02 Referring Provider (PT): Olin Hauser, FNP   Encounter Date: 10/10/2020   PT End of Session - 10/10/20 1354     Visit Number 8    Number of Visits 11    Date for PT Re-Evaluation 10/24/20    PT Start Time [id number]    PT Stop Time 1350    PT Time Calculation (min) 45 min    Activity Tolerance Patient tolerated treatment well    Behavior During Therapy [facility] for tasks assessed/performed             Past Medical History:  Diagnosis Date   Breast cancer ([place])    Hypertension     Past Surgical History:  Procedure Laterality Date   LYMPHADENECTOMY      There were no vitals filed for this visit.   Subjective Assessment - 10/10/20 1306     Subjective I saw the ortho doctor and he is suspecting a rotator cuff tear. I have to get an MRI done. They are waiting on my authorization.    Pertinent History Left breast cancer IDC stage 2 with lumpectomy and bilateral reduction and lift and 0/2 nodes removed on 10/14/19 in [place].  Radiation and chemotherapy completed. 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 No/denies    Pain Score 0-No pain                               [facility] Adult PT Treatment/Exercise - 10/10/20 0001       Manual Therapy   Manual Lymphatic Drainage (MLD) short neck, 5 diaphragmatic breaths, right axillary nodes and establishment of interaxillary pathway, left inguinal nodes and establishment of axillo inguinal pathway, L breast moving fluid towards pathways with increased time spent at medial inferior breast in area of fibrosis then retracing all steps                         PT Long  Term Goals - 09/19/20 1505       PT LONG TERM GOAL #1   Title Pt will improve bil shoulder flexion to at least 150 to demonstrate improved mobility    Baseline 09/19/20-L shoulder flexion 103, abd 80    Time 16    Period Weeks    Status On-going      PT LONG TERM GOAL #2   Title Pt will improve bil shoulder abduction to at least 140 to demonstrate improved mobility    Baseline 09/19/20- L sh abd 80      PT LONG TERM GOAL #3   Title Pt will decrease shoulder pain to 0/10 at rest    Time 16    Period Weeks    Status On-going      PT LONG TERM GOAL #4   Title Pt will be ind with self MLD for the left breast lymphedema    Baseline 09/19/20- pt has been instructed and given handout    Time 16    Period Weeks    Status On-going      PT LONG TERM GOAL #5   Title Pt will be ind  with final HEP for continued mobility    Time 16    Period Weeks    Status On-going                   Plan - 10/10/20 1355     Clinical Impression Statement Pt recently saw the orthopedic doctor who suspects she may have a rotator cuff tear. She is going to have an MRI done soon to assess. For now focus is on L breast lymphedema. She has increased fibrosis in medial inferior breast that softened by end of treatment session today. Educated pt to wear foam chip pack in her bra over this area to help keep fibrosis from returning. Next session will educate pt on solaris swell spot for the breast as a a more permanent solution.    PT Frequency 1x / week    PT Duration --   5 weeks   PT Treatment/Interventions ADLs/Self Care Home Management;Therapeutic exercise;Patient/family education;Manual lymph drainage;Taping;Manual techniques;Passive range of motion;DME Instruction;Iontophoresis '4mg'$ /ml Dexamethasone    PT Next Visit Plan show pt breast swell spot, MRI results? L br lymphedema, STM to L shoulder and trunk musculature    Consulted and Agree with Plan of Care Patient             Patient will benefit  from skilled therapeutic intervention in order to improve the following deficits and impairments:  Decreased skin integrity, Increased edema, Decreased knowledge of precautions, Decreased range of motion, Decreased strength, Impaired UE functional use, Postural dysfunction, Pain  Visit Diagnosis: Lymphedema, not elsewhere classified  Chronic left shoulder pain     Problem List Patient Active Problem List   Diagnosis Date Noted   Left rotator cuff tear 10/08/2020    Allyson Sabal [facility] 10/10/2020, 1:58 PM  [place], [place], [address] Phone: [phone]   Fax:  [phone]  Name: Olivia [name] MRN: [id number] Date of Birth: 05-Feb-1971  Manus Gunning, PT 10/10/20 1:58 PM

## 2020-10-11 ENCOUNTER — Encounter: Payer: Self-pay | Admitting: Family Medicine

## 2020-10-12 ENCOUNTER — Encounter: Payer: Self-pay | Admitting: Internal Medicine

## 2020-10-16 ENCOUNTER — Encounter: Payer: Self-pay | Admitting: Physical Therapy

## 2020-10-16 ENCOUNTER — Other Ambulatory Visit: Payer: Self-pay

## 2020-10-16 ENCOUNTER — Ambulatory Visit: Payer: Managed Care, Other (non HMO) | Admitting: Physical Therapy

## 2020-10-16 DIAGNOSIS — G8929 Other chronic pain: Secondary | ICD-10-CM

## 2020-10-16 DIAGNOSIS — M25512 Pain in left shoulder: Secondary | ICD-10-CM | POA: Diagnosis not present

## 2020-10-16 DIAGNOSIS — I890 Lymphedema, not elsewhere classified: Secondary | ICD-10-CM

## 2020-10-16 NOTE — Therapy (Addendum)
[place], [place], [address] Phone: [phone]   Fax:  [phone]  Physical Therapy Treatment  Patient Details  Name: Olivia [name] MRN: [id number] Date of Birth: 1970-03-09 Referring Provider (PT): Olin Hauser, FNP   Encounter Date: 10/16/2020   PT End of Session - 10/16/20 1346     Visit Number 9    Number of Visits 11    Date for PT Re-Evaluation 10/24/20    PT Start Time [id number]    PT Stop Time 1346   pt requested to end session a little early   PT Time Calculation (min) 41 min    Activity Tolerance Patient tolerated treatment well    Behavior During Therapy [facility] for tasks assessed/performed             Past Medical History:  Diagnosis Date   Breast cancer ([place])    Hypertension     Past Surgical History:  Procedure Laterality Date   LYMPHADENECTOMY      There were no vitals filed for this visit.   Subjective Assessment - 10/16/20 1306     Subjective The breast felt really good after the pump trial. The basic pump was painful and it was not working on the right places.    Pertinent History Left breast cancer IDC stage 2 with lumpectomy and bilateral reduction and lift and 0/2 nodes removed on 10/14/19 in [place].  Radiation and chemotherapy completed. 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 Yes    Pain Score 4     Pain Location Breast    Pain Orientation Left;Lower    Pain Descriptors / Indicators Shooting    Pain Type Acute pain    Pain Radiating Towards down arm    Pain Onset In the past 7 days    Pain Frequency Intermittent    Aggravating Factors  getting up too fast    Pain Relieving Factors being caution    Effect of Pain on Daily Activities has to change position slowly                               [facility] Adult PT Treatment/Exercise - 10/16/20 0001        Manual Therapy   Manual Therapy Edema management;Manual Lymphatic Drainage (MLD)    Edema Management showed pt swell spot for breast and how to obtain    Manual Lymphatic Drainage (MLD) short neck, 5 diaphragmatic breaths, right axillary nodes and establishment of interaxillary pathway, left inguinal nodes and establishment of axillo inguinal pathway, L breast moving fluid towards pathways with increased time spent at medial inferior breast in area of fibrosis then retracing all steps                         PT Long Term Goals - 09/19/20 1505       PT LONG TERM GOAL #1   Title Pt will improve bil shoulder flexion to at least 150 to demonstrate improved mobility    Baseline 09/19/20-L shoulder flexion 103, abd 80    Time 16    Period Weeks    Status On-going      PT LONG TERM GOAL #2   Title Pt will improve bil shoulder abduction to at least 140 to demonstrate improved mobility  Baseline 09/19/20- L sh abd 80      PT LONG TERM GOAL #3   Title Pt will decrease shoulder pain to 0/10 at rest    Time 16    Period Weeks    Status On-going      PT LONG TERM GOAL #4   Title Pt will be ind with self MLD for the left breast lymphedema    Baseline 09/19/20- pt has been instructed and given handout    Time 16    Period Weeks    Status On-going      PT LONG TERM GOAL #5   Title Pt will be ind with final HEP for continued mobility    Time 16    Period Weeks    Status On-going                   Plan - 10/16/20 1352     Clinical Impression Statement Pt will be having an MRI on Saturday for her shoulder. She reports she is unable to sleep through the night due to the pain. Today focused on L breast lymphedema. Continued MLD with focus at medial breast to decrease fibrosis. Pt would really benefit from a FlexiTouch compression pump because pt already tried greater than 4 weeks of self MLD, elevation and exercises as well as consistently wearing her compression bra and  she continues to demonstrate swelling and fibrosis of L breast. She tried a basic pump but had to end the trial due to increased pain and feeling it was not helping her breast. Educated pt about availability of breast swell spot for long term management of lymphedema and to help decrease fibrosis.    PT Frequency 1x / week    PT Duration --   5 weeks   PT Treatment/Interventions ADLs/Self Care Home Management;Therapeutic exercise;Patient/family education;Manual lymph drainage;Taping;Manual techniques;Passive range of motion;DME Instruction;Iontophoresis '4mg'$ /ml Dexamethasone    PT Next Visit Plan did pt purchase breast swell spot, MRI results? L br lymphedema, STM to L shoulder and trunk musculature    Consulted and Agree with Plan of Care Patient             Patient will benefit from skilled therapeutic intervention in order to improve the following deficits and impairments:  Decreased skin integrity, Increased edema, Decreased knowledge of precautions, Decreased range of motion, Decreased strength, Impaired UE functional use, Postural dysfunction, Pain  Visit Diagnosis: Lymphedema, not elsewhere classified  Chronic left shoulder pain     Problem List Patient Active Problem List   Diagnosis Date Noted   Left rotator cuff tear 10/08/2020    Allyson Sabal [facility] 10/16/2020, 1:55 PM  [place], [place], [address] Phone: [phone]   Fax:  [phone]  Name: Olivia [name] MRN: [id number] Date of Birth: 1970-07-20  Manus Gunning, PT 10/16/20 1:56 PM

## 2020-10-19 ENCOUNTER — Ambulatory Visit (HOSPITAL_BASED_OUTPATIENT_CLINIC_OR_DEPARTMENT_OTHER): Payer: Commercial Managed Care - POS | Admitting: Family Nurse Practitioner

## 2020-10-23 ENCOUNTER — Ambulatory Visit: Payer: Managed Care, Other (non HMO) | Admitting: Rehabilitation

## 2020-10-23 ENCOUNTER — Other Ambulatory Visit: Payer: Self-pay | Admitting: Family Medicine

## 2020-10-23 ENCOUNTER — Ambulatory Visit
Admission: RE | Admit: 2020-10-23 | Discharge: 2020-10-23 | Disposition: A | Discharge status: Home / Self Care | Payer: Managed Care, Other (non HMO) | Source: Ambulatory Visit | Attending: Family Medicine | Admitting: Family Medicine

## 2020-10-23 ENCOUNTER — Other Ambulatory Visit: Payer: Self-pay

## 2020-10-23 DIAGNOSIS — M25512 Pain in left shoulder: Secondary | ICD-10-CM

## 2020-10-23 DIAGNOSIS — G8929 Other chronic pain: Secondary | ICD-10-CM

## 2020-10-28 ENCOUNTER — Encounter: Payer: Self-pay | Admitting: Family Medicine

## 2020-10-30 ENCOUNTER — Other Ambulatory Visit: Payer: Self-pay

## 2020-10-30 ENCOUNTER — Encounter: Payer: Self-pay | Admitting: Rehabilitation

## 2020-10-30 ENCOUNTER — Ambulatory Visit: Payer: Managed Care, Other (non HMO) | Attending: Family | Admitting: Rehabilitation

## 2020-10-30 DIAGNOSIS — G8929 Other chronic pain: Secondary | ICD-10-CM | POA: Diagnosis present

## 2020-10-30 DIAGNOSIS — M25512 Pain in left shoulder: Secondary | ICD-10-CM | POA: Insufficient documentation

## 2020-10-30 DIAGNOSIS — I890 Lymphedema, not elsewhere classified: Secondary | ICD-10-CM | POA: Insufficient documentation

## 2020-10-30 DIAGNOSIS — M25511 Pain in right shoulder: Secondary | ICD-10-CM | POA: Insufficient documentation

## 2020-10-30 NOTE — Therapy (Signed)
[place], [place], [address] Phone: [phone]   Fax:  [phone]  Physical Therapy Treatment  Patient Details  Name: Olivia [name] MRN: [id number] Date of Birth: [birth year] Referring Provider (PT): Olin Hauser, FNP   Encounter Date: 10/30/2020   PT End of Session - 10/30/20 1352     Visit Number 10    Number of Visits 11    Date for PT Re-Evaluation 12/11/20    PT Start Time 1300    PT Stop Time 1350    PT Time Calculation (min) 50 min    Activity Tolerance Patient tolerated treatment well    Behavior During Therapy [facility] for tasks assessed/performed             Past Medical History:  Diagnosis Date   Breast cancer ([place])    Hypertension     Past Surgical History:  Procedure Laterality Date   LYMPHADENECTOMY      There were no vitals filed for this visit.   Subjective Assessment - 10/30/20 1304     Subjective I have a rotator cuff tear. He told me that I needed surgery but it isn't a good time due to work. I am probably going to get the injection. I got my pump today and have to set up my training.    Pertinent History Left breast cancer IDC stage 2 with lumpectomy and bilateral reduction and lift and 0/2 nodes removed on 10/14/19 in [place].  Radiation and chemotherapy completed. 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    Currently in Pain? Yes    Pain Score 2     Pain Location Breast    Pain Orientation Left    Pain Descriptors / Indicators Aching    Pain Type Acute pain    Pain Onset More than a month ago    Pain Frequency Constant                               [facility] Adult PT Treatment/Exercise - 10/30/20 0001       Self-Care   Posture discussed MRI findings and questions regarding shoulder surgery and injection options x 26mn      Manual Therapy   Manual Lymphatic Drainage (MLD) short neck, 5 diaphragmatic breaths, right  axillary nodes and establishment of interaxillary pathway, left inguinal nodes and establishment of axillo inguinal pathway, L breast moving fluid towards pathways with increased time spent at medial inferior breast in area of fibrosis then retracing all steps                          PT Long Term Goals - 10/30/20 1319       PT LONG TERM GOAL #1   Title Pt will improve bil shoulder flexion to at least 150 to demonstrate improved mobility    Baseline 09/19/20-L shoulder flexion 103, abd 830mue to rotator cuff tear    Status Not Met      PT LONG TERM GOAL #2   Title Pt will improve bil shoulder abduction to at least 140 to demonstrate improved mobility    Baseline 09/19/20- L sh abd 80    Status Not Met      PT LONG TERM GOAL #3   Title Pt will decrease shoulder pain to 0/10 at rest    Status Not Met  PT LONG TERM GOAL #4   Title Pt will be ind with self MLD for the left breast lymphedema    Baseline pt has massage machine    Status Achieved      PT LONG TERM GOAL #5   Title Pt will be ind with final HEP for continued mobility    Baseline will return for shoulder strengthening post injection    Status Partially Met                   Plan - 10/30/20 1355     Clinical Impression Statement MRI showing full thickness partial tear of the supraspinatus, possible labrum tear, joint/muscle edema, and chondral thinning.  Pt will try injection first as she is not able to do surgery until next calendar year.  Continued with left breast MLD with continued significant fibrosis of th einferior breast.  Pt now has her flexitouch pump and is getting a swell spot so she will do this and return after injection for education on shoulder exercises to continue and any more needs.    PT Frequency 1x / week    PT Duration 4 weeks    PT Treatment/Interventions ADLs/Self Care Home Management;Therapeutic exercise;Patient/family education;Manual lymph drainage;Taping;Manual  techniques;Passive range of motion;DME Instruction;Iontophoresis 5m/ml Dexamethasone    PT Next Visit Plan did pt purchase breast swell spot, how is breast overall? any more needs?  education on painfree RTC strengthening after injection if the pain is better    Consulted and Agree with Plan of Care Patient             Patient will benefit from skilled therapeutic intervention in order to improve the following deficits and impairments:  Decreased skin integrity, Increased edema, Decreased knowledge of precautions, Decreased range of motion, Decreased strength, Impaired UE functional use, Postural dysfunction, Pain  Visit Diagnosis: Lymphedema, not elsewhere classified  Chronic left shoulder pain  Chronic right shoulder pain     Problem List Patient Active Problem List   Diagnosis Date Noted   Left rotator cuff tear 10/08/2020    TStark Bray PT 10/30/2020, 1:59 PM  [place] [address]Phone: [phone]  Fax:  [phone] Name: Olivia BecraftMRN: [id number] of Birth: [birth year]

## 2020-10-30 NOTE — Progress Notes (Signed)
 [facility] Breast Cancer Follow Up    Treatment Team  Ref Prov: Christobal Borrow, MD    Primary Care Provider:   Christobal Borrow, MD Radiology facility: 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 Ki-67:  80 % Oncotype Dx:  not indicated    Estrogen Receptor: negative Progesterone Receptor: negative Her-2-neu Receptor: 1+ , negative     Staging  Tumor Size:   4.0 cm      pathCR Nodes: clinically/US  negative      0/2 on surgical path Systemic Metastases: clinically negative Stage: Stage IIA , T2 N0 M0    ypT0 ypN0   Treatment  Modality Date     Surgery     10/14/19  left lumpectomy with sentinel node biopsy and bilateral mammoplasty   Radiation  completed 01/09/20     Endocrine       Chemotherapy  completed 09/15/19  neoadjuvant AC-TC   Biologic       Clinical trial            HPI   CC: Follow up left breast cancer    Ms Leslie [name] is a 50 y.o. female patient here for follow up of her history of left breast cancer originally diagnosed in February 2021.  She is currently on Serial imaging    She reports continued pain in the left axilla and breast. She also has known rotator cuff issue on the left. She is scheduled for cortisone shot in the shoulder on Monday. She will also start using her new compression device next week. The patient is otherwise asymptomatic and denies any new breast pain , palpable masses, skin or nipple changes or nipple  discharge.     Recent Imaging:  BDM 04/25/20: Postsurgical changes in both breasts with no mammographic  evidence of malignancy in either breast. Unless clinically indicated  sooner, annual bilateral mammogram is recommended in one year.  BIRADS2    The following portions of the patient's history were reviewed and updated as appropriate: allergies, current medications, past family history, past medical history, past social history, past surgical history and problem list.     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    Family History     Family History   Problem Relation Age of Onset    Hypertension Mother     Coronary artery disease Mother     Heart disease Mother     Hyperlipidemia Mother     Hypertension Father     Diabetes Father     Other Father         cardiac catheterization    Hypertension Sister     Breast cancer Maternal Aunt     Uterine cancer Maternal  Aunt     Vaginal cancer Maternal Aunt     Breast cancer Other         maternal cousin    Ovarian cancer Neg Hx         Review of Systems   ROS:  Constitutional:  negative  HEENT:  negative  Cardiovascular:  negative  Respiratory:  negative  Gastrointestinal:  negative  Genitourinary:  negative  Musculoskeletal:  negative  Skin:  negative  Neurologic:  negative  Psychiatric:  negative  Endocrine:  negative  Hematologic:  negative               Physical Exam   BP 128/85 (BP Site: Right arm, Patient Position: Sitting)   Pulse 79   Temp 97.4 F (36.3 C) (Temporal)   Resp 16   Ht 1.753 m (5' 9)   Wt 127.9 kg (282 lb)   SpO2 99%   BMI 41.64 kg/m     WDWN female in NAD  HEENT:  Clear, no scleral icterus, neck supple  Neck:  No  thyromegaly or masses, trachea midline  Chest: Respiratory effort normal  BJM:  Gait and station normal  Ext:  Decreased ROM left shoulder.   Skin:  Free of significant ulcers or lesions  Neuro:  Grossly intact, no focal findings, alert and oriented, asks appropriate questions  LN:  No axillary, supraclavicular or cervical adenopathy  Breast:  Symmetric . Ptosis 1   Right breast: No skin or nipple changes, no nipple retraction or discharge . No palpable masses. Well healed incision.    Left breast : No skin or nipple changes, no nipple retraction or discharge . No palpable masses. Well healed incision. Lymphedema r/t post-radiation change and mild skin thickening.     Rads   Radiological imaging and reports reviewed as above.     Assessment/Plan   Personal history of breast carcinoma    Patient is doing well. She currently has no evidence of disease .  Her physical and emotional recovery is good. Cosmetic outcome is good .  I have recommended continued treatment with Serial imaging    Surveillance / Folllow up recommendations:    I reviewed the importance of breast exams and follow up at six month intervals for the first five years after the first treatment; alternating exams at 6 month intervals with medical oncology is a good option between years three and five to limit the number of times needed for follow up ; and every year thereafter.  I have recommended long term follow-up because there is a low but measurable risk of breast cancer recurrence even more than 10 years after treatment. In addition, if  there is remaining breast tissue, there is a higher risk to develop a new, independent, breast cancer at some time in the future.    After lumpectomy, we recommend mammograms once a year. The purpose is to monitor for local recurrence and detection of new primary.    Patient is advised to report these symptoms : new lumps, bone pain, chest pain, shortness of breath or difficulty breathing, abdominal pain, or  persistent headaches.     The following tests are not recommended for routine breast cancer follow-up: breast MRI, FDG-PET scans, complete blood cell counts, automated chemistry studies, chest x-rays, bone scans, liver ultrasound, and tumor markers (CA 15-3, CA 27.29, CEA).     Nutrition  I have also discussed life-style choices to decrease risk of breast cancer such as:   Healthy eating habits,  meals high in fruits, vegetables, nuts and complex carbohydrates, low in animal fats.  Limit the amount of alcohol ingested   Avoid  Hormone Replacement Therapy.    Exercise  Continue to exercise, aerobic 30 - 40 minutes , 3 -4 times / week    Follow up  Bilateral mammogram due March 2023  Follow up visit with me  in 6 months, sooner if concerns. Can then move to annual visits.   Follow up with Medical Oncology and Radiation Oncology.  Patient agrees with plan.

## 2020-11-02 ENCOUNTER — Encounter (HOSPITAL_BASED_OUTPATIENT_CLINIC_OR_DEPARTMENT_OTHER): Payer: Self-pay | Admitting: Family Nurse Practitioner

## 2020-11-02 ENCOUNTER — Ambulatory Visit (INDEPENDENT_AMBULATORY_CARE_PROVIDER_SITE_OTHER): Payer: Commercial Managed Care - POS | Admitting: Family Nurse Practitioner

## 2020-11-02 VITALS — BP 128/85 | HR 79 | Temp 97.4°F | Resp 16 | Ht 69.0 in | Wt 282.0 lb

## 2020-11-02 DIAGNOSIS — Z853 Personal history of malignant neoplasm of breast: Secondary | ICD-10-CM

## 2020-11-05 ENCOUNTER — Other Ambulatory Visit: Payer: Self-pay

## 2020-11-05 ENCOUNTER — Ambulatory Visit (INDEPENDENT_AMBULATORY_CARE_PROVIDER_SITE_OTHER): Payer: Managed Care, Other (non HMO) | Admitting: Sports Medicine

## 2020-11-05 VITALS — BP 134/82 | HR 80 | Ht 69.0 in | Wt 284.0 lb

## 2020-11-05 DIAGNOSIS — M25512 Pain in left shoulder: Secondary | ICD-10-CM

## 2020-11-05 DIAGNOSIS — G8929 Other chronic pain: Secondary | ICD-10-CM | POA: Diagnosis not present

## 2020-11-05 DIAGNOSIS — M25511 Pain in right shoulder: Secondary | ICD-10-CM | POA: Diagnosis not present

## 2020-11-05 DIAGNOSIS — M75112 Incomplete rotator cuff tear or rupture of left shoulder, not specified as traumatic: Secondary | ICD-10-CM | POA: Diagnosis not present

## 2020-11-05 NOTE — Patient Instructions (Signed)
Injection today See you again in 4-6 weeks

## 2020-11-05 NOTE — Progress Notes (Signed)
    Olivia [name] D.[place] Phone: [phone]   Assessment and Plan:     1. Nontraumatic incomplete tear of left rotator cuff 2. Chronic pain of both shoulders -Chronic, unchanged, subsequent sports medicine visit - Patient has full-thickness partial tear of distal anterior supraspinatus tendon based on recent MRI - Patient has elected to proceed with conservative therapy including physical therapy and CSI - CSI tolerated well per procedure note below - May restart physical therapy this week.   Pertinent previous records reviewed include previous office visit, shoulder MRI  Procedure: Subacromial Injection Side: left  Risks explained and consent was given verbally. The site was cleaned with alcohol prep. A steroid injection was performed from posterior approach using 69m of 1% lidocaine without epinephrine and 131mof kenalog '40mg'$ /ml. This was well tolerated and resulted in symptomatic relief.  Needle was removed, hemostasis achieved, and post injection instructions were explained.   Pt was advised to call or return to clinic if these symptoms worsen or fail to improve as anticipated.  Follow Up: In 4 weeks for reevaluation   Subjective:   I, Olivia [name] serving as a scEducation administratoror Dr. BeGlennon [name] Chief Complaint: Left Shoulder pain  HPI:   11/05/20 Here for injections. No change in shoulder pain.  Patient experienced chronic shoulder pain with worsening since 02/2020 without new injury or mechanism.  She had a recent MRI and has been in discussions with Dr. SmTamala Julianbout how to proceed.  She has been wearing establishing care with orthopedic surgery versus conservative therapy and wishes to continue with conservative therapy at this time including PT and CSI.  Relevant Historical Information: None pertinent  Additional pertinent review of systems negative.   Current Outpatient Medications:     cetirizine (ZYRTEC ALLERGY) 10 MG tablet, Take 1 tablet (10 mg total) by mouth daily., Disp: 30 tablet, Rfl: 2   lidocaine (XYLOCAINE) 2 % solution, Use as directed 15 mLs in the mouth or throat as needed for mouth pain., Disp: 100 mL, Rfl: 0   losartan-hydrochlorothiazide (HYZAAR) 100-25 MG tablet, Take 1 tablet by mouth daily., Disp: , Rfl:    Objective:     Vitals:   11/05/20 1256  BP: 134/82  Pulse: 80  SpO2: 96%  Weight: 284 lb (128.8 kg)  Height: '5\' 9"'$  (1.753 m)      Body mass index is 41.94 kg/m.    Physical Exam:    Gen: Appears well, nad, nontoxic and pleasant Neuro:sensation intact, strength is 5/5 with df/pf/inv/ev, muscle tone wnl Skin: no suspicious lesion or defmority Psych: A&O, appropriate mood and affect  Left shoulder: no deformity, swelling or muscle wasting No scapular winging FF 100, abd 80, int 20, ext 70 TTP trapezius, deltoid NTTP over the [place], clavicle, ac, coracoid, biceps groove, humerus, deltoid, scap spine, cervical spine FROM of neck    Electronically signed by:  BeBenito [name].O.[facility] 1:21 PM 11/05/20

## 2020-11-21 ENCOUNTER — Encounter: Payer: Self-pay | Admitting: Internal Medicine

## 2020-11-22 ENCOUNTER — Encounter (HOSPITAL_BASED_OUTPATIENT_CLINIC_OR_DEPARTMENT_OTHER): Payer: Self-pay | Admitting: Family Nurse Practitioner

## 2020-12-06 ENCOUNTER — Other Ambulatory Visit (INDEPENDENT_AMBULATORY_CARE_PROVIDER_SITE_OTHER): Payer: Self-pay | Admitting: Family Medicine

## 2020-12-13 ENCOUNTER — Ambulatory Visit (INDEPENDENT_AMBULATORY_CARE_PROVIDER_SITE_OTHER): Payer: Managed Care, Other (non HMO) | Admitting: Sports Medicine

## 2020-12-13 ENCOUNTER — Other Ambulatory Visit: Payer: Self-pay

## 2020-12-13 VITALS — BP 118/70 | HR 84 | Ht 69.0 in | Wt 284.0 lb

## 2020-12-13 DIAGNOSIS — M25511 Pain in right shoulder: Secondary | ICD-10-CM

## 2020-12-13 DIAGNOSIS — M25512 Pain in left shoulder: Secondary | ICD-10-CM | POA: Diagnosis not present

## 2020-12-13 DIAGNOSIS — G8929 Other chronic pain: Secondary | ICD-10-CM | POA: Diagnosis not present

## 2020-12-13 DIAGNOSIS — M75112 Incomplete rotator cuff tear or rupture of left shoulder, not specified as traumatic: Secondary | ICD-10-CM | POA: Diagnosis not present

## 2020-12-13 NOTE — Progress Notes (Signed)
    Olivia [name] D.[place] Phone: [phone]   Assessment and Plan:    1. Nontraumatic incomplete tear of left rotator cuff 2. Chronic pain of both shoulders -Chronic, improving, subsequent visit - Full-thickness partial tear of distal anterior supraspinatus tendon based on MRI with moderate-significant relief of symptoms after subacromial CSI for 3 to 4 weeks with pain now returning - Patient was unable to start physical therapy and has not been doing HEP because of family circumstances.  She states she is able to start PT now and she will call the number that we provided to establish care - Continue HEP - Advised to use Tylenol for day-to-day pain and ibuprofen for breakthrough - Start Voltaren gel use over shoulder   Pertinent previous records reviewed include none   Follow Up: In 2 months for reevaluation and potential repeat subacromial CSI   Subjective:   I, Judy Pimple, am serving as a scribe for Dr. Glennon Mac  Chief Complaint: Bilateral shoulder pain   HPI:   11/05/20 Here for injections. No change in shoulder pain.  Patient experienced chronic shoulder pain with worsening since 02/2020 without new injury or mechanism.  She had a recent MRI and has been in discussions with Dr. Tamala Julian about how to proceed.  She has been wearing establishing care with orthopedic surgery versus conservative therapy and wishes to continue with conservative therapy at this time including PT and CSI.  12/13/20 Patient states that she felt really good for a few weeks after the injection, but the pain has come back slowly with the soreness and pain in left shoulder   Relevant Historical Information: None pertinent  Additional pertinent review of systems negative.   Current Outpatient Medications:    cetirizine (ZYRTEC ALLERGY) 10 MG tablet, Take 1 tablet (10 mg total) by mouth daily., Disp: 30 tablet, Rfl: 2    lidocaine (XYLOCAINE) 2 % solution, Use as directed 15 mLs in the mouth or throat as needed for mouth pain., Disp: 100 mL, Rfl: 0   losartan-hydrochlorothiazide (HYZAAR) 100-25 MG tablet, Take 1 tablet by mouth daily., Disp: , Rfl:    Objective:     Vitals:   12/13/20 1257  BP: 118/70  Pulse: 84  SpO2: 99%  Weight: 284 lb (128.8 kg)  Height: 5\' 9"  (1.753 m)      Body mass index is 41.94 kg/m.    Physical Exam:    Gen: Appears well, nad, nontoxic and pleasant Neuro:sensation intact, strength is 5/5 with df/pf/inv/ev, muscle tone wnl Skin: no suspicious lesion or defmority Psych: A&O, appropriate mood and affect  Left shoulder: no deformity, swelling or muscle wasting No scapular winging FF 90, abd 80, int 20, ext 70 TTP trapezius NTTP over the [place], clavicle, ac, coracoid, biceps groove, humerus, deltoid, cervical spine Positive empty can Negative Spurling's test bilat FROM of neck    Electronically signed by:  Olivia [name] D.[facility] 1:16 PM 12/13/20

## 2020-12-13 NOTE — Patient Instructions (Signed)
Call and schedule physical therapy  Start Tylenol daily as needed for pain control Can use Ibuprofen for break through pain Start Voltaren gel topical over shoulder. Can get over the counter.  Follow up in two months before christmas for repeat injection

## 2020-12-26 ENCOUNTER — Encounter: Payer: Self-pay | Admitting: Internal Medicine

## 2020-12-31 ENCOUNTER — Encounter (INDEPENDENT_AMBULATORY_CARE_PROVIDER_SITE_OTHER): Payer: Self-pay | Admitting: Family Medicine

## 2020-12-31 ENCOUNTER — Telehealth (INDEPENDENT_AMBULATORY_CARE_PROVIDER_SITE_OTHER): Payer: Self-pay

## 2020-12-31 NOTE — Telephone Encounter (Signed)
Today we received a request to transfer all medical records to new provider    Faxed to HIM

## 2021-01-08 ENCOUNTER — Encounter: Payer: Managed Care, Other (non HMO) | Admitting: Rehabilitation

## 2021-01-16 ENCOUNTER — Encounter: Payer: Self-pay | Admitting: Physical Therapy

## 2021-01-16 ENCOUNTER — Other Ambulatory Visit: Payer: Self-pay

## 2021-01-16 ENCOUNTER — Ambulatory Visit: Payer: Managed Care, Other (non HMO) | Attending: Family | Admitting: Physical Therapy

## 2021-01-16 DIAGNOSIS — M25512 Pain in left shoulder: Secondary | ICD-10-CM | POA: Diagnosis present

## 2021-01-16 DIAGNOSIS — I890 Lymphedema, not elsewhere classified: Secondary | ICD-10-CM | POA: Insufficient documentation

## 2021-01-16 DIAGNOSIS — G8929 Other chronic pain: Secondary | ICD-10-CM | POA: Insufficient documentation

## 2021-01-16 DIAGNOSIS — M25612 Stiffness of left shoulder, not elsewhere classified: Secondary | ICD-10-CM | POA: Insufficient documentation

## 2021-01-16 NOTE — Therapy (Signed)
[facility] @ [place], [place], [address] Phone: [phone]   Fax:  [phone]  Physical Therapy Treatment  Patient Details  Name: Rome Schlauch MRN: [id number] Date of Birth: 1970/12/26 Referring Provider (PT): Olin Hauser, FNP   Encounter Date: 01/16/2021   PT End of Session - 01/16/21 1448     Visit Number 11    Number of Visits 19    Date for PT Re-Evaluation 02/27/21    PT Start Time 1410    PT Stop Time 1448    PT Time Calculation (min) 38 min    Activity Tolerance Patient tolerated treatment well    Behavior During Therapy [facility] for tasks assessed/performed             Past Medical History:  Diagnosis Date   Breast cancer ([place])    Hypertension     Past Surgical History:  Procedure Laterality Date   LYMPHADENECTOMY      There were no vitals filed for this visit.   Subjective Assessment - 01/16/21 1412     Subjective I am scheduled for another injection in my shoulder in December 23. After the injection my shoulder felt better but then it began hurting again. The breast seems the same. My doctor wants me to do some strengthening for the rotator cuff so the next injection works better.    Pertinent History Left breast cancer IDC stage 2 with lumpectomy and bilateral reduction and lift and 0/2 nodes removed on 10/14/19 in [place].  Radiation and chemotherapy completed. 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 No/denies    Pain Score 0-No pain                [facility] PT Assessment - 01/16/21 0001       Observation/Other Assessments   Observations fullness noted at left lateral trunk      AROM   Right Shoulder Flexion 161 Degrees    Right Shoulder ABduction 105 Degrees    Left Shoulder Flexion 168 Degrees    Left Shoulder ABduction 66 Degrees                           [facility]  Adult PT Treatment/Exercise - 01/16/21 0001       Shoulder Exercises: Standing   Other Standing Exercises using dual cross cable machine: walkouts with 3 lbs in to flexion, abduction and ER all x 10 reps each on L side with v/c to keep arm stabilized      Shoulder Exercises: Isometric Strengthening   Flexion Other (comment)   10 x 5"   Extension Other (comment)   10 x 5"   External Rotation Other (comment)   10 x 5"                         PT Long Term Goals - 01/16/21 1415       PT LONG TERM GOAL #1   Title Pt will improve bil shoulder flexion to at least 150 to demonstrate improved mobility    Baseline 09/19/20-L shoulder flexion 103, abd 56mdue to rotator cuff tear; 01/16/21- R 161 L 105    Time 16    Period Weeks    Status Partially Met      PT LONG TERM GOAL #2  Title Pt will improve bil shoulder abduction to at least 140 to demonstrate improved mobility    Baseline 09/19/20- L sh abd 80; 01/16/21- R 168 L 66    Time 16    Period Weeks    Status Partially Met      PT LONG TERM GOAL #3   Title Pt will decrease shoulder pain to 0/10 at rest    Baseline 01/16/21- pt reports she does not have shoulder pain at rest    Time 16    Period Weeks    Status Achieved      PT LONG TERM GOAL #4   Title Pt will be ind with self MLD for the left breast lymphedema    Baseline pt has massage machine    Time 16    Period Weeks    Status Achieved      PT LONG TERM GOAL #5   Title Pt will be ind with final HEP for continued mobility    Baseline will return for shoulder strengthening post injection    Time 16    Period Weeks    Status On-going      Additional Long Term Goals   Additional Long Term Goals Yes      PT LONG TERM GOAL #6   Title Pt will be able to sit at the computer and type for long periods of time with no increase in shoulder pain.    Time 6    Period Weeks    Status New    Target Date 02/27/21                   Plan - 01/16/21 1454      Clinical Impression Statement Pt returns to PT after undergoing an injection in her shoulder to help decrease pain from a full thickness partial tear of the supraspinatus. Pt also has a possible labrum tear, joint/muscle edema and chondra thinning. She reports her breast swelling is about the same but has increased left truncal edema. Cut 1/2 grey foam and placed in thick stockinette for pt to wear in her bra for additional compression. She has been using her [place] but did not have a chance over the holidays. Pt has returned for rotator cuff strengthening because her doctor says it will help her next injection to be more effective. Her next injection is Dec 23. Added a new goal to address shoulder pain while pt is at work. Began instructing in isometric exercises today. Educated pt to do shoulder isometrics at home with 5 sec holds x 10 reps in direction of flexion, abduction and ER. Pt would benefit from additional skilled PT visits to improve L shoulder strength, decrease edema and decrease L shoulder pain.    PT Frequency 1x / week    PT Duration 6 weeks    PT Treatment/Interventions ADLs/Self Care Home Management;Therapeutic exercise;Patient/family education;Manual lymph drainage;Taping;Manual techniques;Passive range of motion;DME Instruction;Iontophoresis 21m/ml Dexamethasone;Joint Manipulations    PT Next Visit Plan see how shoulder felt after last session, give walkouts with yellow or red band, instruct in rockwood and supine scap, do scap stabs on wall using ball    Consulted and Agree with Plan of Care Patient             Patient will benefit from skilled therapeutic intervention in order to improve the following deficits and impairments:  Decreased skin integrity, Increased edema, Decreased knowledge of precautions, Decreased range of motion, Decreased strength, Impaired UE functional use, Postural dysfunction,  Pain  Visit Diagnosis: Chronic left shoulder pain  Stiffness of  left shoulder, not elsewhere classified  Lymphedema, not elsewhere classified     Problem List Patient Active Problem List   Diagnosis Date Noted   Left rotator cuff tear 10/08/2020    Allyson Sabal [place], PT 01/16/2021, 3:03 PM  [facility] @ [place], [place], [address] Phone: [phone]   Fax:  [phone]  Name: Avaiyah Strubel MRN: [id number] Date of Birth: [birth year]   Manus Gunning, PT 01/16/21 3:03 PM

## 2021-01-21 ENCOUNTER — Ambulatory Visit: Payer: Commercial Managed Care - POS | Admitting: Internal Medicine

## 2021-01-21 ENCOUNTER — Other Ambulatory Visit: Payer: Commercial Managed Care - POS

## 2021-01-23 ENCOUNTER — Ambulatory Visit: Payer: Managed Care, Other (non HMO) | Attending: Family | Admitting: Physical Therapy

## 2021-01-23 ENCOUNTER — Encounter: Payer: Self-pay | Admitting: Physical Therapy

## 2021-01-23 ENCOUNTER — Other Ambulatory Visit: Payer: Self-pay

## 2021-01-23 DIAGNOSIS — M25612 Stiffness of left shoulder, not elsewhere classified: Secondary | ICD-10-CM | POA: Diagnosis present

## 2021-01-23 DIAGNOSIS — G8929 Other chronic pain: Secondary | ICD-10-CM | POA: Diagnosis present

## 2021-01-23 DIAGNOSIS — I890 Lymphedema, not elsewhere classified: Secondary | ICD-10-CM | POA: Diagnosis present

## 2021-01-23 DIAGNOSIS — M25512 Pain in left shoulder: Secondary | ICD-10-CM | POA: Insufficient documentation

## 2021-01-23 NOTE — Therapy (Signed)
[facility] @ [address] [place], [place], [address] Phone: [phone]   Fax:  [phone]  Physical Therapy Treatment  Patient Details  Name: Olivia [name] MRN: [id number] Date of Birth: [birth year] Referring Provider (PT): Olin Hauser, FNP   Encounter Date: 01/23/2021   PT End of Session - 01/23/21 1357     Visit Number 12    Number of Visits 19    Date for PT Re-Evaluation 02/27/21    PT Start Time 4174    PT Stop Time 1357    PT Time Calculation (min) 49 min    Activity Tolerance Patient tolerated treatment well    Behavior During Therapy [facility] for tasks assessed/performed             Past Medical History:  Diagnosis Date   Breast cancer ([place])    Hypertension     Past Surgical History:  Procedure Laterality Date   LYMPHADENECTOMY      There were no vitals filed for this visit.   Subjective Assessment - 01/23/21 1309     Subjective My arm was a little sore but not bad. I am doing the isometric exercises at home and by the end my arm is tired. I can not tell any improvement in pain yet.    Pertinent History Left breast cancer IDC stage 2 with lumpectomy and bilateral reduction and lift and 0/2 nodes removed on 10/14/19 in [place].  Radiation and chemotherapy completed. 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 No/denies    Pain Score 0-No pain                               [facility] Adult PT Treatment/Exercise - 01/23/21 0001       Shoulder Exercises: Supine   Other Supine Exercises small stabilization circles x 20 reps in both directions with pt having increased difficulty - no pain but reports her arm feels very heavy      Shoulder Exercises: Standing   Other Standing Exercises with red theraband attached to treadmill hand rail - walk outs in direction of flexion x 10 reps, extension x 10 reps,  ER x 10 reps, IR x 10 reps    Other Standing Exercises [place] exercises using yellow theraband attached to treadmill hand hold: extension x 10 reps, ER x 10 reps, IR x 10 reps attempted flexion but pt had increased pain so stopped; attempted stabilization with ball on wall but pt had increased pain so stopped this as well      Manual Therapy   Soft tissue mobilization to L lateral trunk to lateral edge of lats and serratus  with numerous areas of tightness noted that improved greatly with treatment                          PT Long Term Goals - 01/16/21 1415       PT LONG TERM GOAL #1   Title Pt will improve bil shoulder flexion to at least 150 to demonstrate improved mobility    Baseline 09/19/20-L shoulder flexion 103, abd 60mdue to rotator cuff tear; 01/16/21- R 161 L 105    Time 16    Period Weeks    Status Partially Met      PT LONG TERM  GOAL #2   Title Pt will improve bil shoulder abduction to at least 140 to demonstrate improved mobility    Baseline 09/19/20- L sh abd 80; 01/16/21- R 168 L 66    Time 16    Period Weeks    Status Partially Met      PT LONG TERM GOAL #3   Title Pt will decrease shoulder pain to 0/10 at rest    Baseline 01/16/21- pt reports she does not have shoulder pain at rest    Time 16    Period Weeks    Status Achieved      PT LONG TERM GOAL #4   Title Pt will be ind with self MLD for the left breast lymphedema    Baseline pt has massage machine    Time 16    Period Weeks    Status Achieved      PT LONG TERM GOAL #5   Title Pt will be ind with final HEP for continued mobility    Baseline will return for shoulder strengthening post injection    Time 16    Period Weeks    Status On-going      Additional Long Term Goals   Additional Long Term Goals Yes      PT LONG TERM GOAL #6   Title Pt will be able to sit at the computer and type for long periods of time with no increase in shoulder pain.    Time 6    Period Weeks    Status  New    Target Date 02/27/21                   Plan - 01/23/21 1404     Clinical Impression Statement Pt reports she has been doing her isometric exercises at home with only mild discomfort - more muscle fatigue. Added rockwood and walkouts with theraband to pt's home exercise program today. Pt was unable to do flexion during rockwood exercises due to pain but was able to complete all other directions. Ended session with soft tissue mobilization to serratus and lats where pt had increased tightness and muslce knots that improved greatly. Pt felt marked improvement by end of STM.    PT Frequency 1x / week    PT Duration 6 weeks    PT Treatment/Interventions ADLs/Self Care Home Management;Therapeutic exercise;Patient/family education;Manual lymph drainage;Taping;Manual techniques;Passive range of motion;DME Instruction;Iontophoresis 87m/ml Dexamethasone;Joint Manipulations    PT Next Visit Plan see how shoulder felt after last session, how are new exercises doing? STM to lats and serratus    Consulted and Agree with Plan of Care Patient             Patient will benefit from skilled therapeutic intervention in order to improve the following deficits and impairments:  Decreased skin integrity, Increased edema, Decreased knowledge of precautions, Decreased range of motion, Decreased strength, Impaired UE functional use, Postural dysfunction, Pain  Visit Diagnosis: Chronic left shoulder pain  Stiffness of left shoulder, not elsewhere classified  Lymphedema, not elsewhere classified     Problem List Patient Active Problem List   Diagnosis Date Noted   Left rotator cuff tear 10/08/2020    BAllyson SabalBRule PT 01/23/2021, 3:05 PM  [place] [place] [address]Phone: [phone]  Fax:  [phone] Name: Olivia EllersonMRN: [id number] of Birth: 1June 20, 1972

## 2021-01-23 NOTE — Patient Instructions (Signed)
Strengthening: Resisted Flexion    Cancer Rehab [phone]    Hold tubing with left arm at side. Pull forward and up. Move shoulder through pain-free range of motion. Repeat _5-10___ times per set. Do _1-2___ sessions per day.  -While doing walkouts, hold band in hand and step forward while keeping arm at side x 10 reps  Strengthening: Resisted Internal Rotation    Hold tubing in left hand, elbow at side and forearm out. Rotate forearm in across body. Repeat _5-10___ times per set. Do _1-2___ sessions per day.  - When doing walkouts, hold band in hand with elbow bent to 90 degrees and upper arm at side, slowly side step away from door while keep elbow tucked at side and not allowing forearm to move x 10 reps  Strengthening: Resisted Extension    Hold tubing in left hand, arm forward. Pull arm back, elbow straight. Repeat __5-10__ times per set. Do __1-2__ sessions per day.  - While doing walkouts, hold band at side with elbow straight and step backwards away from door while keeping arm still x 10 reps  Strengthening: Resisted External Rotation    Hold tubing in left hand, elbow at side and forearm across body. Rotate forearm out.  Repeat _10___ times per set. Do __1-2__ sessions per day.  - When doing walkouts, hold band in hand with elbow bent to 90 degrees and upper arm at side, slowly side step away from door while keep elbow tucked at side and not allowing forearm to move x 10 reps

## 2021-01-30 ENCOUNTER — Ambulatory Visit: Payer: Managed Care, Other (non HMO) | Admitting: Physical Therapy

## 2021-01-30 ENCOUNTER — Encounter: Payer: Self-pay | Admitting: Physical Therapy

## 2021-01-30 ENCOUNTER — Other Ambulatory Visit: Payer: Self-pay

## 2021-01-30 DIAGNOSIS — G8929 Other chronic pain: Secondary | ICD-10-CM

## 2021-01-30 DIAGNOSIS — I890 Lymphedema, not elsewhere classified: Secondary | ICD-10-CM

## 2021-01-30 DIAGNOSIS — M25612 Stiffness of left shoulder, not elsewhere classified: Secondary | ICD-10-CM

## 2021-01-30 DIAGNOSIS — M25512 Pain in left shoulder: Secondary | ICD-10-CM

## 2021-01-30 NOTE — Therapy (Signed)
[facility] @ [address] [place], [place], [address] Phone: [phone]   Fax:  [phone]  Physical Therapy Treatment  Patient Details  Name: Olivia [name] MRN: [id number] Date of Birth: 1970/03/21 Referring Provider (PT): Olin Hauser, FNP   Encounter Date: 01/30/2021   PT End of Session - 01/30/21 0955     Visit Number 13    Number of Visits 19    Date for PT Re-Evaluation 02/27/21    PT Start Time 0907    PT Stop Time 0952    PT Time Calculation (min) 45 min    Activity Tolerance Patient tolerated treatment well    Behavior During Therapy [facility] for tasks assessed/performed             Past Medical History:  Diagnosis Date   Breast cancer ([place])    Hypertension     Past Surgical History:  Procedure Laterality Date   LYMPHADENECTOMY      There were no vitals filed for this visit.   Subjective Assessment - 01/30/21 0907     Subjective I saw you Wednesday. I did the theraband exercises on Thursday then I drove to [place] for 5 hours. I worked Friday morning then I drove back on Friday. Saturday I did nothing. Sunday I still felt tired and I tried a little but my shoulder just felt so weak. I was very tense Monday because I had to go to the dentist and have work done. Now my shoulder hurts much worse from my elbow all the way through my shoulder. My shoulder was doing good with the exercises until I took the drive.    Pertinent History Left breast cancer IDC stage 2 with lumpectomy and bilateral reduction and lift and 0/2 nodes removed on 10/14/19 in [place].  Radiation and chemotherapy completed. 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 Yes    Pain Score 6     Pain Location Shoulder    Pain Orientation Left    Pain Descriptors / Indicators Aching;Constant;Sharp    Pain Type Acute pain    Pain Onset More than a month ago     Pain Frequency Constant    Aggravating Factors  rolling over on it in her sleep    Pain Relieving Factors beign cautious with movement, exercises had been helping    Effect of Pain on Daily Activities has to sleep in certain positions so arm is supported                               [facility] Adult PT Treatment/Exercise - 01/30/21 0001       Shoulder Exercises: Standing   Other Standing Exercises using dual cross cable machine: walkouts with 3 lbs in to flexion, abduction and ER all x 10 reps each on L side with v/c to keep arm stabilized- pt had the most difficulty with ER and had to take several recovery periods in between reps      Shoulder Exercises: Isometric Strengthening   Flexion 5X5"    Extension 5X5"   some discomfort with this   External Rotation 5X5"      Manual Therapy   Soft tissue mobilization in R sidelying to L lateral trunk to lateral edge of lats and serratus as well as rhomboids, levator, and upper traps  using cocoa butter with numerous areas of tightness noted that improved greatly with treatment                          PT Long Term Goals - 01/16/21 1415       PT LONG TERM GOAL #1   Title Pt will improve bil shoulder flexion to at least 150 to demonstrate improved mobility    Baseline 09/19/20-L shoulder flexion 103, abd 57mdue to rotator cuff tear; 01/16/21- R 161 L 105    Time 16    Period Weeks    Status Partially Met      PT LONG TERM GOAL #2   Title Pt will improve bil shoulder abduction to at least 140 to demonstrate improved mobility    Baseline 09/19/20- L sh abd 80; 01/16/21- R 168 L 66    Time 16    Period Weeks    Status Partially Met      PT LONG TERM GOAL #3   Title Pt will decrease shoulder pain to 0/10 at rest    Baseline 01/16/21- pt reports she does not have shoulder pain at rest    Time 16    Period Weeks    Status Achieved      PT LONG TERM GOAL #4   Title Pt will be ind with self MLD for the  left breast lymphedema    Baseline pt has massage machine    Time 16    Period Weeks    Status Achieved      PT LONG TERM GOAL #5   Title Pt will be ind with final HEP for continued mobility    Baseline will return for shoulder strengthening post injection    Time 16    Period Weeks    Status On-going      Additional Long Term Goals   Additional Long Term Goals Yes      PT LONG TERM GOAL #6   Title Pt will be able to sit at the computer and type for long periods of time with no increase in shoulder pain.    Time 6    Period Weeks    Status New    Target Date 02/27/21                   Plan - 01/30/21 0950     Clinical Impression Statement Pt's pain has been worse since the weekend. She reports she had to drive 5 hours on Thursday and then Friday which she feels is the culprit. Pt reports she had been feeling better with the exercises but after driving she got worse. Focused today on pain free strengthening exercises including walk outs on dual cable cross machine and isometric exercises. Pt felt fatigue with these but did not have increased pain. Finished session with soft tissue mobilization to lats and posterior shoulder muscles which were very tight with "knots." This improved completely by end of session and pt reported feeling much better. Will update POC to include 2 visits next week since pt will not be coming the week after Christmas and is not sure due to her job if she will return in January.    PT Frequency 2x / week    PT Duration 6 weeks    PT Treatment/Interventions ADLs/Self Care Home Management;Therapeutic exercise;Patient/family education;Manual lymph drainage;Taping;Manual techniques;Passive range of motion;DME Instruction;Iontophoresis 441mml Dexamethasone;Joint Manipulations    PT Next Visit Plan see how shoulder  felt after last session, how are new exercises doing? STM to lats and serratus    PT Home Exercise Plan Access Code: [email or web address]    Consulted and  Agree with Plan of Care Patient             Patient will benefit from skilled therapeutic intervention in order to improve the following deficits and impairments:  Decreased skin integrity, Increased edema, Decreased knowledge of precautions, Decreased range of motion, Decreased strength, Impaired UE functional use, Postural dysfunction, Pain  Visit Diagnosis: Chronic left shoulder pain  Stiffness of left shoulder, not elsewhere classified  Lymphedema, not elsewhere classified     Problem List Patient Active Problem List   Diagnosis Date Noted   Left rotator cuff tear 10/08/2020    Allyson Sabal Dalton, PT 01/30/2021, 9:59 AM  [facility] @ [address] [place], [place], [address] Phone: [phone]   Fax:  [phone]  Name: Olivia [name] MRN: [id number] Date of Birth: [birth year]

## 2021-02-04 ENCOUNTER — Encounter: Payer: Managed Care, Other (non HMO) | Admitting: Physical Therapy

## 2021-02-06 ENCOUNTER — Other Ambulatory Visit: Payer: Self-pay

## 2021-02-06 ENCOUNTER — Ambulatory Visit: Payer: Managed Care, Other (non HMO)

## 2021-02-06 DIAGNOSIS — G8929 Other chronic pain: Secondary | ICD-10-CM

## 2021-02-06 DIAGNOSIS — M25512 Pain in left shoulder: Secondary | ICD-10-CM | POA: Diagnosis not present

## 2021-02-06 DIAGNOSIS — M25612 Stiffness of left shoulder, not elsewhere classified: Secondary | ICD-10-CM

## 2021-02-06 DIAGNOSIS — I890 Lymphedema, not elsewhere classified: Secondary | ICD-10-CM

## 2021-02-06 NOTE — Therapy (Addendum)
[facility] @ [address] [place], [place], [address] Phone: [phone]   Fax:  [phone]  Physical Therapy Treatment  Patient Details  Name: Olivia [name] MRN: [id number] Date of Birth: 1970-11-13 Referring Provider (PT): Olin Hauser, FNP   Encounter Date: 02/06/2021   PT End of Session - 02/06/21 1453     Visit Number 14    Number of Visits 19    Date for PT Re-Evaluation 02/27/21    PT Start Time 1740    PT Stop Time 1454    PT Time Calculation (min) 47 min    Activity Tolerance Patient tolerated treatment well    Behavior During Therapy [facility] for tasks assessed/performed             Past Medical History:  Diagnosis Date   Breast cancer ([place])    Hypertension     Past Surgical History:  Procedure Laterality Date   LYMPHADENECTOMY      There were no vitals filed for this visit.   Subjective Assessment - 02/06/21 1407     Subjective I felt good after last visit, and feeling much better overall. I am aware of it today, but I could close the car door better, I could wash my underarms in the shower better.  I am getting a shot from Dr. Glennon Mac on Friday in the left shoulder. I have a small RTC tear that may need surgery.    Pertinent History Left breast cancer IDC stage 2 with lumpectomy and bilateral reduction and lift and 0/2 nodes removed on 10/14/19 in [place].  Radiation and chemotherapy completed. 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                               [facility] Adult PT Treatment/Exercise - 02/06/21 0001       Shoulder Exercises: Supine   Horizontal ABduction Strengthening;Both;10 reps    Theraband Level (Shoulder Horizontal ABduction) Level 1 (Yellow)   with PT assist   Other Supine Exercises supine alphabet A-Z    Other Supine Exercises supine rhythmic stabs 2 x 20      Shoulder Exercises: Sidelying   External Rotation AROM;Strengthening;10  reps    External Rotation Weight (lbs) 0#      Shoulder Exercises: Standing   External Rotation Strengthening;Both;10 reps    Theraband Level (Shoulder External Rotation) Level 2 (Red)    Internal Rotation Both;10 reps;Strengthening    Theraband Level (Shoulder Internal Rotation) Level 2 (Red)    Flexion Strengthening;Both;10 reps    Theraband Level (Shoulder Flexion) Level 2 (Red)    Extension Strengthening;Both;10 reps    Theraband Level (Shoulder Extension) Level 2 (Red)    Retraction Strengthening;Both;10 reps    Theraband Level (Shoulder Retraction) Level 2 (Red)    Other Standing Exercises held secondary to pain last visit      Manual Therapy   Soft tissue mobilization in R sidelying to L lateral trunk to lateral edge of lats and serratus as well as rhomboids, levator, and upper traps using cocoa butter with numerous areas of tightness noted that improved greatly with treatment                          PT Long Term Goals - 01/16/21 1415       PT LONG TERM GOAL #1   Title Pt will  improve bil shoulder flexion to at least 150 to demonstrate improved mobility    Baseline 09/19/20-L shoulder flexion 103, abd 18m due to rotator cuff tear; 01/16/21- R 161 L 105    Time 16    Period Weeks    Status Partially Met      PT LONG TERM GOAL #2   Title Pt will improve bil shoulder abduction to at least 140 to demonstrate improved mobility    Baseline 09/19/20- L sh abd 80; 01/16/21- R 168 L 66    Time 16    Period Weeks    Status Partially Met      PT LONG TERM GOAL #3   Title Pt will decrease shoulder pain to 0/10 at rest    Baseline 01/16/21- pt reports she does not have shoulder pain at rest    Time 16    Period Weeks    Status Achieved      PT LONG TERM GOAL #4   Title Pt will be ind with self MLD for the left breast lymphedema    Baseline pt has massage machine    Time 16    Period Weeks    Status Achieved      PT LONG TERM GOAL #5   Title Pt will be ind  with final HEP for continued mobility    Baseline will return for shoulder strengthening post injection    Time 16    Period Weeks    Status On-going      Additional Long Term Goals   Additional Long Term Goals Yes      PT LONG TERM GOAL #6   Title Pt will be able to sit at the computer and type for long periods of time with no increase in shoulder pain.    Time 6    Period Weeks    Status New    Target Date 02/27/21                   Plan - 02/06/21 1457     Clinical Impression Statement Pt felt much better after last treatment. Continued therapeutic exercises to strengthen the left shoulder with theraband, and AROM, then performed soft tissue mobilization. Pt had increased pain with cable activities last time so did not do today. Pt reported feeling much better after treatment today.  She sees MD on Friday for a shoulder injection.  She will have to do paper work so she can take leave in January to continue PT if the MD wants her to    Personal Factors and Comorbidities Fitness;Comorbidity 3+    Comorbidities radiation, chemotherapy history, SLNB, chronic nature    Examination-Activity Limitations Lift;Reach Overhead;Carry    Examination-Participation Restrictions Cleaning;Meal Prep;Yard Work;Occupation    Stability/Clinical Decision Making Evolving/Moderate complexity    Rehab Potential Good    PT Frequency 2x / week    PT Duration 6 weeks    PT Treatment/Interventions ADLs/Self Care Home Management;Therapeutic exercise;Patient/family education;Manual lymph drainage;Taping;Manual techniques;Passive range of motion;DME Instruction;Iontophoresis 4mg /ml Dexamethasone;Joint Manipulations    PT Next Visit Plan continue left shoulder strength, STM lats, serratus, etc    PT Home Exercise Plan Access Code: [email or web address]    Consulted and Agree with Plan of Care Patient             Patient will benefit from skilled therapeutic intervention in order to improve the following  deficits and impairments:  Decreased skin integrity, Increased edema, Decreased knowledge of precautions, Decreased range of motion, Decreased  strength, Impaired UE functional use, Postural dysfunction, Pain  Visit Diagnosis: Chronic left shoulder pain  Stiffness of left shoulder, not elsewhere classified  Lymphedema, not elsewhere classified     Problem List Patient Active Problem List   Diagnosis Date Noted   Left rotator cuff tear 10/08/2020  PHYSICAL THERAPY DISCHARGE SUMMARY  Visits from Start of Care: 14  Current functional level related to goals / functional outcomes: Pt was making progress toward goals but had not achieved all   Remaining deficits: Deficits remain with shoulder ROM and function (ie unable to type without increased pain)   Education / Equipment: HEP instruction, theraband   Patient agrees to discharge. Patient goals were partially met. Patient is being discharged due to not returning since the last visit.   Claris Pong, PT 02/06/2021, 3:02 PM  [facility] @ [address] [place], [place], [address] Phone: [phone]   Fax:  [phone]  Name: Olivia [name] MRN: [id number] Date of Birth: 1970-04-20

## 2021-02-07 ENCOUNTER — Ambulatory Visit: Payer: Managed Care, Other (non HMO) | Admitting: Nurse Practitioner

## 2021-02-07 NOTE — Progress Notes (Signed)
° °   Olivia [name] D.[place] [facility] Phone: [phone]   Assessment and Plan:     1. Chronic pain of both shoulders 2. Nontraumatic incomplete tear of left rotator cuff -Chronic with exacerbation, subsequent visit - History of rotator cuff tear causing intermittent flares of pain - Patient has received some benefit from CSI and PT, however has not done both together - Patient elected for repeat subacromial CSI.  Tolerated well per note below - Continue PT - Continue Tylenol/NSAIDs as needed for pain control - Patient says that she may need paperwork starting the new year stating that she needs PT.  She will drop off or fax [place] paperwork for [place] to fill out   Pertinent previous records reviewed include none   Follow Up: 4 weeks for reevaluation   Subjective:   I, Pincus Badder, am serving as a Education administrator for Doctor Glennon Mac  Chief Complaint: shoulder injection   HPI:   11/05/20 Here for injections. No change in shoulder pain.  Patient experienced chronic shoulder pain with worsening since 02/2020 without new injury or mechanism.  She had a recent MRI and has been in discussions with Dr. Tamala Julian about how to proceed.  She has been wearing establishing care with orthopedic surgery versus conservative therapy and wishes to continue with conservative therapy at this time including PT and CSI.   12/13/20 Patient states that she felt really good for a few weeks after the injection, but the pain has come back slowly with the soreness and pain in left shoulder   02/08/2021 Patient states no change in shoulder its uncomfortable to sleep    Relevant Historical Information: None pertinent  Additional pertinent review of systems negative.   Current Outpatient Medications:    cetirizine (ZYRTEC ALLERGY) 10 MG tablet, Take 1 tablet (10 mg total) by mouth daily., Disp: 30 tablet, Rfl: 2   lidocaine (XYLOCAINE) 2 %  solution, Use as directed 15 mLs in the mouth or throat as needed for mouth pain., Disp: 100 mL, Rfl: 0   losartan-hydrochlorothiazide (HYZAAR) 100-25 MG tablet, Take 1 tablet by mouth daily., Disp: , Rfl:    Objective:     Vitals:   02/08/21 1136  BP: 122/70  Pulse: 75  SpO2: 99%  Weight: 290 lb (131.5 kg)  Height: 5\' 9"  (1.753 m)      Body mass index is 42.83 kg/m.    Physical Exam:   Gen: Appears well, nad, nontoxic and pleasant Neuro:sensation intact, strength is 5/5 with df/pf/inv/ev, muscle tone wnl Skin: no suspicious lesion or defmority Psych: A&O, appropriate mood and affect   Left shoulder: no deformity, swelling or muscle wasting No scapular winging FF 120, abd 120, int 15, ext 70 TTP trapezius NTTP over the [place], clavicle, ac, coracoid, biceps groove, humerus, deltoid, cervical spine Positive empty can Negative Spurling's test    Electronically signed by:  Olivia [name] D.[facility] 11:55 AM 02/08/21

## 2021-02-08 ENCOUNTER — Ambulatory Visit (INDEPENDENT_AMBULATORY_CARE_PROVIDER_SITE_OTHER): Payer: Managed Care, Other (non HMO) | Admitting: Sports Medicine

## 2021-02-08 ENCOUNTER — Other Ambulatory Visit: Payer: Self-pay

## 2021-02-08 VITALS — BP 122/70 | HR 75 | Ht 69.0 in | Wt 290.0 lb

## 2021-02-08 DIAGNOSIS — M25511 Pain in right shoulder: Secondary | ICD-10-CM

## 2021-02-08 DIAGNOSIS — M75112 Incomplete rotator cuff tear or rupture of left shoulder, not specified as traumatic: Secondary | ICD-10-CM

## 2021-02-08 DIAGNOSIS — G8929 Other chronic pain: Secondary | ICD-10-CM | POA: Diagnosis not present

## 2021-02-08 DIAGNOSIS — M25512 Pain in left shoulder: Secondary | ICD-10-CM | POA: Diagnosis not present

## 2021-02-08 NOTE — Patient Instructions (Addendum)
Good to see you  Continue PT Continue tylenol and NSAIDS as needed  4 week follow up

## 2021-02-20 ENCOUNTER — Encounter: Payer: Self-pay | Admitting: Sports Medicine

## 2021-03-06 ENCOUNTER — Encounter: Payer: Self-pay | Admitting: Sports Medicine

## 2021-03-08 ENCOUNTER — Ambulatory Visit: Payer: Managed Care, Other (non HMO) | Admitting: Sports Medicine

## 2021-05-03 ENCOUNTER — Ambulatory Visit (HOSPITAL_BASED_OUTPATIENT_CLINIC_OR_DEPARTMENT_OTHER): Payer: Commercial Managed Care - POS | Admitting: Family Nurse Practitioner

## 2021-05-16 ENCOUNTER — Ambulatory Visit (HOSPITAL_BASED_OUTPATIENT_CLINIC_OR_DEPARTMENT_OTHER): Payer: Commercial Managed Care - POS | Admitting: Family Nurse Practitioner

## 2021-05-16 ENCOUNTER — Ambulatory Visit: Payer: Commercial Managed Care - POS | Admitting: Internal Medicine

## 2021-05-16 ENCOUNTER — Other Ambulatory Visit: Payer: Commercial Managed Care - POS

## 2021-06-12 ENCOUNTER — Encounter (HOSPITAL_BASED_OUTPATIENT_CLINIC_OR_DEPARTMENT_OTHER): Payer: Self-pay | Admitting: Family Nurse Practitioner

## 2021-06-12 ENCOUNTER — Encounter: Payer: Self-pay | Admitting: Internal Medicine

## 2021-06-28 ENCOUNTER — Other Ambulatory Visit: Payer: Commercial Managed Care - POS

## 2021-06-28 ENCOUNTER — Ambulatory Visit: Payer: Commercial Managed Care - POS | Admitting: Internal Medicine

## 2021-07-22 ENCOUNTER — Other Ambulatory Visit (INDEPENDENT_AMBULATORY_CARE_PROVIDER_SITE_OTHER): Payer: Self-pay | Admitting: Family Medicine

## 2021-07-22 DIAGNOSIS — M62838 Other muscle spasm: Secondary | ICD-10-CM

## 2021-08-27 ENCOUNTER — Encounter: Payer: Self-pay | Admitting: *Deleted

## 2021-08-28 ENCOUNTER — Encounter: Payer: Self-pay | Admitting: Diagnostic Neuroimaging

## 2021-08-28 ENCOUNTER — Ambulatory Visit: Payer: Managed Care, Other (non HMO) | Admitting: Diagnostic Neuroimaging

## 2021-08-28 VITALS — BP 122/83 | HR 94 | Ht 69.0 in | Wt 293.5 lb

## 2021-08-28 DIAGNOSIS — T451X5A Adverse effect of antineoplastic and immunosuppressive drugs, initial encounter: Secondary | ICD-10-CM

## 2021-08-28 DIAGNOSIS — G620 Drug-induced polyneuropathy: Secondary | ICD-10-CM | POA: Diagnosis not present

## 2021-08-28 NOTE — Patient Instructions (Signed)
  Painful neuropathy treatment options: -consider duloxetine 30-'60mg'$  daily, amitriptyline 25-'50mg'$  at bedtime -capsaicin cream, lidocaine patch / cream, alpha-lipoic acid '600mg'$  daily

## 2021-08-28 NOTE — Progress Notes (Signed)
[facility]  PATIENT: Olivia [name] DOB: [birth year]  REFERRING CLINICIAN: Pahwani, Michell Heinrich, MD HISTORY FROM: patient REASON FOR VISIT: new consult   HISTORICAL  CHIEF COMPLAINT:  Chief Complaint  Patient presents with   New Patient (Initial Visit)    Pt reports have neuropathy since chemo treatment in 2021. In her legs, feet, toes and some times in her fingers. Numbness in toes, sharp pain in feet, legs are have achy pain. She states her feet be really cold which causes them to hurt more. Room 6 with mother    HISTORY OF PRESENT ILLNESS:   51 year old female here for evaluation of chemotherapy neuropathy.  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  She underwent chemotherapy, lumpectomy and radiation treatments.  Chemotherapy lasted from May to July 2021.  Previously upon completion of chemotherapy she noticed severe burning, tingling, painful sensation in her toes, feet, up to her thighs and into her hands.  She was tried on gabapentin for couple weeks but then had some side effect of swelling in her eyes and this was discontinued.  She was recommended to try duloxetine but patient never started because of concern of potential side effects.  Since that time symptoms are slightly improved but overall plateaued.  Outside oncology notes from 01/23/2020: "06/23/2019 - Chemotherapy  CARBOplatin (PARAPLATIN) 225 mg in sodium chloride 0.9 % 307.5 mL chemo infusion, 225 mg, Intravenous, Once, 2 of 2 cycles Administration: 225 mg (06/23/2019), 225 mg (06/30/2019), 225 mg (07/07/2019), 225 mg (07/14/2019) PACLitaxel (TAXOL) 210 mg in sodium chloride 0.9 % 320 mL chemo infusion, 80 mg/m2 = 210 mg, Intravenous, Once, 4 of 4 cycles Dose modification: 64 mg/m2 (80 % of original dose 80 mg/m2, Cycle 4, Reason: Dose Not Tolerated) Administration: 210 mg (06/23/2019), 210 mg (06/30/2019), 210 mg (07/07/2019), 210  mg (07/14/2019), 210 mg (07/28/2019), 210 mg (08/04/2019), 210 mg (08/11/2019), 210 mg (08/18/2019), 210 mg (08/25/2019), 210 mg (09/01/2019), 168 mg (09/15/2019)"   REVIEW OF SYSTEMS: Full 14 system review of systems performed and negative with exception of: as per HPI.  ALLERGIES: Allergies  Allergen Reactions   Gabapentin     Other reaction(s): Facial Swelling Eye swelling   Valsartan Other (See Comments)    Other reaction(s): Other achiness    Penicillins Hives    Symptoms occurred as child Onset date: 07-28-2004     HOME MEDICATIONS: Outpatient Medications Prior to Visit  Medication Sig Dispense Refill   losartan-hydrochlorothiazide (HYZAAR) 100-25 MG tablet Take 1 tablet by mouth daily.     cetirizine (ZYRTEC ALLERGY) 10 MG tablet Take 1 tablet (10 mg total) by mouth daily. 30 tablet 2   lidocaine (XYLOCAINE) 2 % solution Use as directed 15 mLs in the mouth or throat as needed for mouth pain. 100 mL 0   No facility-administered medications prior to visit.    PAST MEDICAL HISTORY: Past Medical History:  Diagnosis Date   Breast cancer ([place])    left   Hypertension    Lymphedema    Neuropathy    LE   Torn rotator cuff    left    PAST SURGICAL HISTORY: Past Surgical History:  Procedure Laterality Date   ABDOMINAL HYSTERECTOMY     BREAST LUMPECTOMY Left    LYMPHADENECTOMY     TUBAL LIGATION      FAMILY HISTORY: Family History  Problem Relation Age of Onset   Hypertension Mother    CAD Mother  Diabetes Father    Hypertension Father    Hypertension Sister     SOCIAL HISTORY: Social History   Socioeconomic History   Marital status: Legally Separated    Spouse name: Not on file   Number of children: 2   Years of education: Not on file   Highest education level: Some college, no degree  Occupational History    Comment: Statistician  Tobacco Use   Smoking status: Never   Smokeless tobacco: Never  Vaping Use   Vaping Use: Not on file   Substance and Sexual Activity   Alcohol use: Yes    Comment: social   Drug use: Yes    Types: Marijuana   Sexual activity: Not on file  Other Topics Concern   Not on file  Social History Narrative   Caffetine intake once a month a cup of coffee   Right handed   Social Determinants of Health   Financial Resource Strain: Not on file  Food Insecurity: Not on file  Transportation Needs: Not on file  Physical Activity: Not on file  Stress: Not on file  Social Connections: Not on file  Intimate Partner Violence: Not on file     PHYSICAL EXAM  GENERAL EXAM/CONSTITUTIONAL: Vitals:  Vitals:   08/28/21 1052  BP: 122/83  Pulse: 94  Weight: 293 lb 8 oz (133.1 kg)  Height: '5\' 9"'$  (1.753 m)   Body mass index is 43.34 kg/m. Wt Readings from Last 3 Encounters:  08/28/21 293 lb 8 oz (133.1 kg)  02/08/21 290 lb (131.5 kg)  12/13/20 284 lb (128.8 kg)   Patient is in no distress; well developed, nourished and groomed; neck is supple  CARDIOVASCULAR: Examination of carotid arteries is normal; no carotid bruits Regular rate and rhythm, no murmurs Examination of peripheral vascular system by observation and palpation is normal  EYES: Ophthalmoscopic exam of optic discs and posterior segments is normal; no papilledema or hemorrhages No results found.  MUSCULOSKELETAL: Gait, strength, tone, movements noted in Neurologic exam below  NEUROLOGIC: MENTAL STATUS:      No data to display         awake, alert, oriented to person, place and time recent and remote memory intact normal attention and concentration language fluent, comprehension intact, naming intact fund of knowledge appropriate  CRANIAL NERVE:  2nd - no papilledema on fundoscopic exam 2nd, 3rd, 4th, 6th - pupils equal and reactive to light, visual fields full to confrontation, extraocular muscles intact, no nystagmus 5th - facial sensation symmetric 7th - facial strength symmetric 8th - hearing intact 9th -  palate elevates symmetrically, uvula midline 11th - shoulder shrug symmetric 12th - tongue protrusion midline  MOTOR:  normal bulk and tone, full strength in the BUE, BLE; EXCEPT DECR IN LEFT SHOULDER  SENSORY:  normal and symmetric to light touch, pinprick, temperature, vibration; EXCEPT DECR IN FINGERS AND TOES  COORDINATION:  finger-nose-finger, fine finger movements normal  REFLEXES:  deep tendon reflexes TRACE and symmetric  GAIT/STATION:  narrow based gait; ANTALGIC GAIT     DIAGNOSTIC DATA (LABS, IMAGING, TESTING) - I reviewed patient records, labs, notes, testing and imaging myself where available.  No results found for: "WBC", "HGB", "HCT", "MCV", "PLT" No results found for: "NA", "K", "CL", "CO2", "GLUCOSE", "BUN", "CREATININE", "CALCIUM", "PROT", "ALBUMIN", "AST", "ALT", "ALKPHOS", "BILITOT", "GFRNONAA", "GFRAA" No results found for: "CHOL", "HDL", "LDLCALC", "LDLDIRECT", "TRIG", "CHOLHDL" No results found for: "HGBA1C" No results found for: "VITAMINB12" No results found for: "TSH"  04/27/20  Ref Range & Units 1 yr ago   Hemoglobin A1C 4.6 - 5.9 % 4.8    01/23/20  Ref Range & Units 1 yr ago   Vitamin B-12 211 - 911 pg/mL 399      ASSESSMENT AND PLAN  51 y.o. year old female here with chemotherapy neuropathy since July 2021.   Dx:  1. Chemotherapy-induced neuropathy (HCC)     PLAN:  Painful neuropathy treatment options: -consider duloxetine 30-'60mg'$  daily, amitriptyline 25-'50mg'$  at bedtime -capsaicin cream, lidocaine patch / cream, alpha-lipoic acid '600mg'$  daily  Return for pending if symptoms worsen or fail to improve, return to PCP.    Penni Bombard, MD 2/37/6283, 15:17 AM Certified in Neurology, Neurophysiology and Neuroimaging  [facility] [address] [place], [place] [address] [phone]

## 2021-08-31 IMAGING — NM NM BONE WHOLE BODY
4 series · 4 of 4 positions shown · non-contrast
Comparison: None

Radiographic correlation: None

CLINICAL DATA: Breast cancer diagnosed in 0303 post chemotherapy
and radiation therapy, BILATERAL shoulder and thigh pain

EXAM:
NUCLEAR MEDICINE WHOLE BODY BONE SCAN
TECHNIQUE: Whole body anterior and posterior images were obtained approximately
3 hours after intravenous injection of radiopharmaceutical.
RADIOPHARMACEUTICALS:  21.2 mCi Xechnetium-RRm MDP IV

[Series 1: whole body · 2.66mm/px · 1 of 1 slices shown (1 of 2)]
[im 1/1]
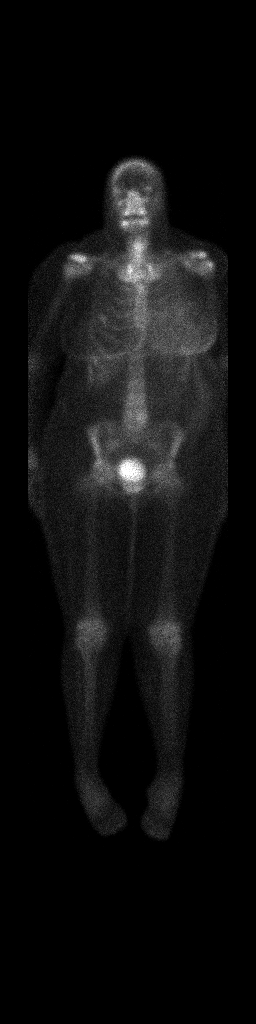

[Series 1: whole body · 2.66mm/px · 1 of 1 slices shown (2 of 2)]
[im 1/1]
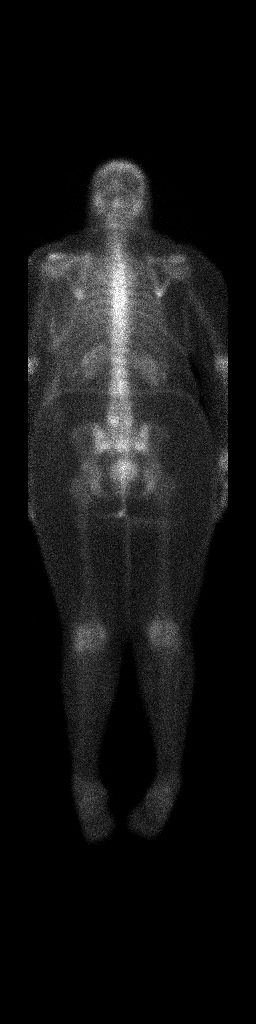

[Series 1: wbr_bone_60 whole body · 2.66mm/px · 1 of 1 slices shown (1 of 2)]
[im 1/1]
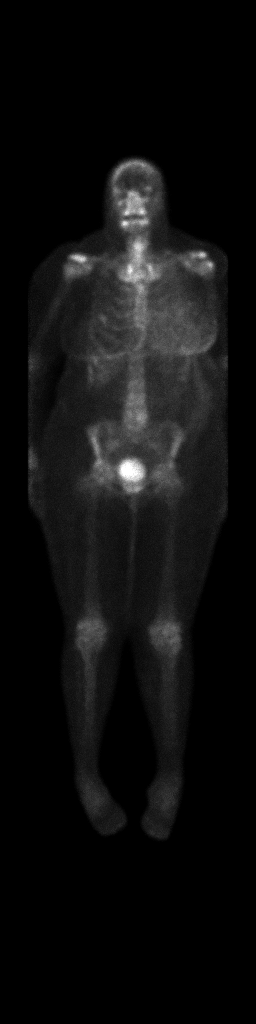

[Series 1: wbr_bone_60 whole body · 2.66mm/px · 1 of 1 slices shown (2 of 2)]
[im 1/1]
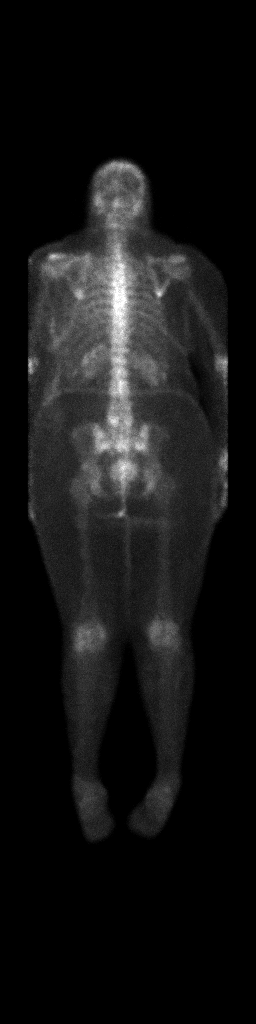

[4 of 4 positions shown; findings below may reference images not displayed]

FINDINGS: Uptake at shoulders, sternoclavicular joints elbows, RIGHT wrist,
hips, knees, typically degenerative.

Uptake bilaterally at lower lumbar spine at L4-L5, likely
degenerative.

Single focus of abnormal tracer uptake at anterior RIGHT rib
approximately fifth, nonspecific, could be due to trauma or
metastatic disease, recommend radiographic correlation.

No additional sites of concerning osseous tracer uptake are seen.

Increased tracer uptake throughout LEFT breast suspect related to
edema and prior therapy.

Otherwise expected urinary tract and soft tissue distribution of
tracer.
IMPRESSION: Nonspecific tracer uptake at anterior RIGHT fifth rib, could be due
to trauma or metastatic disease, recommend radiographic correlation.

No additional sites of concerning osseous tracer uptake identified.

## 2021-10-31 ENCOUNTER — Other Ambulatory Visit: Payer: Self-pay

## 2021-10-31 DIAGNOSIS — Z171 Estrogen receptor negative status [ER-]: Secondary | ICD-10-CM

## 2021-11-01 ENCOUNTER — Encounter: Payer: Self-pay | Admitting: Internal Medicine

## 2021-11-01 ENCOUNTER — Other Ambulatory Visit (INDEPENDENT_AMBULATORY_CARE_PROVIDER_SITE_OTHER): Payer: Self-pay | Admitting: Family Nurse Practitioner

## 2021-11-01 ENCOUNTER — Ambulatory Visit: Payer: Commercial Managed Care - POS | Attending: Internal Medicine | Admitting: Internal Medicine

## 2021-11-01 ENCOUNTER — Other Ambulatory Visit (FREE_STANDING_LABORATORY_FACILITY): Payer: Commercial Managed Care - POS

## 2021-11-01 VITALS — BP 108/75 | HR 88 | Temp 98.0°F | Resp 20 | Ht 69.0 in | Wt 298.1 lb

## 2021-11-01 DIAGNOSIS — C50412 Malignant neoplasm of upper-outer quadrant of left female breast: Secondary | ICD-10-CM

## 2021-11-01 DIAGNOSIS — T451X5A Adverse effect of antineoplastic and immunosuppressive drugs, initial encounter: Secondary | ICD-10-CM

## 2021-11-01 DIAGNOSIS — Z171 Estrogen receptor negative status [ER-]: Secondary | ICD-10-CM

## 2021-11-01 DIAGNOSIS — G620 Drug-induced polyneuropathy: Secondary | ICD-10-CM

## 2021-11-01 LAB — HEPATIC FUNCTION PANEL
ALT: 12 U/L (ref 0–55)
AST (SGOT): 15 U/L (ref 5–41)
Albumin/Globulin Ratio: 0.8 — ABNORMAL LOW (ref 0.9–2.2)
Albumin: 3.6 g/dL (ref 3.5–5.0)
Alkaline Phosphatase: 95 U/L (ref 37–117)
Bilirubin Direct: 0.2 mg/dL (ref 0.0–0.5)
Bilirubin Indirect: 0.2 mg/dL (ref 0.2–1.0)
Bilirubin, Total: 0.4 mg/dL (ref 0.2–1.2)
Globulin: 4.3 g/dL — ABNORMAL HIGH (ref 2.0–3.6)
Protein, Total: 7.9 g/dL (ref 6.0–8.3)

## 2021-11-01 LAB — CBC AND DIFFERENTIAL
Absolute NRBC: 0 10*3/uL (ref 0.00–0.00)
Basophils Absolute Automated: 0.03 10*3/uL (ref 0.00–0.08)
Basophils Automated: 0.5 %
Eosinophils Absolute Automated: 0.06 10*3/uL (ref 0.00–0.44)
Eosinophils Automated: 0.9 %
Hematocrit: 40.6 % (ref 34.7–43.7)
Hgb: 13.1 g/dL (ref 11.4–14.8)
Immature Granulocytes Absolute: 0.02 10*3/uL (ref 0.00–0.07)
Immature Granulocytes: 0.3 %
Instrument Absolute Neutrophil Count: 3.58 10*3/uL (ref 1.10–6.33)
Lymphocytes Absolute Automated: 2.25 10*3/uL (ref 0.42–3.22)
Lymphocytes Automated: 35.1 %
MCH: 28.9 pg (ref 25.1–33.5)
MCHC: 32.3 g/dL (ref 31.5–35.8)
MCV: 89.4 fL (ref 78.0–96.0)
MPV: 10.2 fL (ref 8.9–12.5)
Monocytes Absolute Automated: 0.47 10*3/uL (ref 0.21–0.85)
Monocytes: 7.3 %
Neutrophils Absolute: 3.58 10*3/uL (ref 1.10–6.33)
Neutrophils: 55.9 %
Nucleated RBC: 0 /100 WBC (ref 0.0–0.0)
Platelets: 190 10*3/uL (ref 142–346)
RBC: 4.54 10*6/uL (ref 3.90–5.10)
RDW: 14 % (ref 11–15)
WBC: 6.41 10*3/uL (ref 3.10–9.50)

## 2021-11-01 LAB — HEMOLYSIS INDEX: Hemolysis Index: 16 Index (ref 0–24)

## 2021-11-01 NOTE — Progress Notes (Signed)
[facility]  [address] [name]  [phone]      [name] Office  [phone]  Visit Date: 11/01/2021    Chief Complaint   Patient presents with    Follow-up     Continuation of managed care for breast cancer 0/10  discomfort          HISTORY OF PRESENT ILLNESS:  Still dealing with neuropathy. Saw neurology who offered duloxetine, gabapentin etc. Patient is not interested in pharmacological approach. Started B12 and that helped.     Oncology History Overview Note   Physician Requesting Consult: de Lawson Radar, Dairl Ponder, MD    Breast Surgeon: Orlene Och, MD  Radiation Oncologist:  Plastic Surgeon:   PCP: Satira Mccallum, MD      Leslie [name] is a 51 y.o.-year-old female with newly diagnosed IDC of the left breast.   She presented with abnormal mammogram.     Bilateral screening mammogram 02/2019 showed left breast 3.1 x 2 cm hypoechoic mass and midly enlarged axillary node abnormality.     [facility] left breast mass 2 OC and left axillary node 03/07/2019 [facility]) showed:   Breast: IDC, G3, ER 0%, PR 0%, Her 2 neu negative (1+) by IHC, Ki 67 80%  B) Lymph node, left axillary: benign-appearing lymph node.    Bilateral breast MRI and Genetic consult ordered. Path review pending.       Breast Cancer Risk Factors:  Age at Menarche:  36  Age at 81st FTP:  35    Still menstruating?:  No   Last menses: 2008   Hysterectomy: Yes 2008 TAH+ left oophorectomy   Estrogen Therapy:          Oral Contraceptives: No           Hormone Replacement Therapy:  No         Reproductive Assistance Therapies:  No  Family History of Cancer:            Breast:   Yes maternal aunt age 48, maternal cousin age 99, paternal cousin age 3,       Ashkenazi Jewish Ancestry: No     Genetic testing: pending       Denies F/C/N/V/diarrhea. No CP/SOB. No HA/visual changes/focal weakness.        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, estrogen receptor negative     04/14/2019 - 06/09/2019 Chemotherapy    cyclophosphamide (CYTOXAN) 1,500 mg in sodium chloride 0.9 % 360 mL chemo infusion, 1,554 mg, Intravenous, Once, 4 of 4 cycles  Administration: 1,500 mg (04/14/2019), 1,500 mg (04/28/2019), 1,500 mg (05/12/2019), 1,500 mg (06/09/2019)  pegfilgrastim (NEULASTA) injection 6 mg, 6 mg, Subcutaneous, Once, 2 of 2 cycles  Administration: 6 mg (04/14/2019), 6 mg (04/29/2019)  pegfilgrastim (NEULASTA) delivery kit 6 mg, 6 mg, Subcutaneous, Once, 3 of 3 cycles  Administration: 6 mg (04/28/2019), 6 mg (05/12/2019), 6 mg (06/09/2019)  DOXOrubicin (ADRIAMYCIN) 155 mg in sodium chloride 0.9 % 362.5 mL chemo infusion, 60 mg/m2 = 155 mg, Intravenous, Once, 4 of 4 cycles  Administration: 155 mg (04/14/2019), 155 mg (04/28/2019), 155 mg (05/12/2019), 155 mg (06/09/2019)     06/23/2019 - 09/15/2019 Chemotherapy    CARBOplatin (PARAPLATIN) 225 mg in sodium chloride 0.9 % 307.5 mL chemo infusion, 225 mg, Intravenous, Once, 2 of 2 cycles  Administration: 225 mg (06/23/2019), 225 mg (06/30/2019), 225 mg (07/07/2019), 225 mg (07/14/2019)  PACLitaxel (TAXOL) 210 mg in sodium chloride 0.9 % 320 mL chemo infusion, 80 mg/m2 = 210 mg, Intravenous, Once, 4 of 4 cycles  Dose modification: 64 mg/m2 (80 % of original dose 80 mg/m2, Cycle 4, Reason: Dose Not Tolerated)  Administration: 210 mg (06/23/2019), 210 mg (06/30/2019), 210 mg (07/07/2019), 210 mg (07/14/2019), 210 mg (07/28/2019), 210 mg (08/04/2019), 210 mg (08/11/2019), 210 mg (08/18/2019), 210 mg (08/25/2019), 210 mg (09/01/2019), 168 mg (09/15/2019)     10/14/2019 Surgery    S/p left lumpectomy/SLNBx     10/31/2019 Cancer Staged    Staging form: Breast, AJCC 8th Edition  - Clinical stage from 10/31/2019: Stage IIB (cT2, cN0, cM0, G3, ER-, PR-, HER2-) - Signed by [name], MD on 10/31/2019       10/31/2019 Cancer Staged    Staging form: Breast, AJCC 8th Edition  - Pathologic stage from  10/31/2019: No Stage Recommended (ypT0, pN0(sn), cM0) - Signed by [name], MD on 10/31/2019       12/05/2019 - 01/09/2020 Radiation    Radiation         Cancer Staging   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, estrogen receptor negative  Staging form: Breast, AJCC 8th Edition  - Clinical stage from 10/31/2019: Stage IIB (cT2, cN0, cM0, G3, ER-, PR-, HER2-) - Signed by [name], MD on 10/31/2019  Stage prefix: Initial diagnosis  Method of lymph node assessment: Clinical  Histologic grading system: 3 grade system  Stage used in treatment planning: Yes  National guidelines used in treatment planning: Yes  Type of national guideline used in treatment planning: NCCN  - Pathologic stage from 10/31/2019: No Stage Recommended (ypT0, pN0(sn), cM0) - Signed by [name], MD on 10/31/2019  Stage prefix: Post-therapy  Response to neoadjuvant therapy: Complete response  Method of lymph node assessment: Sentinel lymph node biopsy  Stage used in treatment planning: Yes  National guidelines used in treatment planning: Yes  Type of national guideline used in treatment planning: NCCN      Past Medical History:   Diagnosis Date    Anemia     iron deficient, recent chemo//takes po iron supplement    Clot     mediport site r chest, on elliquis    Fibroid     Gastroesophageal reflux disease     indigestion while on chemo    Herpes simplex without mention of complication 2003    possible recurrance in 2013//no issuses since    Hypertension     well controlled w/ medication    Lymphedema of extremity 11/03/2017    L foot    Major depressive disorder with single episode, in full remission     Malignant neoplasm of breast 02/2019    L breast ca, s/p chemo 08/2019, no radiation    Neurologic disorder     brachial neuritis    Neuropathy     recednt chemo/hands/feet    Obesity     Vitamin D deficiency      Past Surgical History:   Procedure Laterality Date    BIOPSY, SENTINEL NODE Left 10/14/2019     Procedure: BIOPSY, SENTINEL NODE;  Surgeon: Boykin Peek, MD;  Location: [facility];  Service: General;  Laterality: Left;    BREAST SURGERY  10/14/19    BREAST, LUMPECTOMY Left 10/14/2019    Procedure: BREAST, LUMPECTOMY, WITH MAG SEED LOCALIZATION;  Surgeon:  Edmiston, Rochele Raring, MD;  [facility]: [name];  Service: General;  Laterality: Left;    HYSTERECTOMY  2008    fibroids, readmitted for pelvic abscess formation POD#3    MAMMOPLASTY, REDUCTION (MEDICAL) Right 10/14/2019    Procedure: MAMMOPLASTY, REDUCTION (MEDICAL);  Surgeon: Felipe Drone, MD;  Location: [name];  Service: Plastics;  Laterality: Right;    MAMMOPLASTY/ONCOPLASTY Left 10/14/2019    Procedure: MAMMOPLASTY/ONCOPLASTY;  Surgeon: Felipe Drone, MD;  Location: [facility];  Service: Plastics;  Laterality: Left;  Right Wt: 1318g  Left Wt: 1319g    MEDIPORT CHECK Right 06/08/2019    Procedure: MEDIPORT CHECK;  Surgeon: Rex Kras, MD;  [facility];  Service: Interventional Radiology;  Laterality: Right;    MEDIPORT PLACEMENT N/A 04/08/2019    Procedure: MEDIPORT PLACEMENT;  Surgeon: Pollyann Kennedy, MD;  [facility];  Service: Interventional Radiology;  Laterality: N/A;    OTHER SURGICAL HISTORY  05/04/2015    Obalon balloon - have 3 balloons placed in stomach for weight loss, Dr. Georgann Housekeeper    REMOVAL, MEDIPORT Right 10/14/2019    Procedure: REMOVAL, MEDIPORT;  Surgeon: Boykin Peek, MD;  Location:  MAIN OR;  Service: General;  Laterality: Right;    SALPINGOOPHORECTOMY Right 2008    RSO with hysterectomy    TUBAL LIGATION  02/17/1997     Social History     Socioeconomic History    Marital status: Legally Separated     Spouse name: None    Number of children: None    Years of education: None    Highest education level: None   Occupational History    None   Tobacco Use    Smoking status: Never    Smokeless tobacco: Never   Vaping Use    Vaping Use: Never used   Substance and Sexual Activity     Alcohol use: Not Currently     Alcohol/week: 0.0 standard drinks of alcohol    Drug use: No     Comment: CBD oil/last use month ago    Sexual activity: Not Currently     Partners: Female     Birth control/protection: None   Other Topics Concern    None   Social History Narrative    Has elliptical machine        Lives with boyfriend, daughter home        Son died on 01/19/19 in CCU            Mom staying with her, good support system 05/30/2019        Moved to [facility] 04/2020     Social Determinants of Health     Financial Resource Strain: Not on file   Food Insecurity: Not on file   Transportation Needs: Not on file   Physical Activity: Insufficiently Active (01/10/2019)    Exercise Vital Sign     Days of Exercise per Week: 7 days     Minutes of Exercise per Session: 20 min   Stress: Not on file   Social Connections: Not on file   Intimate Partner Violence: Not on file   Housing Stability: Not on file      Family History   Problem Relation Age of Onset    Hypertension Mother     Coronary artery disease Mother     Heart disease Mother     Hyperlipidemia Mother     Hypertension Father  Diabetes Father     Other Father         cardiac catheterization    Hypertension Sister     Breast cancer Maternal Aunt     Uterine cancer Maternal Aunt     Vaginal cancer Maternal Aunt     Breast cancer Other         maternal cousin    Ovarian cancer Neg Hx        Allergies   Allergen Reactions    Gabapentin Facial Swelling     Eye swelling    Valsartan Other (See Comments)     achiness    Penicillins Hives     Symptoms occurred as child  Onset date: 07-28-2004     has a current medication list which includes the following prescription(s): losartan-hydrochlorothiazide and tizanidine.        REVIEW OF SYSTEMS: 14 pt ROS reviewed with the patient and negative except for as documented in the HPI. All other systems reviewed.      PHYSICAL EXAM:  BP 108/75 (BP Site: Right arm, Patient Position: Sitting, Cuff Size: Large)   Pulse 88    Temp 98 F (36.7 C) (Oral)   Resp 20   Ht 1.753 m (5\' 9" )   Wt 135.2 kg (298 lb 1.6 oz)   SpO2 98%   BMI 44.02 kg/m         Performance Status:  1 - Restricted in physically strenuous activity but ambulatory and able to carry out work of a light or sedentary nature, e.g., light house work      General Appearance: Alert, cooperative, no distress, appears stated age  Eyes: conjunctiva/corneas clear bilaterally  Head:  Normocephalic, without obvious abnormality, atraumatic  Neck: Supple, symmetrical, trachea midline, no adenopathy  Lungs: Clear to auscultation bilaterally, respirations unlabored  Chest Wall: No tenderness or deformity  Heart: Regular rate and rhythm, S1 and S2 normal, no murmur, rub or gallop  Breast Exam: Left partial mastectomy with large amount of edema and normal right breast  Abdomen: Soft, non-tender, no masses, no organomegaly  Extremities: Extremities normal, atraumatic, no cyanosis or edema  Lymph Nodes:  Cervical, supraclavicular, and axillary nodes normal      Labs Results:  Reviewed in epic today  Lab Results   Component Value Date    WBC 6.41 11/01/2021    HGB 13.1 11/01/2021    HCT 40.6 11/01/2021    MCV 89.4 11/01/2021    PLT 190 11/01/2021     Lab Results   Component Value Date    CREAT 1.1 04/27/2020    BUN 16.0 04/27/2020    NA 138 04/27/2020    K 4.0 04/27/2020    CL 100 04/27/2020    CO2 29 04/27/2020     Lab Results   Component Value Date    ALT 12 11/01/2021    AST 15 11/01/2021    ALKPHOS 95 11/01/2021    BILITOTAL 0.4 11/01/2021       Imaging Results: reviewed bone scan 05/21/2020, mammogram 04/25/2020 and ECHO 06/14/2020       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      C1D1 AC on 04/14/2019.   C4D1 AC on 06/09/2019.   Started carbotaxol on 06/23/2019. Was unable to tolerate.   Dropped carbo and continued with single agent paclitaxel starting with C2D8. Patient decided to stop chemotherapy with once one infusion left (09/15/2019) as she developed severe neuropathic pain hands and feet.      Followed by cardio-oncology.    Baseline ECHO 04/12/2019 showed EF 66% and GLS -19%.  ECHO post C#2 [facility] 05/04/2019 showed EF 64% and GLS -19%.  Echo post C4 AC- 4/19/ 21 showed EF 64% GLS -19. %   ECHO post 12 m AC -06/14/2020 showed EF 65%. Not able to estimated GLS.   Genetic testing recommended but not completed. Encouraged.        S/p left lumpectomy/SLNBx 10/14/2019. Final pathology showed ypT0, ypNo.   Completed radiation from 11/29/2019 to 01/09/2020.   BDM scheduled today   CBC and LFTs today wnl.   Moved to [place] -- looking for new med onc -- referred to Dr Salvadore Dom at [facility] maintenance   - annual CBC and mammogram   - DXA q 2 years   - s/p hysterectomy + one ovary removed in 2008   - colonoscopy 08/2021. Normal. Told to repeat in 5 years as FH polyps.     Encouraged weight loss and regular exercise      No RTC. Will move her onc care to [place]         CIPN:   Could try B complex, acupuncture, massage and PT for CIPN     [name], MD     Total time spent on encounter  including face to face interaction with patient and non face to face activities including preparing to see the patient (review imaging and pathology records), obtaining and reviewing the separately obtained history, ordering medication, test, procedure, referring the communicated with other healthcare professionals, documenting clinical information sent electronically, independently interpreting results and communicating results with patient, family, caregiver, care conversation on the date of  service was equal to 40 minutes.

## 2021-11-21 ENCOUNTER — Telehealth: Payer: Self-pay | Admitting: Hematology and Oncology

## 2021-11-21 NOTE — Telephone Encounter (Signed)
Scheduled appt per 10/4 referral. Pt is aware of appt date and time. Pt is aware to arrive 15 mins prior to appt time and to bring and updated insurance card. Pt is aware of appt location.   

## 2021-11-27 ENCOUNTER — Other Ambulatory Visit (INDEPENDENT_AMBULATORY_CARE_PROVIDER_SITE_OTHER): Payer: Self-pay | Admitting: Family Medicine

## 2021-11-27 ENCOUNTER — Encounter: Payer: Self-pay | Admitting: Sports Medicine

## 2021-11-27 DIAGNOSIS — M62838 Other muscle spasm: Secondary | ICD-10-CM

## 2021-12-31 ENCOUNTER — Other Ambulatory Visit: Payer: Self-pay | Admitting: Internal Medicine

## 2021-12-31 DIAGNOSIS — Z8249 Family history of ischemic heart disease and other diseases of the circulatory system: Secondary | ICD-10-CM

## 2022-02-13 ENCOUNTER — Other Ambulatory Visit: Payer: Managed Care, Other (non HMO)

## 2022-03-18 ENCOUNTER — Ambulatory Visit (INDEPENDENT_AMBULATORY_CARE_PROVIDER_SITE_OTHER): Payer: Managed Care, Other (non HMO) | Admitting: Cardiovascular Disease

## 2022-03-18 ENCOUNTER — Encounter (HOSPITAL_BASED_OUTPATIENT_CLINIC_OR_DEPARTMENT_OTHER): Payer: Self-pay | Admitting: Cardiovascular Disease

## 2022-03-18 VITALS — BP 102/70 | HR 86 | Ht 69.0 in | Wt 302.4 lb

## 2022-03-18 DIAGNOSIS — I10 Essential (primary) hypertension: Secondary | ICD-10-CM

## 2022-03-18 DIAGNOSIS — Z8249 Family history of ischemic heart disease and other diseases of the circulatory system: Secondary | ICD-10-CM

## 2022-03-18 HISTORY — DX: Essential (primary) hypertension: I10

## 2022-03-18 HISTORY — DX: Family history of ischemic heart disease and other diseases of the circulatory system: Z82.49

## 2022-03-18 NOTE — Assessment & Plan Note (Signed)
Blood pressure well-controlled on her current regimen of losartan/HCTZ.  Recommend referral to the [place] exercise program.  Her goal is at least 150 minutes weekly.  Blood pressure was a little low today.  She is asymptomatic.  Hopefully with exercise and weight loss we can reduce her medication.

## 2022-03-18 NOTE — Patient Instructions (Signed)
Medication Instructions:  Your physician recommends that you continue on your current medications as directed. Please refer to the Current Medication list given to you today.  *If you need a refill on your cardiac medications before your next appointment, please call your pharmacy*  Lab Work: NONE  Testing/Procedures: Call if you decide to have Calcium Score  Follow-Up: At [facility], you and your health needs are our priority.  As part of our continuing mission to provide you with exceptional heart care, we have created designated Provider Care Teams.  These Care Teams include your primary Cardiologist (physician) and Advanced Practice Providers (APPs -  Physician Assistants and Nurse Practitioners) who all work together to provide you with the care you need, when you need it.  We recommend signing up for the patient portal called "MyChart".  Sign up information is provided on this After Visit Summary.  MyChart is used to connect with patients for Virtual Visits (Telemedicine).  Patients are able to view lab/test results, encounter notes, upcoming appointments, etc.  Non-urgent messages can be sent to your provider as well.   To learn more about what you can do with MyChart, go to [email or web address].    Your next appointment:   12 month(s)  The format for your next appointment:   In Person  Provider:   Dr Oval Linsey

## 2022-03-18 NOTE — Assessment & Plan Note (Addendum)
Mother had CAD at age 52.  Olivia [name] has no symptoms of CAD but is not very active due to limitation from neuropathy.  She is going to work on increasing her exercise.  Plan to get a coronary calcium score once she is ready.  For now, keep working on diet and exercise.  Her ASCVD 10-year risk is 1.3%.  Therefore no statin at this time.

## 2022-03-18 NOTE — Progress Notes (Signed)
Cardiology Office Note:    Date:  03/18/2022   ID:  Olivia [name], DOB [birth year], MRN [id number]  PCP:  Mckinley Jewel, MD   [facility] Providers Cardiologist:  None     Referring MD: Mckinley Jewel, MD   No chief complaint on file.   History of Present Illness:    Olivia [name] is a 52 y.o. female with a hx of hypertension, breast cancer s/p lumpectomy, radiation, and chemotherapy, here for risk stratification and evaluation of family history of CAD. She saw her PCP 12/2021 and reported a family history of CAD. Her PCP recommended a coronary calcium score but she declined and asked to be referred to cardiology. At that visit she was started on Wegov[place].  Today, she states she is feeling alright. She confirms a family history of CAD. Her mother had a heart attack and CABG x76 at 52 years old. Her father, mother, and sister also have hypertension. If she feels strange, she will check her blood pressure. One of her main concerns is ongoing water retention in her left breast, which she notes was the site of her lumpectomy in 2021. Sometimes she has swelling in her legs as well, currently L>R. Her formal exercise is also severely limited by her LE pain (feet, toes, and legs) attributable to neuropathy. When going upstairs, she does know if her legs will support her to the top. She denies any chest pain or pressure; no significant shortness of breath. Of note, her above symptoms have been an issue since after her lumpectomy. In the Summer she enjoys swimming. She has been trying to find a pool to use for routine exercise, but has struggled with finding one to fit her schedule. Normally she follows a low sodium diet with mostly [place], fish, and chicken. May drink water or ginger ale. At times she may have a cookie or turnover. On the weekends she may order out. Mancel Parsons was not covered by her insurance. No smoking history. She denies any palpitations, lightheadedness, headaches, syncope,  orthopnea, or PND.   Past Medical History:  Diagnosis Date   Breast cancer ([place])    left   Essential hypertension 03/18/2022   Family history of early CAD 03/18/2022   Lymphedema    Neuropathy    LE   Torn rotator cuff    left    Past Surgical History:  Procedure Laterality Date   ABDOMINAL HYSTERECTOMY     BREAST LUMPECTOMY Left    LYMPHADENECTOMY     TUBAL LIGATION      Current Medications: Current Meds  Medication Sig   furosemide (LASIX) 20 MG tablet Take 20 mg by mouth daily.   losartan-hydrochlorothiazide (HYZAAR) 100-25 MG tablet Take 1 tablet by mouth daily.   potassium chloride (KLOR-CON) 10 MEQ tablet Take 10 mEq by mouth daily.   tiZANidine (ZANAFLEX) 4 MG tablet Take 4 mg by mouth at bedtime as needed.     Allergies:   Gabapentin, Valsartan, and Penicillins   Social History   Socioeconomic History   Marital status: Legally Separated    Spouse name: Not on file   Number of children: 2   Years of education: Not on file   Highest education level: Some college, no degree  Occupational History    Comment: Statistician  Tobacco Use   Smoking status: Never   Smokeless tobacco: Never  Vaping Use   Vaping Use: Not on file  Substance and Sexual Activity   Alcohol use: Yes  Comment: social   Drug use: Yes    Types: Marijuana   Sexual activity: Not on file  Other Topics Concern   Not on file  Social History Narrative   Caffetine intake once a month a cup of coffee   Right handed   Social Determinants of Health   Financial Resource Strain: Not on file  Food Insecurity: No Food Insecurity (03/18/2022)   Hunger Vital Sign    Worried About Running Out of Food in the Last Year: Never true    Ran Out of Food in the Last Year: Never true  Transportation Needs: No Transportation Needs (03/18/2022)   PRAPARE - Hydrologist (Medical): No    Lack of Transportation (Non-Medical): No  Physical Activity:  Inactive (03/18/2022)   Exercise Vital Sign    Days of Exercise per Week: 0 days    Minutes of Exercise per Session: 0 min  Stress: Not on file  Social Connections: Not on file     Family History: The patient's family history includes CAD in her mother; Diabetes in her father; Hypertension in her father, mother, and sister.  ROS:   Please see the history of present illness.    (+) Fluid retention of left breast (+) LE edema (+) BLE pain/neuropathy All other systems reviewed and are negative.  EKGs/Labs/Other Studies Reviewed:    The following studies were reviewed today:  Echo  06/14/2020 [facility]): Conclusions:  1. Normal LV size and function.  2. Visually estimated left ventricular ejection fraction is 65  3. GLS not performed.  4. Limited study - patient self discontinued study due to pain.   EKG:   EKG is personally reviewed. 03/18/2022: Sinus rhythm. Rate 86 bpm.  Recent Labs: No results found for requested labs within last 365 days.   Recent Lipid Panel No results found for: "CHOL", "TRIG", "HDL", "CHOLHDL", "VLDL", "LDLCALC", "LDLDIRECT"   Physical Exam:    Wt Readings from Last 3 Encounters:  03/18/22 (!) 302 lb 6.4 oz (137.2 kg)  08/28/21 293 lb 8 oz (133.1 kg)  02/08/21 290 lb (131.5 kg)     VS:  BP 102/70 (BP Location: Right Arm, Patient Position: Sitting, Cuff Size: Large)   Pulse 86   Ht '5\' 9"'$  (1.753 m)   Wt (!) 302 lb 6.4 oz (137.2 kg)   BMI 44.66 kg/m  , BMI Body mass index is 44.66 kg/m. GENERAL:  Well appearing HEENT: Pupils equal round and reactive, fundi not visualized, oral mucosa unremarkable NECK:  No jugular venous distention, waveform within normal limits, carotid upstroke brisk and symmetric, no bruits, no thyromegaly LUNGS:  Clear to auscultation bilaterally HEART:  RRR.  PMI not displaced or sustained,S1 and S2 within normal limits, no S3, no S4, no clicks, no rubs, no murmurs ABD:  Flat, positive bowel sounds normal in frequency  in pitch, no bruits, no rebound, no guarding, no midline pulsatile mass, no hepatomegaly, no splenomegaly EXT:  2 plus pulses throughout, trace L LE edema. No cyanosis no clubbing SKIN:  No rashes no nodules NEURO:  Cranial nerves II through XII grossly intact, motor grossly intact throughout PSYCH:  Cognitively intact, oriented to person place and time  ASSESSMENT:    1. Family history of early CAD   2. Essential hypertension    PLAN:    Family history of early CAD Mother had CAD at age 32.  Ms. Schwan has no symptoms of CAD but is not very active due to  limitation from neuropathy.  She is going to work on increasing her exercise.  Plan to get a coronary calcium score once she is ready.  For now, keep working on diet and exercise.  Her ASCVD 10-year risk is 1.3%.  Therefore no statin at this time.  Essential hypertension Blood pressure well-controlled on her current regimen of losartan/HCTZ.  Recommend referral to the [place] exercise program.  Her goal is at least 150 minutes weekly.  Blood pressure was a little low today.  She is asymptomatic.  Hopefully with exercise and weight loss we can reduce her medication.       Disposition: FU with Acie Custis C. Oval Linsey, MD, [facility] in 1 year.  Medication Adjustments/Labs and Tests Ordered: Current medicines are reviewed at length with the patient today.  Concerns regarding medicines are outlined above.   Orders Placed This Encounter  Procedures   EKG 12-Lead   No orders of the defined types were placed in this encounter.  Patient Instructions  Medication Instructions:  Your physician recommends that you continue on your current medications as directed. Please refer to the Current Medication list given to you today.  *If you need a refill on your cardiac medications before your next appointment, please call your pharmacy*  Lab Work: NONE  Testing/Procedures: Call if you decide to have Calcium Score  Follow-Up: At [facility], you and your health needs are our priority.  As part of our continuing mission to provide you with exceptional heart care, we have created designated Provider Care Teams.  These Care Teams include your primary Cardiologist (physician) and Advanced Practice Providers (APPs -  Physician Assistants and Nurse Practitioners) who all work together to provide you with the care you need, when you need it.  We recommend signing up for the patient portal called "MyChart".  Sign up information is provided on this After Visit Summary.  MyChart is used to connect with patients for Virtual Visits (Telemedicine).  Patients are able to view lab/test results, encounter notes, upcoming appointments, etc.  Non-urgent messages can be sent to your provider as well.   To learn more about what you can do with MyChart, go to [email or web address].    Your next appointment:   12 month(s)  The format for your next appointment:   In Person  Provider:   Dr Wilhemina Bonito [place] as a scribe for Skeet Latch, MD.,have documented all relevant documentation on the behalf of Skeet Latch, MD,as directed by  Skeet Latch, MD while in the presence of Skeet Latch, MD.  I, Sanborn Oval Linsey, MD have reviewed all documentation for this visit.  The documentation of the exam, diagnosis, procedures, and orders on 03/18/2022 are all accurate and complete.   Signed, Skeet Latch, MD  03/18/2022 5:06 PM    [place]

## 2022-05-01 ENCOUNTER — Inpatient Hospital Stay: Payer: Managed Care, Other (non HMO)

## 2022-05-01 ENCOUNTER — Encounter: Payer: Self-pay | Admitting: Hematology and Oncology

## 2022-05-01 ENCOUNTER — Inpatient Hospital Stay: Payer: Managed Care, Other (non HMO) | Attending: Hematology and Oncology | Admitting: Hematology and Oncology

## 2022-05-01 ENCOUNTER — Telehealth: Payer: Self-pay | Admitting: Hematology and Oncology

## 2022-05-01 VITALS — BP 115/71 | HR 97 | Temp 97.7°F | Resp 16 | Ht 69.0 in | Wt 302.7 lb

## 2022-05-01 DIAGNOSIS — Z8049 Family history of malignant neoplasm of other genital organs: Secondary | ICD-10-CM | POA: Insufficient documentation

## 2022-05-01 DIAGNOSIS — Z853 Personal history of malignant neoplasm of breast: Secondary | ICD-10-CM | POA: Diagnosis not present

## 2022-05-01 DIAGNOSIS — Z9221 Personal history of antineoplastic chemotherapy: Secondary | ICD-10-CM | POA: Insufficient documentation

## 2022-05-01 DIAGNOSIS — G629 Polyneuropathy, unspecified: Secondary | ICD-10-CM | POA: Insufficient documentation

## 2022-05-01 DIAGNOSIS — Z923 Personal history of irradiation: Secondary | ICD-10-CM | POA: Insufficient documentation

## 2022-05-01 DIAGNOSIS — Z803 Family history of malignant neoplasm of breast: Secondary | ICD-10-CM | POA: Diagnosis not present

## 2022-05-01 DIAGNOSIS — I10 Essential (primary) hypertension: Secondary | ICD-10-CM | POA: Diagnosis not present

## 2022-05-01 NOTE — Progress Notes (Signed)
Olivia [name] CONSULT NOTE  Patient Care Team: Olivia [name] [name], Olivia [name] as PCP - General (Internal Medicine)  CHIEF COMPLAINTS/PURPOSE OF CONSULTATION:  Newly diagnosed breast cancer  ASSESSMENT AND PLAN:   This is a very pleasant 52 year old female patient diagnosed with breast cancer in January 2021 at the age of [name], Olivia [name] breast T2 N0 triple negative with Ki-67 of 80% status post neoadjuvant chemo with AC followed by CarboTaxol, chemotherapy modified because of poor tolerance and was found to have complete pathologic response on lumpectomy.  She then underwent adjuvant radiation and was followed up at [facility] by her oncologist.  She is now establishing with [place] locally. She has been doing really well in the past 2 yrs except for neuropathy. She denies any other concerns. Physical exam with bilateral reduction mammoplasty, Olivia [name] breast with a orange peel skin changes consistent with postradiation.  Some tenderness along the surgical scar but no obvious lumps.  No regional adenopathy. Her last mammogram from September 2023 with no concerns.  I have ordered repeat mammogram for September 2024.  She would like to also establish with the breast surgeon in [place] hence referrals placed to [place] [facility]. She can return to clinic with [place] in 1 year at this point since she is almost 3 years out from the diagnosis of cancer and she is very happy with this plan. Thank you for consulting [place] in the care of this patient.  Please do not hesitate to contact us with any additional questions or concerns  HISTORY OF PRESENTING ILLNESS:  Olivia [name] [name] 52 y.o. female is here because of recent diagnosis of Olivia [name] breast cancer  This is a very pleasant 52 year old female patient with past medical history significant for Olivia [name] breast cancer diagnosed on a screening mammogram back in January 2021 which showed a Olivia [name] breast 3.1 x 2 cm hypoechoic mass and mildly enlarged axillary node.  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  Lymph node was thought to be benign.    She underwent chemotherapy with [facility] from April 14, 2019 to June 09, 2019 followed by [facility] from 06/23/2019 to 09/15/2019.  She was not able to tolerate CarboTaxol hence was changed to single agent Taxol starting cycle 2-day 8 which was once again stopped with 1 infusion Olivia [name] because of severe neuropathy pain.  She then underwent Olivia [name] lumpectomy and sentinel lymph node biopsy on October 14, 2019.  Final pathologic staging was pT0 N0 consistent with complete clinical response.  She then underwent radiation from December 05, 2019 through 11/22/202.  She was last seen by Dr. Buena [name] at [facility] on November 01, 2021.   She recently moved to [facility] and is establishing with [place].   Her last mammogram was November 01, 2021 with no evidence of malignancy, posttreatment changes in the Olivia [name] breast and postreduction changes in the right breast.  FH of breast cancer in maternal aunt at 65, died from uterine cancer. Maternal cousin had BC in her 32's, died from it. Another maternal aunt died from lung cancer at the age of 50, never smoker. She declined genetic testing in the past, but interested now.  She had 2 kids, one passed away in 2018-05-21, daughter lives in [place]. She breast fed both her kids.  I reviewed her records extensively and collaborated the history with the patient.   MEDICAL HISTORY:  Past Medical History:  Diagnosis Date   Breast cancer ([place])  Olivia [name]   Essential hypertension 03/18/2022   Family history of early CAD 03/18/2022   Lymphedema    Neuropathy    LE   Torn rotator cuff    Olivia [name]    SURGICAL HISTORY: Past Surgical History:  Procedure Laterality Date   ABDOMINAL HYSTERECTOMY     BREAST LUMPECTOMY Olivia [name]    LYMPHADENECTOMY     TUBAL LIGATION      SOCIAL HISTORY: Social History   Socioeconomic History   Marital status:  Legally Separated    Spouse name: Not on file   Number of children: 2   Years of education: Not on file   Highest education level: Some college, no degree  Occupational History    Comment: Statistician  Tobacco Use   Smoking status: Never   Smokeless tobacco: Never  Vaping Use   Vaping Use: Not on file  Substance and Sexual Activity   Alcohol use: Yes    Comment: social   Drug use: Yes    Types: Marijuana   Sexual activity: Not on file  Other Topics Concern   Not on file  Social History Narrative   Caffetine intake once a month a cup of coffee   Right handed   Social Determinants of Health   Financial Resource Strain: Not on file  Food Insecurity: No Food Insecurity (03/18/2022)   Hunger Vital Sign    Worried About Running Out of Food in the Last Year: Never true    Ran Out of Food in the Last Year: Never true  Transportation Needs: No Transportation Needs (03/18/2022)   PRAPARE - Hydrologist (Medical): No    Lack of Transportation (Non-Medical): No  Physical Activity: Inactive (03/18/2022)   Exercise Vital Sign    Days of Exercise per Week: 0 days    Minutes of Exercise per Session: 0 min  Stress: Not on file  Social Connections: Not on file  Intimate Partner Violence: Not on file    FAMILY HISTORY: Family History  Problem Relation Age of Onset   Hypertension Mother    CAD Mother    Diabetes Father    Hypertension Father    Hypertension Sister     ALLERGIES:  is allergic to gabapentin, valsartan, and penicillins.  MEDICATIONS:  Current Outpatient Medications  Medication Sig Dispense Refill   furosemide (LASIX) 20 MG tablet Take 20 mg by mouth daily.     losartan-hydrochlorothiazide (HYZAAR) 100-25 MG tablet Take 1 tablet by mouth daily.     potassium chloride (KLOR-CON) 10 MEQ tablet Take 10 mEq by mouth daily.     tiZANidine (ZANAFLEX) 4 MG tablet Take 4 mg by mouth at bedtime as needed.     No current  facility-administered medications for this visit.    REVIEW OF SYSTEMS:   Constitutional: Denies fevers, chills or abnormal night sweats Eyes: Denies blurriness of vision, double vision or watery eyes Ears, nose, mouth, throat, and face: Denies mucositis or sore throat Respiratory: Denies cough, dyspnea or wheezes Cardiovascular: Denies palpitation, chest discomfort or lower extremity swelling Gastrointestinal:  Denies nausea, heartburn or change in bowel habits Skin: Denies abnormal skin rashes Lymphatics: Denies new lymphadenopathy or easy bruising Neurological:Denies numbness, tingling or new weaknesses Behavioral/Psych: Mood is stable, no new changes  Breast: Denies any palpable lumps or discharge All other systems were reviewed with the patient and are negative.  PHYSICAL EXAMINATION: ECOG PERFORMANCE STATUS: 0 - Asymptomatic  Vitals:   05/01/22 1357  BP:  115/71  Pulse: 97  Resp: 16  Temp: 97.7 F (36.5 C)  SpO2: 96%   Filed Weights   05/01/22 1357  Weight: (!) 302 lb 11.2 oz (137.3 kg)   General appearance: Alert, oriented and in no acute distress.   Breast: Bilateral breasts inspected and palpated.  She is status post bilateral reduction mammoplasty.  Olivia [name] breast with skin changes consistent with postradiation.  Some tenderness along the surgical scar but no obvious lumps concerning for recurrence.  No regional adenopathy No lower extremity edema  LABORATORY DATA:  I have reviewed the data as listed No results found for: "WBC", "HGB", "HCT", "MCV", "PLT" No results found for: "NA", "K", "CL", "CO2"  RADIOGRAPHIC STUDIES: I have personally reviewed the radiological reports and agreed with the findings in the report.  Total time spent: 45 minutes All questions were answered. The patient knows to call the clinic with any problems, questions or concerns.    Benay Pike, Olivia [name] 05/01/22

## 2022-05-01 NOTE — Telephone Encounter (Signed)
Per 3/14 IB reached out to patient to schedule left voicemail

## 2022-05-23 ENCOUNTER — Ambulatory Visit (INDEPENDENT_AMBULATORY_CARE_PROVIDER_SITE_OTHER): Payer: Managed Care, Other (non HMO)

## 2022-05-23 ENCOUNTER — Encounter (HOSPITAL_COMMUNITY): Payer: Self-pay

## 2022-05-23 ENCOUNTER — Ambulatory Visit (HOSPITAL_COMMUNITY)
Admission: RE | Admit: 2022-05-23 | Discharge: 2022-05-23 | Disposition: A | Discharge status: Home / Self Care | Payer: Managed Care, Other (non HMO) | Source: Ambulatory Visit | Attending: Emergency Medicine | Admitting: Emergency Medicine

## 2022-05-23 VITALS — BP 122/82 | HR 83 | Temp 98.4°F | Resp 16

## 2022-05-23 DIAGNOSIS — S29012A Strain of muscle and tendon of back wall of thorax, initial encounter: Secondary | ICD-10-CM

## 2022-05-23 DIAGNOSIS — R0782 Intercostal pain: Secondary | ICD-10-CM | POA: Diagnosis not present

## 2022-05-23 DIAGNOSIS — R0602 Shortness of breath: Secondary | ICD-10-CM

## 2022-05-23 MED ORDER — METHOCARBAMOL 500 MG PO TABS: 500.0000 mg | ORAL_TABLET | Freq: Two times a day (BID) | ORAL | 0 refills | Status: DC

## 2022-05-23 MED ORDER — NAPROXEN 500 MG PO TABS: 500.0000 mg | ORAL_TABLET | Freq: Two times a day (BID) | ORAL | 0 refills | Status: AC

## 2022-05-23 MED ORDER — KETOROLAC TROMETHAMINE 60 MG/2ML IM SOLN
60.0000 mg | Freq: Once | INTRAMUSCULAR | Status: AC
  Administered 2022-05-23: 60 mg via INTRAMUSCULAR

## 2022-05-23 MED ORDER — KETOROLAC TROMETHAMINE 60 MG/2ML IM SOLN
INTRAMUSCULAR | Status: AC
  Filled 2022-05-23: qty 2

## 2022-05-23 NOTE — ED Triage Notes (Signed)
Patient c/o upper back pain x 9 days. Patient states SOB started yesterday.  Patient states she took Ibuprofen and an allergy pill at 2300 last night.Patient states she took a previously prescribed muscle relaxer 2 days ago.

## 2022-05-23 NOTE — ED Provider Notes (Signed)
[facility]    CSN: [id number] Arrival date & time: 05/23/22  1729      History   Chief Complaint Chief Complaint  Patient presents with   Back Pain    I have had upper back pain moderate for a week now increasing and can feel short of breath when I take a breath. - Entered by patient    HPI Olivia [name] is a 52 y.o. female.   Patient presents to clinic for left sided mid thoracic back pain that has been ongoing for the past 9 days.  Reports her pain got much worse starting yesterday and she developed a dry intermittent cough.  She is unsure what triggered her back pain, she does remember her after her shower trying to bend over and dry her legs and after that she noticed the back pain.  She had tried 1 muscle relaxer without relief, has tried warm compress, massage, ice and multiple other at home remedies without relief.  She denies any previous back injuries, denies any falls or trauma to her back.  She denies any numbness, tingling or incontinence.  Musculoskeletal tenderness to palpation of the area.  Reports she is unable to take deep breaths due to the pain, has been unable to sleep on her left side due to the pain.  The history is provided by the patient and medical records.  Back Pain Associated symptoms: no abdominal pain, no chest pain, no dysuria and no fever     Past Medical History:  Diagnosis Date   Breast cancer    left   Essential hypertension 03/18/2022   Family history of early CAD 03/18/2022   Lymphedema    Neuropathy    LE   Torn rotator cuff    left    Patient Active Problem List   Diagnosis Date Noted   Family history of early CAD 03/18/2022   Essential hypertension 03/18/2022   Left rotator cuff tear 10/08/2020    Past Surgical History:  Procedure Laterality Date   ABDOMINAL HYSTERECTOMY     BREAST LUMPECTOMY Left    LYMPHADENECTOMY     TUBAL LIGATION      OB History   No obstetric history on file.      Home Medications     Prior to Admission medications   Medication Sig Start Date End Date Taking? Authorizing Provider  methocarbamol (ROBAXIN) 500 MG tablet Take 1 tablet (500 mg total) by mouth 2 (two) times daily. 05/23/22  Yes Rinaldo RatelGarrison, [place] N, FNP  naproxen (NAPROSYN) 500 MG tablet Take 1 tablet (500 mg total) by mouth 2 (two) times daily for 7 days. 05/23/22 05/30/22 Yes Rinaldo RatelGarrison, [place] N, FNP  furosemide (LASIX) 20 MG tablet Take 20 mg by mouth daily. 12/31/21   [provider]  losartan-hydrochlorothiazide (HYZAAR) 100-25 MG tablet Take 1 tablet by mouth daily. 04/30/20   [provider]  tiZANidine (ZANAFLEX) 4 MG tablet Take 4 mg by mouth at bedtime as needed. 12/31/21   [provider]    Family History Family History  Problem Relation Age of Onset   Hypertension Mother    CAD Mother    Diabetes Father    Hypertension Father    Hypertension Sister     Social History Social History   Tobacco Use   Smoking status: Never   Smokeless tobacco: Never  Substance Use Topics   Alcohol use: Yes    Comment: social   Drug use: Yes    Types: Marijuana  Allergies   Gabapentin, Valsartan, and Penicillins   Review of Systems Review of Systems  Constitutional:  Negative for fatigue and fever.  HENT:  Negative for sore throat.   Eyes:  Negative for discharge.  Respiratory:  Positive for cough and shortness of breath. Negative for wheezing.   Cardiovascular:  Negative for chest pain.  Gastrointestinal:  Negative for abdominal pain.  Genitourinary:  Negative for dysuria and flank pain.  Musculoskeletal:  Positive for back pain. Negative for gait problem, neck pain and neck stiffness.     Physical Exam Triage Vital Signs ED Triage Vitals  Enc Vitals Group     BP 05/23/22 1754 122/82     Pulse Rate 05/23/22 1754 83     Resp 05/23/22 1754 16     Temp 05/23/22 1754 98.4 F (36.9 C)     Temp Source 05/23/22 1754 Oral     SpO2 05/23/22 1754 96 %     Weight --       Height --      Head Circumference --      Peak Flow --      Pain Score 05/23/22 1755 7     Pain Loc --      Pain Edu? --      Excl. in GC? --    No data found.  Updated Vital Signs BP 122/82 (BP Location: Left Arm)   Pulse 83   Temp 98.4 F (36.9 C) (Oral)   Resp 16   SpO2 96%   Visual Acuity Right Eye Distance:   Left Eye Distance:   Bilateral Distance:    Right Eye Near:   Left Eye Near:    Bilateral Near:     Physical Exam Vitals and nursing note reviewed.  Constitutional:      General: She is not in acute distress.    Appearance: She is well-developed.  HENT:     Head: Normocephalic and atraumatic.     Right Ear: External ear normal.     Left Ear: External ear normal.     Mouth/Throat:     Mouth: Mucous membranes are moist.  Eyes:     Conjunctiva/sclera: Conjunctivae normal.  Cardiovascular:     Rate and Rhythm: Normal rate and regular rhythm.     Heart sounds: Normal heart sounds, S1 normal and S2 normal. No murmur heard. Pulmonary:     Effort: Pulmonary effort is normal. No respiratory distress.     Breath sounds: Normal breath sounds.     Comments: Lungs vesicular posteriorly. Musculoskeletal:        General: Tenderness present. No swelling, deformity or signs of injury.     Cervical back: Normal and neck supple.     Thoracic back: Tenderness present. Decreased range of motion.     Lumbar back: Normal.       Back:     Comments: Left-sided thoracic musculoskeletal tenderness to palpation.  Reports she is unable to take deep breaths due to the pain in this area.  Brought to tears on palpation.  Endorses pain with bending or twisting.  Skin:    General: Skin is warm and dry.     Capillary Refill: Capillary refill takes less than 2 seconds.  Neurological:     Mental Status: She is alert and oriented to person, place, and time.  Psychiatric:        Mood and Affect: Mood normal.        Behavior: Behavior is cooperative.  UC Treatments /  Results  Labs (all labs ordered are listed, but only abnormal results are displayed) Labs Reviewed - No data to display  EKG   Radiology DG Ribs Unilateral W/Chest Left  Result Date: 05/23/2022 CLINICAL DATA:  Shortness of breath and ribcage pain. EXAM: LEFT RIBS AND CHEST - 3+ VIEW COMPARISON:  Chest x-ray with right ribs 10/08/2020 FINDINGS: No fracture or other bone lesions are seen involving the ribs. There is no evidence of pneumothorax or pleural effusion. Both lungs are clear. Heart size and mediastinal contours are within normal limits. Left axillary surgical clips are present. IMPRESSION: Negative. Electronically Signed   By: Darliss Cheney M.D.   On: 05/23/2022 18:46    Procedures Procedures (including critical care time)  Medications Ordered in UC Medications  ketorolac (TORADOL) injection 60 mg (60 mg Intramuscular Given 05/23/22 1835)    Initial Impression / Assessment and Plan / UC Course  I have reviewed the triage vital signs and the nursing notes.  Pertinent labs & imaging results that were available during my care of the patient were reviewed by me and considered in my medical decision making (see chart for details).  Vitals in triage reviewed, patient is hemodynamically stable.  Point tenderness to left-sided thoracic area, appears musculoskeletal in nature.  Will obtain imaging to rule out rib fracture or dislocation, low suspicion for this due to lack of trauma, however, some concern over increasing pain and duration of pain.  Imaging negative for pneumonia, dislocation or fracture.  Discussed this is likely a thoracic musculoskeletal strain and will treat with muscle relaxers and anti-inflammatories.  Advise follow-up with sports medicine, patient is already established due to rotator cuff issues in the past.  Symptomatic management discussed, return in follow-up precautions reviewed.  Patient verbalized understanding, no questions at this time.     Final Clinical  Impressions(s) / UC Diagnoses   Final diagnoses:  Strain of thoracic back region     Discharge Instructions      Your symptoms are consistent with a thoracic back strain.  Your chest x-ray was negative for any signs of pneumonia or rib fractures.  Please continue with your symptomatic relief of warm compresses, gentle stretching and massage.  Please also start the muscle relaxer, do not drink or drive on this medication as it may make you drowsy.  Starting tomorrow you can do the naproxen twice daily, you can take it with food to help prevent gastrointestinal upset.  If your symptoms do not improve over the weekend, please follow-up with [place] sports [facility] for further evaluation and potential physical therapy.  Please seek immediate care if you develop numbness, tingling, chest pain, or any new concerning symptoms.       ED Prescriptions     Medication Sig Dispense Auth. Provider   methocarbamol (ROBAXIN) 500 MG tablet Take 1 tablet (500 mg total) by mouth 2 (two) times daily. 20 tablet Rinaldo Ratel, [place] N, [place]   naproxen (NAPROSYN) 500 MG tablet Take 1 tablet (500 mg total) by mouth 2 (two) times daily for 7 days. 14 tablet Wandra Babin, [place] N, [place]      PDMP not reviewed this encounter.   Evart Mcdonnell, [place] N, [place] 05/23/22 1900

## 2022-05-23 NOTE — Discharge Instructions (Addendum)
Your symptoms are consistent with a thoracic back strain.  Your chest x-ray was negative for any signs of pneumonia or rib fractures.  Please continue with your symptomatic relief of warm compresses, gentle stretching and massage.  Please also start the muscle relaxer, do not drink or drive on this medication as it may make you drowsy.  Starting tomorrow you can do the naproxen twice daily, you can take it with food to help prevent gastrointestinal upset.  If your symptoms do not improve over the weekend, please follow-up with [facility] [facility] sports medicine for further evaluation and potential physical therapy.  Please seek immediate care if you develop numbness, tingling, chest pain, or any new concerning symptoms.

## 2022-06-17 NOTE — Progress Notes (Unsigned)
    [facility]liv[name] [name] [address] [place] [address] Phone: [phone]   Assessment and Plan:     There are no diagnoses linked to this encounter.  ***   Pertinent previous records reviewed include ***   Follow Up: ***     Subjective:   I, Olivia [name], am serving as a Neurosurgeon for Doctor [name]  Chief Complaint: thoracic back pain   HPI:   06/18/2022 Patient is a 52 year old female complaining of thoracic back pain. Patient states left sided mid thoracic back pain that has been ongoing for the past 9 days. Reports her pain got much worse starting yesterday and she developed a dry intermittent cough. She is unsure what triggered her back pain, she does remember her after her shower trying to bend over and dry her legs and after that she noticed the back pain. She had tried 1 muscle relaxer without relief, has tried warm compress, massage, ice and multiple other at home remedies without relief. She denies any previous back injuries, denies any falls or trauma to her back. She denies any numbness, tingling or incontinence. Musculoskeletal tenderness to palpation of the area. Reports she is unable to take deep breaths due to the pain, has been unable to sleep on her left side due to the pain.   Relevant Historical Information: ***  Additional pertinent review of systems negative.   Current Outpatient Medications:    furosemide (LASIX) 20 MG tablet, Take 20 mg by mouth daily., Disp: , Rfl:    losartan-hydrochlorothiazide (HYZAAR) 100-25 MG tablet, Take 1 tablet by mouth daily., Disp: , Rfl:    methocarbamol (ROBAXIN) 500 MG tablet, Take 1 tablet (500 mg total) by mouth 2 (two) times daily., Disp: 20 tablet, Rfl: 0   tiZANidine (ZANAFLEX) 4 MG tablet, Take 4 mg by mouth at bedtime as needed., Disp: , Rfl:    Objective:     There were no vitals filed for this visit.    There is no height or weight on file to calculate BMI.     Physical Exam:    ***   Electronically signed by:  O[name] [name] 7:21 AM 06/17/22

## 2022-06-18 ENCOUNTER — Ambulatory Visit: Payer: Managed Care, Other (non HMO) | Admitting: Sports Medicine

## 2022-06-18 ENCOUNTER — Inpatient Hospital Stay: Payer: Managed Care, Other (non HMO)

## 2022-06-18 ENCOUNTER — Inpatient Hospital Stay: Payer: Managed Care, Other (non HMO) | Attending: Hematology and Oncology | Admitting: Genetic Counselor

## 2022-06-18 ENCOUNTER — Encounter: Payer: Self-pay | Admitting: Genetic Counselor

## 2022-06-18 ENCOUNTER — Other Ambulatory Visit: Payer: Self-pay | Admitting: Genetic Counselor

## 2022-06-18 VITALS — HR 99 | Ht 69.0 in | Wt 302.0 lb

## 2022-06-18 DIAGNOSIS — M25511 Pain in right shoulder: Secondary | ICD-10-CM

## 2022-06-18 DIAGNOSIS — M546 Pain in thoracic spine: Secondary | ICD-10-CM

## 2022-06-18 DIAGNOSIS — Z803 Family history of malignant neoplasm of breast: Secondary | ICD-10-CM

## 2022-06-18 DIAGNOSIS — Z853 Personal history of malignant neoplasm of breast: Secondary | ICD-10-CM

## 2022-06-18 DIAGNOSIS — Z800 Family history of malignant neoplasm of digestive organs: Secondary | ICD-10-CM | POA: Diagnosis not present

## 2022-06-18 LAB — GENETIC SCREENING ORDER

## 2022-06-18 MED ORDER — MELOXICAM 15 MG PO TABS: 15.0000 mg | ORAL_TABLET | Freq: Every day | ORAL | 0 refills | Status: DC

## 2022-06-18 NOTE — Patient Instructions (Addendum)
Good to see you  - Start meloxicam 15 mg daily x2 weeks.  If still having pain after 2 weeks, complete 3rd-week of meloxicam. May use remaining meloxicam as needed once daily for pain control.  Do not to use additional NSAIDs while taking meloxicam.  May use Tylenol [phone] mg 2 to 3 times a day for breakthrough pain. Thoracic HEP  Robaxin as needed  3 week follow up

## 2022-06-23 ENCOUNTER — Encounter: Payer: Self-pay | Admitting: Genetic Counselor

## 2022-06-23 DIAGNOSIS — Z803 Family history of malignant neoplasm of breast: Secondary | ICD-10-CM | POA: Insufficient documentation

## 2022-06-23 DIAGNOSIS — Z853 Personal history of malignant neoplasm of breast: Secondary | ICD-10-CM | POA: Insufficient documentation

## 2022-06-23 DIAGNOSIS — Z800 Family history of malignant neoplasm of digestive organs: Secondary | ICD-10-CM | POA: Insufficient documentation

## 2022-06-23 NOTE — Progress Notes (Signed)
REFERRING PROVIDER: Rachel Moulds, MD [address] [place],  [place] [address]  PRIMARY PROVIDER:  Ollen Bowl, MD  PRIMARY REASON FOR VISIT:  1. Family history of breast cancer   2. Family history of colon cancer   3. Family history of stomach cancer   4. Personal history of breast cancer      HISTORY OF PRESENT ILLNESS:   Olivia [name], a 52 y.o. female, was seen for a [facility] cancer genetics consultation at the request of Dr. Al Pimple due to a personal and family history of cancer.  Olivia [name] presents to clinic today to discuss the possibility of a hereditary predisposition to cancer, genetic testing, and to further clarify her future cancer risks, as well as potential cancer risks for family members.   In 2022, at the age of 72, Olivia [name] was diagnosed with triple negative breast cancer.  The treatment plan lumpectomy, chemotherapy and radiation.     CANCER HISTORY:  Oncology History   No history exists.     RISK FACTORS:  Menarche was at age 89.  First live birth at age 72.  OCP use for approximately  <1  years.  Ovaries intact: one ovary.  Hysterectomy: yes.  Menopausal status: postmenopausal.  HRT use: 0 years. Colonoscopy: yes; normal. Mammogram within the last year: yes. Number of breast biopsies: 2. Up to date with pelvic exams: yes. Any excessive radiation exposure in the past: yes  Past Medical History:  Diagnosis Date   Breast cancer (HCC)    left   Essential hypertension 03/18/2022   Family history of breast cancer    Family history of breast cancer    Family history of colon cancer    Family history of early CAD 03/18/2022   Family history of stomach cancer    Lymphedema    Neuropathy    LE   Torn rotator cuff    left    Past Surgical History:  Procedure Laterality Date   ABDOMINAL HYSTERECTOMY     BREAST LUMPECTOMY Left    LYMPHADENECTOMY     TUBAL LIGATION      Social History   Socioeconomic History   Marital status: Legally  Separated    Spouse name: Not on file   Number of children: 2   Years of education: Not on file   Highest education level: Some college, no degree  Occupational History    Comment: Engineering geologist  Tobacco Use   Smoking status: Never   Smokeless tobacco: Never  Vaping Use   Vaping Use: Not on file  Substance and Sexual Activity   Alcohol use: Yes    Comment: social   Drug use: Yes    Types: Marijuana   Sexual activity: Not on file  Other Topics Concern   Not on file  Social History Narrative   Caffetine intake once a month a cup of coffee   Right handed   Social Determinants of Health   Financial Resource Strain: Not on file  Food Insecurity: No Food Insecurity (03/18/2022)   Hunger Vital Sign    Worried About Running Out of Food in the Last Year: Never true    Ran Out of Food in the Last Year: Never true  Transportation Needs: No Transportation Needs (03/18/2022)   PRAPARE - Administrator, Civil Service (Medical): No    Lack of Transportation (Non-Medical): No  Physical Activity: Inactive (03/18/2022)   Exercise Vital Sign    Days of  Exercise per Week: 0 days    Minutes of Exercise per Session: 0 min  Stress: Not on file  Social Connections: Not on file     FAMILY HISTORY:  We obtained a detailed, 4-generation family history.  Significant diagnoses are listed below: Family History  Problem Relation Age of Onset   Hypertension Mother    CAD Mother    Diabetes Father    Hypertension Father    Colon cancer Father 44   Hypertension Sister    Breast cancer Maternal Aunt 57   Vaginal cancer Maternal Aunt 75   Breast cancer Maternal Aunt 46   Breast cancer Maternal Aunt 69   Stomach cancer Paternal Aunt 85   Cancer Maternal Grandmother        NOS   Breast cancer Cousin 35       d. 67; maternal first cousin   Leukemia Cousin 60       d. 42; maternal first cousin   Breast cancer Cousin 14       paternal first cousin   Healthy  Half-Brother    Healthy Half-Sister     The patient has a son and daughter, her son has passed away.  She has a full brother and a paternal half brother and sister who are cancer free.  Both parents are living.  The patient's father had colon cancer at 60.  He had two brothers and two sisters.  One sister had stomach cancer at 56.  She has a daughter who had breast cancer at 51.  The paternal grandparents are deceased from non-cancer related issues.  The patient's mother does not have cancer.  She has five brothers and five sisters.  Three sisters had breast cancer, one had a son with leukemia and a second had a daughter with breast cancer at 59.  The maternal grandmother had an unknown cancer at 39.  Olivia [name] is unaware of previous family history of genetic testing for hereditary cancer risks. Patient's maternal ancestors are of African American descent, and paternal ancestors are of African American descent. There is no reported Ashkenazi Jewish ancestry. There is no known consanguinity.  GENETIC COUNSELING ASSESSMENT: Ms. Madeja is a 52 y.o. female with a personal and family history of cancer which is somewhat suggestive of a hereditary cancer syndrome and predisposition to cancer given the number of women in the family with breast cancer and the young age of onset. We, therefore, discussed and recommended the following at today's visit.   DISCUSSION: We discussed that, in general, most cancer is not inherited in families, but instead is sporadic or familial. Sporadic cancers occur by chance and typically happen at older ages (>50 years) as this type of cancer is caused by genetic changes acquired during an individual's lifetime. Some families have more cancers than would be expected by chance; however, the ages or types of cancer are not consistent with a known genetic mutation or known genetic mutations have been ruled out. This type of familial cancer is thought to be due to a combination of  multiple genetic, environmental, hormonal, and lifestyle factors. While this combination of factors likely increases the risk of cancer, the exact source of this risk is not currently identifiable or testable.  We discussed that 5 - 10% of breast cancer is hereditary, with most cases associated with BRCA mutations.  There are other genes that can be associated with hereditary breast cancer syndromes.  These include ATM, CHEK2 and PALB2.  We also discussed Burnadette Peter  syndrome due to the family history of colon and stomach cancer, although the concern for that condition is lower.  We discussed that testing is beneficial for several reasons including knowing how to follow individuals after completing their treatment, identifying whether potential treatment options such as PARP inhibitors would be beneficial, and understand if other family members could be at risk for cancer and allow them to undergo genetic testing.   We reviewed the characteristics, features and inheritance patterns of hereditary cancer syndromes. We also discussed genetic testing, including the appropriate family members to test, the process of testing, insurance coverage and turn-around-time for results. We discussed the implications of a negative, positive, carrier and/or variant of uncertain significant result. Olivia [name]  was offered a common hereditary cancer panel (47 genes) and an expanded pan-cancer panel (77 genes). Olivia [name] was informed of the benefits and limitations of each panel, including that expanded pan-cancer panels contain genes that do not have clear management guidelines at this point in time.  We also discussed that as the number of genes included on a panel increases, the chances of variants of uncertain significance increases. Olivia [name] decided to pursue genetic testing for the Multi-cancer + RNA gene panel.   The Multi-Cancer + RNA Panel offered by [facility] includes sequencing and/or deletion/duplication analysis of the  following 70 genes:  AIP*, ALK, APC*, ATM*, AXIN2*, BAP1*, BARD1*, BLM*, BMPR1A*, BRCA1*, BRCA2*, BRIP1*, CDC73*, CDH1*, CDK4, CDKN1B*, CDKN2A, CHEK2*, CTNNA1*, DICER1*, EPCAM (del/dup only), EGFR, FH*, FLCN*, GREM1 (promoter dup only), HOXB13, KIT, LZTR1, MAX*, MBD4, MEN1*, MET, MITF, MLH1*, MSH2*, MSH3*, MSH6*, MUTYH*, NF1*, NF2*, NTHL1*, PALB2*, PDGFRA, PMS2*, POLD1*, POLE*, POT1*, PRKAR1A*, PTCH1*, PTEN*, RAD51C*, RAD51D*, RB1*, RET, SDHA* (sequencing only), SDHAF2*, SDHB*, SDHC*, SDHD*, SMAD4*, SMARCA4*, SMARCB1*, SMARCE1*, STK11*, SUFU*, TMEM127*, TP53*, TSC1*, TSC2*, VHL*. RNA analysis is performed for * genes.   Based on Olivia [name] personal and family history of cancer, she meets medical criteria for genetic testing. Despite that she meets criteria, she may still have an out of pocket cost. We discussed that if her out of pocket cost for testing is over $100, the laboratory will call and confirm whether she wants to proceed with testing.  If the out of pocket cost of testing is less than $100 she will be billed by the genetic testing laboratory.   We discussed that some people do not want to undergo genetic testing due to fear of genetic discrimination.  The Genetic Information Nondiscrimination Act (GINA) was signed into federal law in 2008. GINA prohibits health insurers and most employers from discriminating against individuals based on genetic information (including the results of genetic tests and family history information). According to GINA, health insurance companies cannot consider genetic information to be a preexisting condition, nor can they use it to make decisions regarding coverage or rates. GINA also makes it illegal for most employers to use genetic information in making decisions about hiring, firing, promotion, or terms of employment. It is important to note that GINA does not offer protections for life insurance, disability insurance, or long-term care insurance. GINA does not  apply to those in the [employer], those who work for companies with less than 15 employees, and new life insurance or long-term disability insurance policies.  Health status due to a cancer diagnosis is not protected under GINA. More information about GINA can be found by visiting [email or web address].  PLAN: After considering the risks, benefits, and limitations, Olivia [name] provided informed consent to pursue genetic testing and the blood sample was  sent to [facility] for analysis of the Multi-cancer + RNA gene panel. Results should be available within approximately 2-3 weeks' time, at which point they will be disclosed by telephone to Olivia [name], as will any additional recommendations warranted by these results. Olivia [name] will receive a summary of her genetic counseling visit and a copy of her results once available. This information will also be available in Epic.   Lastly, we encouraged Olivia [name] to remain in contact with cancer genetics annually so that we can continuously update the family history and inform her of any changes in cancer genetics and testing that may be of benefit for this family.   Olivia [name] questions were answered to her satisfaction today. Our contact information was provided should additional questions or concerns arise. Thank you for the referral and allowing [place] to share in the care of your patient.   Hanifah Royse P. Lowell Guitar, MS, [facility] Licensed, Patent attorney [email or web address] phone: [phone]  The patient was seen for a total of 30 minutes in face-to-face genetic counseling.  The patient was seen alone.  Drs. Meliton Rattan, and/or [place] were available for questions, if needed..    _______________________________________________________________________ For Office Staff:  Number of people involved in session: 1 Was an Intern/ student involved with case: no

## 2022-06-30 ENCOUNTER — Encounter: Payer: Self-pay | Admitting: Genetic Counselor

## 2022-06-30 DIAGNOSIS — Z1379 Encounter for other screening for genetic and chromosomal anomalies: Secondary | ICD-10-CM | POA: Insufficient documentation

## 2022-07-02 ENCOUNTER — Encounter: Payer: Self-pay | Admitting: Sports Medicine

## 2022-07-02 ENCOUNTER — Ambulatory Visit: Payer: Self-pay | Admitting: Genetic Counselor

## 2022-07-02 ENCOUNTER — Telehealth: Payer: Self-pay | Admitting: Genetic Counselor

## 2022-07-02 DIAGNOSIS — Z1379 Encounter for other screening for genetic and chromosomal anomalies: Secondary | ICD-10-CM

## 2022-07-02 NOTE — Progress Notes (Signed)
HPI:  Olivia [name] was previously seen in the [facility] due to a personal and family history of breast cancer and concerns regarding a hereditary predisposition to cancer. Please refer to our prior cancer genetics clinic note for more information regarding our discussion, assessment and recommendations, at the time. Olivia [name] recent genetic test results were disclosed to her, as were recommendations warranted by these results. These results and recommendations are discussed in more detail below.  CANCER HISTORY:  Oncology History   No history exists.    FAMILY HISTORY:  We obtained a detailed, 4-generation family history.  Significant diagnoses are listed below: Family History  Problem Relation Age of Onset   Hypertension Mother    CAD Mother    Diabetes Father    Hypertension Father    Colon cancer Father 57   Hypertension Sister    Breast cancer Maternal Aunt 89   Vaginal cancer Maternal Aunt 43   Breast cancer Maternal Aunt 57   Breast cancer Maternal Aunt 69   Stomach cancer Paternal Aunt 42   Cancer Maternal Grandmother        NOS   Breast cancer Cousin 35       d. 70; maternal first cousin   Leukemia Cousin 79       d. 88; maternal first cousin   Breast cancer Cousin 43       paternal first cousin   Healthy Half-Brother    Healthy Half-Sister     The patient has a son and daughter, her son has passed away.  She has a full brother and a paternal half brother and sister who are cancer free.  Both parents are living.   The patient's father had colon cancer at 66.  He had two brothers and two sisters.  One sister had stomach cancer at 75.  She has a daughter who had breast cancer at 64.  The paternal grandparents are deceased from non-cancer related issues.   The patient's mother does not have cancer.  She has five brothers and five sisters.  Three sisters had breast cancer, one had a son with leukemia and a second had a daughter with breast cancer at  52.  The maternal grandmother had an unknown cancer at 3.   Olivia [name] is unaware of previous family history of genetic testing for hereditary cancer risks. Patient's maternal ancestors are of African American descent, and paternal ancestors are of African American descent. There is no reported Ashkenazi Jewish ancestry. There is no known consanguinity.  GENETIC TEST RESULTS: Genetic testing reported out on Jun 27, 2022 through the Multi-cancer + RNA cancer panel found no pathogenic mutations. The Multi-Cancer + RNA Panel offered by [facility] includes sequencing and/or deletion/duplication analysis of the following 70 genes:  AIP*, ALK, APC*, ATM*, AXIN2*, BAP1*, BARD1*, BLM*, BMPR1A*, BRCA1*, BRCA2*, BRIP1*, CDC73*, CDH1*, CDK4, CDKN1B*, CDKN2A, CHEK2*, CTNNA1*, DICER1*, EPCAM (del/dup only), EGFR, FH*, FLCN*, GREM1 (promoter dup only), HOXB13, KIT, LZTR1, MAX*, MBD4, MEN1*, MET, MITF, MLH1*, MSH2*, MSH3*, MSH6*, MUTYH*, NF1*, NF2*, NTHL1*, PALB2*, PDGFRA, PMS2*, POLD1*, POLE*, POT1*, PRKAR1A*, PTCH1*, PTEN*, RAD51C*, RAD51D*, RB1*, RET, SDHA* (sequencing only), SDHAF2*, SDHB*, SDHC*, SDHD*, SMAD4*, SMARCA4*, SMARCB1*, SMARCE1*, STK11*, SUFU*, TMEM127*, TP53*, TSC1*, TSC2*, VHL*. RNA analysis is performed for * genes. The test report has been scanned into EPIC and is located under the Molecular Pathology section of the Results Review tab.  A portion of the result report is included below for reference.     We discussed  with Olivia [name] that because current genetic testing is not perfect, it is possible there may be a gene mutation in one of these genes that current testing cannot detect, but that chance is small.  We also discussed, that there could be another gene that has not yet been discovered, or that we have not yet tested, that is responsible for the cancer diagnoses in the family. It is also possible there is a hereditary cause for the cancer in the family that Olivia [name] did not inherit and therefore  was not identified in her testing.  Therefore, it is important to remain in touch with cancer genetics in the future so that we can continue to offer Olivia [name] the most up to date genetic testing.   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  At this time, it is unknown if this variant is associated with increased cancer risk or if this is a normal finding, but most variants such as this get reclassified to being inconsequential. It should not be used to make medical management decisions. With time, we suspect the lab will determine the significance of this variant, if any. If we do learn more about it, we will try to contact Olivia [name] to discuss it further. However, it is important to stay in touch with [place] periodically and keep the address and phone number up to date.  ADDITIONAL GENETIC TESTING: We discussed with Olivia [name] that her genetic testing was fairly extensive.  If there are genes identified to increase cancer risk that can be analyzed in the future, we would be happy to discuss and coordinate this testing at that time.    CANCER SCREENING RECOMMENDATIONS: Olivia [name] test result is considered negative (normal).  This means that we have not identified a hereditary cause for her personal and family history of breast cancer at this time. Most cancers happen by chance and this negative test suggests that her cancer may fall into this category.    Possible reasons for Olivia [name]'s negative genetic test include:  1. There may be a gene mutation in one of these genes that current testing methods cannot detect but that chance is small.  2. There could be another gene that has not yet been discovered, or that we have not yet tested, that is responsible for the cancer diagnoses in the family.  3.  There may be no hereditary risk for cancer in the family. The cancers in Olivia [name] and/or her family may be  sporadic/familial or due to other genetic and environmental factors. 4. It is also possible there is a hereditary cause for the cancer in the family that Olivia [name] did not inherit.  Therefore, it is recommended she continue to follow the cancer management and screening guidelines provided by her oncology and primary healthcare provider. An individual's cancer risk and medical management are not determined by genetic test results alone. Overall cancer risk assessment incorporates additional factors, including personal medical history, family history, and any available genetic information that may result in a personalized plan for cancer prevention and surveillance   RECOMMENDATIONS FOR FAMILY MEMBERS:  Individuals in this family might be at some increased risk of developing cancer, over the general population risk, simply due to the family history of cancer.  We recommended women in this family have a yearly mammogram beginning at age 29, or 8 years younger than the earliest onset of cancer, an annual clinical  breast exam, and perform monthly breast self-exams. Women in this family should also have a gynecological exam as recommended by their primary provider. All family members should be referred for colonoscopy starting at age 45.  FOLLOW-UP: Lastly, we discussed with Olivia [name] that cancer genetics is a rapidly advancing field and it is possible that new genetic tests will be appropriate for her and/or her family members in the future. We encouraged her to remain in contact with cancer genetics on an annual basis so we can update her personal and family histories and let her know of advances in cancer genetics that may benefit this family.   Our contact number was provided. Olivia [name] questions were answered to her satisfaction, and she knows she is welcome to call us at anytime with additional questions or concerns.   Maylon Cos, MS, [facility] Licensed, Certified Genetic  Counselor [email or web address]

## 2022-07-02 NOTE — Telephone Encounter (Signed)
Revealed negative genetic testing.  Discussed that we do not know why she has breast cancer or why there is cancer in the family. It could be due to a different gene that we are not testing, or maybe our current technology may not be able to pick something up.  It will be important for her to keep in contact with genetics to keep up with whether additional testing may be needed. 

## 2022-07-16 NOTE — Progress Notes (Signed)
Olivia [name] D.[facility] [address] [place] [address] Phone: [phone]   Assessment and Plan:     1. Chronic right shoulder pain 2. Acute left-sided thoracic back pain  -Chronic with exacerbation, subsequent visit - Overall significant improvement in upper back pain after completing course of meloxicam, however patient's acute on chronic flare of right shoulder pain has continued without improvement with meloxicam - Discontinue meloxicam and use remaining as needed - May use Tylenol for day-to-day pain relief - Patient elected for subacromial CSI to right shoulder due to ongoing pain.  Tolerated well per note below.  Procedure: Subacromial Injection Side: Right  Risks explained and consent was given verbally. The site was cleaned with alcohol prep. A steroid injection was performed from posterior approach using 2mL of 1% lidocaine without epinephrine and 1mL of kenalog 40mg /ml. This was well tolerated and resulted in symptomatic relief.  Needle was removed, hemostasis achieved, and post injection instructions were explained.   Pt was advised to call or return to clinic if these symptoms worsen or fail to improve as anticipated.   Pertinent previous records reviewed include none   Follow Up: 4 weeks for reevaluation.  If no improvement or worsening of symptoms, could consider physical therapy versus advanced imaging   Subjective:   I, Olivia [name], am serving as a Neurosurgeon for Doctor [name]   Chief Complaint: thoracic back pain    HPI:    06/18/2022 Patient is a 52 year old female complaining of thoracic back pain. Patient states left sided mid thoracic back pain that has been ongoing for the past month . Reports her pain got much worse starting 05/22/2022 and she developed a dry intermittent cough. She is unsure what triggered her back pain, she does remember her after her shower trying to bend over and dry her legs and after  that she noticed the back pain. She had tried 1 muscle relaxer without relief, has tried warm compress, massage, ice and multiple other at home remedies without relief. She denies any previous back injuries, denies any falls or trauma to her back. She denies any numbness, tingling or incontinence. Musculoskeletal tenderness to palpation of the area. Reports she is unable to take deep breaths due to the pain, has been unable to sleep on her left side due to the pain. She was given naproxen and tht has helped . Right shoulder pain that started around the same time and that has gotten worse since then , pain when she lays on her side that pain radiates into her neck , does endorse some numbness and tingling that goes down to her forearm    07/23/2022 Patient states that thoracic has been doing well but now the pain has shifted to her shoulder     Relevant Historical Information: hx of breast cancer   Additional pertinent review of systems negative.   Current Outpatient Medications:    furosemide (LASIX) 20 MG tablet, Take 20 mg by mouth daily., Disp: , Rfl:    losartan-hydrochlorothiazide (HYZAAR) 100-25 MG tablet, Take 1 tablet by mouth daily., Disp: , Rfl:    meloxicam (MOBIC) 15 MG tablet, Take 1 tablet (15 mg total) by mouth daily., Disp: 30 tablet, Rfl: 0   methocarbamol (ROBAXIN) 500 MG tablet, Take 1 tablet (500 mg total) by mouth 2 (two) times daily., Disp: 20 tablet, Rfl: 0   tiZANidine (ZANAFLEX) 4 MG tablet, Take 4 mg by mouth at bedtime as needed., Disp: ,  Rfl:    Objective:     Vitals:   07/23/22 1331  Pulse: 96  SpO2: 100%  Weight: (!) 305 lb (138.3 kg)  Height: 5\' 9"  (1.753 m)      Body mass index is 45.04 kg/m.    Physical Exam:    Gen: Appears well, nad, nontoxic and pleasant Neuro:sensation intact, strength is 5/5 with df/pf/inv/ev, muscle tone wnl Skin: no suspicious lesion or defmority Psych: A&O, appropriate mood and affect  Right shoulder:  No deformity,  swelling or muscle wasting No scapular winging FF 100, abd 90, int 30, ext 70 TTP clavicle, AC, coracoid, biceps groove, humerus, deltoid, trapezius NTTP over the [place],  cervical spine Positive neer, hawkins, empty can, obriens, crossarm, subscap liftoff, speeds Neg ant drawer, sulcus sign, apprehension Negative Spurling's test bilat FROM of neck    Electronically signed by:  Olivia [name] D.[facility] 1:47 PM 07/23/22

## 2022-07-23 ENCOUNTER — Ambulatory Visit: Payer: Managed Care, Other (non HMO) | Admitting: Sports Medicine

## 2022-07-23 VITALS — HR 96 | Ht 69.0 in | Wt 305.0 lb

## 2022-07-23 DIAGNOSIS — M25511 Pain in right shoulder: Secondary | ICD-10-CM | POA: Diagnosis not present

## 2022-07-23 DIAGNOSIS — G8929 Other chronic pain: Secondary | ICD-10-CM | POA: Diagnosis not present

## 2022-07-23 DIAGNOSIS — M546 Pain in thoracic spine: Secondary | ICD-10-CM | POA: Diagnosis not present

## 2022-07-23 NOTE — Patient Instructions (Signed)
Shoulder HEP 4 week follow up  

## 2022-07-27 NOTE — Therapy (Signed)
OUTPATIENT PHYSICAL THERAPY  UPPER EXTREMITY ONCOLOGY EVALUATION  Patient Name: Olivia [name] MRN: [id number] DOB:[birth year], 52 y.o., female Today's Date: 07/27/2022  END OF SESSION:   Past Medical History:  Diagnosis Date   Breast cancer (HCC)    left   Essential hypertension 03/18/2022   Family history of breast cancer    Family history of breast cancer    Family history of colon cancer    Family history of early CAD 03/18/2022   Family history of stomach cancer    Lymphedema    Neuropathy    LE   Torn rotator cuff    left   Past Surgical History:  Procedure Laterality Date   ABDOMINAL HYSTERECTOMY     BREAST LUMPECTOMY Left    LYMPHADENECTOMY     TUBAL LIGATION     Patient Active Problem List   Diagnosis Date Noted   Genetic testing 06/30/2022   Family history of breast cancer 06/23/2022   Family history of colon cancer 06/23/2022   Family history of stomach cancer 06/23/2022   Personal history of breast cancer 06/23/2022   Family history of early CAD 03/18/2022   Essential hypertension 03/18/2022   Left rotator cuff tear 10/08/2020    PCP: Ardean Larsen MD  REFERRING PROVIDER: Ardean Larsen MD  REFERRING DIAG: LEFT UE Lymphedema  THERAPY DIAG:  No diagnosis found.  ONSET DATE: ***  Rationale for Evaluation and Treatment: Rehabilitation  SUBJECTIVE:                                                                                                                                                                                           SUBJECTIVE STATEMENT: ***  PERTINENT HISTORY:   Left breast cancer IDC stage 2 with lumpectomy and bilateral reduction and lift and 0/2 nodes removed for Triple Negative Breast Cancer on 10/14/19 in [place]. Radiation and chemotherapy completed. 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   PAIN:  Are you having pain? {yes/no:20286} NPRS scale: ***/10 Pain location: *** Pain orientation: {Pain  Orientation:25161}  PAIN TYPE: {type:313116} Pain description: {PAIN DESCRIPTION:21022940}  Aggravating factors: *** Relieving factors: ***  PRECAUTIONS: Left UE lymphedema, Known Torn left RTC  WEIGHT BEARING RESTRICTIONS: No  FALLS:  Has patient fallen in last 6 months? {fallsyesno:27318}  LIVING ENVIRONMENT: Lives with: {[facility] lives with:25569::"lives with their family"} Lives in: {Lives in:25570} Stairs: {yes/no:20286}; {Stairs:24000} Has following equipment at home: Lymphedema pump?  OCCUPATION: ***  LEISURE: ***  HAND DOMINANCE: right   PRIOR LEVEL OF FUNCTION: Independent  PATIENT GOALS: ***   OBJECTIVE:  COGNITION: Overall cognitive status: Within functional limits for tasks  assessed   PALPATION: ***  OBSERVATIONS / OTHER ASSESSMENTS: ***  SENSATION: Light touch: {intact/deficits:24005} POSTURE: forward head, rounded shoulders  UPPER EXTREMITY AROM/PROM:  A/PROM RIGHT   eval   Shoulder extension   Shoulder flexion   Shoulder abduction   Shoulder internal rotation   Shoulder external rotation     (Blank rows = not tested)  A/PROM LEFT   eval  Shoulder extension   Shoulder flexion   Shoulder abduction   Shoulder internal rotation   Shoulder external rotation     (Blank rows = not tested)  CERVICAL AROM: All within functional limits:   UPPER EXTREMITY STRENGTH:   LYMPHEDEMA ASSESSMENTS:   SURGERY TYPE/DATE: 10/14/2019 Left Lumpectomy with SLNB and bilateral reduction  NUMBER OF LYMPH NODES REMOVED: 0/2  CHEMOTHERAPY: YES  RADIATION:YES  HORMONE TREATMENT: No  INFECTIONS: ***   LYMPHEDEMA ASSESSMENTS:   LANDMARK RIGHT  eval  At axilla    15 cm proximal to olecranon process   10 cm proximal to olecranon process   Olecranon process   15 cm proximal to ulnar styloid process   10 cm proximal to ulnar styloid process   Just proximal to ulnar styloid process   Across hand at thumb web space   At base of 2nd digit   (Blank  rows = not tested)  LANDMARK LEFT  eval  At axilla    15 cm proximal to olecranon process   10 cm proximal to olecranon process   Olecranon process   15 cm proximal to ulnar styloid process   10 cm proximal to ulnar styloid process   Just proximal to ulnar styloid process   Across hand at thumb web space   At base of 2nd digit   (Blank rows = not tested)  SOZO screen     L-DEX LYMPHEDEMA SCREENING    Measurement Type Unilateral     L-DEX MEASUREMENT EXTREMITY Upper Extremity     POSITION  Standing     DOMINANT SIDE Right     At Risk Side Left     L-DEX SCORE (UNILATERAL)        FUNCTIONAL TESTS:  {Functional tests:24029}  GAIT: Distance walked: *** Assistive device utilized: {Assistive devices:23999} Level of assistance: {Levels of assistance:24026} Comments: ***   QUICK DASH SURVEY: ***   TODAY'S TREATMENT:                                                                                                                                          DATE: ***    PATIENT EDUCATION:  Education details: *** Person educated: {Person educated:25204} Education method: {Education Method:25205} Education comprehension: {Education Comprehension:25206}  HOME EXERCISE PROGRAM: ***  ASSESSMENT:  CLINICAL IMPRESSION: Patient is a 52 y.o. female who was seen today for physical therapy evaluation and treatment for complaints of left UE lymphedema. She is s/p surgery for Triple Negative Left Breast  Cancer with SLNB on 10/14/2022 with neoadjuvant chemo, and adjuvant radiation.She has a lymphedema pump and compression garments.   OBJECTIVE IMPAIRMENTS: {opptimpairments:25111}.   ACTIVITY LIMITATIONS: {activitylimitations:27494}  PARTICIPATION LIMITATIONS: {participationrestrictions:25113}  PERSONAL FACTORS: {Personal factors:25162} are also affecting patient's functional outcome.   REHAB POTENTIAL: {rehabpotential:25112}  CLINICAL DECISION MAKING: {clinical decision  making:25114}  EVALUATION COMPLEXITY: {Evaluation complexity:25115}  GOALS: Goals reviewed with patient? {yes/no:20286}  SHORT TERM GOALS: Target date: ***  *** Baseline: Goal status: {GOALSTATUS:25110}  2.  *** Baseline:  Goal status: {GOALSTATUS:25110}  3.  *** Baseline:  Goal status: {GOALSTATUS:25110}  4.  *** Baseline:  Goal status: {GOALSTATUS:25110}  5.  *** Baseline:  Goal status: {GOALSTATUS:25110}  6.  *** Baseline:  Goal status: {GOALSTATUS:25110}  LONG TERM GOALS: Target date: ***  *** Baseline:  Goal status: {GOALSTATUS:25110}  2.  *** Baseline:  Goal status: {GOALSTATUS:25110}  3.  *** Baseline:  Goal status: {GOALSTATUS:25110}  4.  *** Baseline:  Goal status: {GOALSTATUS:25110}  5.  *** Baseline:  Goal status: {GOALSTATUS:25110}  6.  *** Baseline:  Goal status: {GOALSTATUS:25110}  PLAN:  PT FREQUENCY: {rehab frequency:25116}  PT DURATION: {rehab duration:25117}  PLANNED INTERVENTIONS: {rehab planned interventions:25118::"Therapeutic exercises","Therapeutic activity","Neuromuscular re-education","Balance training","Gait training","Patient/Family education","Self Care","Joint mobilization"}  PLAN FOR NEXT SESSION: ***  Waynette Buttery, PT 07/27/2022, 7:56 PM

## 2022-07-28 ENCOUNTER — Ambulatory Visit: Payer: Managed Care, Other (non HMO) | Attending: Internal Medicine

## 2022-07-28 ENCOUNTER — Other Ambulatory Visit: Payer: Self-pay

## 2022-07-28 DIAGNOSIS — M25512 Pain in left shoulder: Secondary | ICD-10-CM | POA: Insufficient documentation

## 2022-07-28 DIAGNOSIS — I890 Lymphedema, not elsewhere classified: Secondary | ICD-10-CM | POA: Diagnosis present

## 2022-07-28 DIAGNOSIS — M25612 Stiffness of left shoulder, not elsewhere classified: Secondary | ICD-10-CM | POA: Diagnosis present

## 2022-07-28 DIAGNOSIS — G8929 Other chronic pain: Secondary | ICD-10-CM | POA: Diagnosis present

## 2022-08-12 ENCOUNTER — Ambulatory Visit: Payer: Managed Care, Other (non HMO)

## 2022-08-12 DIAGNOSIS — I890 Lymphedema, not elsewhere classified: Secondary | ICD-10-CM

## 2022-08-12 NOTE — Therapy (Signed)
OUTPATIENT PHYSICAL THERAPY  UPPER EXTREMITY ONCOLOGY EVALUATION  Patient Name: Olivia [name] MRN: [id number] DOB:October 13, 1970, 52 y.o., female Today's Date: 08/12/2022  END OF SESSION:  PT End of Session - 08/12/22 1401     Visit Number 2    Number of Visits 8    Date for PT Re-Evaluation 08/25/22    PT Start Time 1403    PT Stop Time 1447    PT Time Calculation (min) 44 min    Activity Tolerance Patient tolerated treatment well    Behavior During Therapy [facility] for tasks assessed/performed             Past Medical History:  Diagnosis Date   Breast cancer (HCC)    left   Essential hypertension 03/18/2022   Family history of breast cancer    Family history of breast cancer    Family history of colon cancer    Family history of early CAD 03/18/2022   Family history of stomach cancer    Lymphedema    Neuropathy    LE   Torn rotator cuff    left   Past Surgical History:  Procedure Laterality Date   ABDOMINAL HYSTERECTOMY     BREAST LUMPECTOMY Left    LYMPHADENECTOMY     TUBAL LIGATION     Patient Active Problem List   Diagnosis Date Noted   Genetic testing 06/30/2022   Family history of breast cancer 06/23/2022   Family history of colon cancer 06/23/2022   Family history of stomach cancer 06/23/2022   Personal history of breast cancer 06/23/2022   Family history of early CAD 03/18/2022   Essential hypertension 03/18/2022   Left rotator cuff tear 10/08/2020    PCP: Ardean Larsen MD  REFERRING PROVIDER: Ardean Larsen MD  REFERRING DIAG: LEFT UE Lymphedema  THERAPY DIAG:  Lymphedema  ONSET DATE: March 2024  Rationale for Evaluation and Treatment: Rehabilitation  SUBJECTIVE:                                                                                                                                                                                           SUBJECTIVE STATEMENT:  I left the wrap on until the next day. It had gone down pretty well.   Pts husband Peyton Najjar in attendance to learn wrapping. Pt has only been allowed 45 min appts by her work.   EVAL 07/28/22 Pt noticed swelling in the back of her hand and fingers in Late March and it has gotten worse. I had swelling in 2021 with chemo and got a blood clot in port. She got swelling in her left arm and hand. She  was placed on Lovenox and once the clot cleared everything went back to normal. She has no pain in the arm, but when the hand swells she can get pain. Tying to sleep with hand elevated. Pt has a Flexi touch and she used it in May,then boyfriend had an accident and she has had to help him so she hasn't had time to use it. It did help some.  PERTINENT HISTORY:   Left breast cancer IDC stage 2 with lumpectomy and bilateral reduction and lift and 0/2 nodes removed for Triple Negative Breast Cancer on 10/14/19 in [place]. Radiation and chemotherapy completed. 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   PAIN:  Are you having pain? Yes NPRS scale: 4-5/10 Pain location: left hand and fingers Pain orientation: Left hand PAIN TYPE: aching, throbbing, and tight Pain description: intermittent  Aggravating factors: when hand is more swollen,a lot of typing Relieving factors: elevation  PRECAUTIONS: Left UE lymphedema, Known Torn left RTC  WEIGHT BEARING RESTRICTIONS: No  FALLS:  Has patient fallen in last 6 months? No  LIVING ENVIRONMENT: Lives with: boyfriend and her mom Lives in: House/apartment Stairs: Yes; External: 5 steps; can reach both Has following equipment at home: Lymphedema pump?  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 Did not want to address today  A/PROM RIGHT   eval   Shoulder extension   Shoulder flexion   Shoulder abduction   Shoulder internal rotation   Shoulder external rotation     (Blank rows = not tested)  A/PROM LEFT   eval  Shoulder extension   Shoulder flexion   Shoulder abduction   Shoulder internal rotation   Shoulder external rotation     (Blank rows = not tested)  CERVICAL AROM: All within functional limits:   UPPER EXTREMITY STRENGTH:   LYMPHEDEMA ASSESSMENTS:   SURGERY TYPE/DATE: 10/14/2019 Left Lumpectomy with SLNB and bilateral reduction  NUMBER OF LYMPH NODES REMOVED: 0/2  CHEMOTHERAPY: YES  RADIATION:YES  HORMONE TREATMENT: No    LYMPHEDEMA ASSESSMENTS:   LANDMARK RIGHT  eval  At axilla  46.9  15 cm proximal to olecranon process 42.8  10 cm proximal to olecranon process 41.8  Olecranon process 32.9  15 cm proximal to ulnar styloid process 31.4  10 cm proximal to ulnar styloid process 27.7  Just proximal to ulnar styloid process 19.8  Across hand at thumb web space 21.7  At base of 2nd digit 6.5  (Blank rows = not tested)  LANDMARK LEFT  eval  At axilla  45.7  15 cm proximal to olecranon process 41.7  10 cm proximal to olecranon process 40.3  Olecranon process 33.1  15 cm proximal to ulnar styloid process 31.3  10 cm proximal to ulnar styloid process 27.7  Just proximal to ulnar styloid process 22.0  Across hand at thumb web space 23.4  At base of 2nd digit 7.2  (Blank rows = not tested)    FUNCTIONAL TESTS:    Lymphedema life Impact scale: 54%  TODAY'S TREATMENT:                                                                                                                                           DATE:   08/11/22 Pts husband instructed in compression bandaging after watching therapist demonstrate each technique; TG soft applied to arm. Elastomull applied to fingers 1-5 and practiced by husband 2 times with pt  and PT giving VC's to help.  Half inch foam placed over elastomull on dorsum of hand and TG soft pulled down over it. 6 cm hand wrap applied by therapist to demonstrate and reapplied by husband x 1 with occasional VC's. Therapist then applied 8 cm wrap wrist to elbow with TG soft pulled over it. Printed video instructions for caregiver and self wrapping. Pt performed check of capillary refill and was advised to loosen wraps with wrist ROM and circles. Advised to re-apply a new wrap each day.   07/28/22 Educated pt in POC and wrapped pt so she could see what it would be like; used med TG soft, mollelast to fingers 1-5, 6 cm artiflex wrist to axilla, 6 cm hand wrap, and 1 12 cm wrap wrist to axilla with X at elbow. Pt mainly swollen wrist into hand and fingers. May be able to bandage to elbow? Pt to remove if having discomfort, pain, numbness or if it slides. May remove tonight or tomorrow prn. Advised to bring caregiver to learn wrapping.    PATIENT EDUCATION:  Education details: POC, CDT, compression garments Person educated: Patient Education method: Medical illustrator Education comprehension: verbalized understanding, needs further education  HOME EXERCISE PROGRAM: NA  ASSESSMENT:  CLINICAL IMPRESSION: Patient is a 52 y.o. female who was seen today for physical therapy evaluation and treatment for complaints of left UE lymphedema. She is s/p surgery for Triple Negative Left Breast Cancer with SLNB on 10/14/2019 with neoadjuvant chemo, and adjuvant radiation.She has a lymphedema pump but no compression garments. Her wrist, hand and fingers started swelling in late march and have gotten slightly worse, but does reduce with elevation. .She has been advised to elevate  and to try to increase frequency with her Flexi touch.  She was wrapped by therapist [phone] above) and advised to remove tomorrow or if it becomes uncomfortable or slides down.She needs to check with work to get time off for appts. She will benefit from skilled PT to address swelling, Instruct pt and caregiver in bandaging and MLD, and get fit for proper compression garments.   OBJECTIVE IMPAIRMENTS: decreased knowledge of condition, increased edema, and impaired UE functional use.   ACTIVITY LIMITATIONS: lifting and reach over head  PARTICIPATION LIMITATIONS:  participates in most but with increased swelling  PERSONAL FACTORS: 1-2 comorbidities: Left breast Cancer s/p radiation and chemo  are also affecting patient's functional outcome.   REHAB POTENTIAL: Good  CLINICAL DECISION MAKING: Stable/uncomplicated  EVALUATION  COMPLEXITY: Low  GOALS: Goals reviewed with patient? Yes  SHORT TERM GOALS=LONG TERM GOALS: Target date: 08/25/2022  Pt /caregiver will be independent in compression bandaging to left hand/arm Baseline:no knowledge Goal status: INITIAL  2.  Pt will have decreased finger swelling by atleast .4 cm Baseline: 7.2 Goal status: INITIAL  3.  Pt will have decreased wrist swelling by 1-2 cm Baseline: 22 cm Goal status: INITIAL  4.  Pt will have decreased hand swelling by atleast 1 cm Baseline: 23.4 Goal status: INITIAL  5.  Pt will be fit for appropriate compression garments for daytime use Baseline:  Goal status: INITIAL  6.  Pt will be independent with self MLD to reduce swelling Baseline:  Goal status: INITIAL  PLAN:  PT FREQUENCY: 2x/week  PT DURATION: 4 weeks  PLANNED INTERVENTIONS: Therapeutic exercises, Patient/Family education, Self Care, Orthotic/Fit training, Manual lymph drainage, Compression bandaging, Manual therapy, and Re-evaluation  PLAN FOR NEXT SESSION: Measure ,review pt/caregiver  compression bandaging,(mainly swollen wrist to hand ?  Wrap to elbow vs upper arm), MLD, fit for garments as soon as ready. (Pt types all day and is having to see if she can get time off of work.) pt can only have 45 min appts from work  [employer], PT 08/12/2022, 2:48 PM

## 2022-08-12 NOTE — Progress Notes (Signed)
Aleen Sells D.[facility] [address] [place] [address] Phone: [phone]   Assessment and Plan:     1. Chronic right shoulder pain - Chronic with exacerbation, subsequent visit - Continued right shoulder pain with overall minimal to no change after subacromial CSI previous office visit on 07/23/2022.  Unclear etiology of patient's ongoing right shoulder pain though differential includes impingement syndrome, intra-articular pathology, AC joint pathology - Continue HEP and start physical therapy.  Referral sent - May continue to use meloxicam as needed for severe breakthrough pain.  Recommend not using NSAIDs more frequently than 1-2 times per week for long-term pain relief - Recommend using Tylenol for day-to-day pain relief   Pertinent previous records reviewed include none   Follow Up: 3 weeks for reevaluation.  Would review x-ray at that time.  If no improvement or worsening of symptoms, could consider intra-articular versus AC joint CSI versus advanced imaging   Subjective:   I, [name], am serving as a Neurosurgeon for Doctor [name]   Chief Complaint: thoracic back pain    HPI:    06/18/2022 Patient is a 52 year old female complaining of thoracic back pain. Patient states left sided mid thoracic back pain that has been ongoing for the past month . Reports her pain got much worse starting 05/22/2022 and she developed a dry intermittent cough. She is unsure what triggered her back pain, she does remember her after her shower trying to bend over and dry her legs and after that she noticed the back pain. She had tried 1 muscle relaxer without relief, has tried warm compress, massage, ice and multiple other at home remedies without relief. She denies any previous back injuries, denies any falls or trauma to her back. She denies any numbness, tingling or incontinence. Musculoskeletal tenderness to palpation of the area. Reports she is  unable to take deep breaths due to the pain, has been unable to sleep on her left side due to the pain. She was given naproxen and tht has helped . Right shoulder pain that started around the same time and that has gotten worse since then , pain when she lays on her side that pain radiates into her neck , does endorse some numbness and tingling that goes down to her forearm     07/23/2022 Patient states that thoracic has been doing well but now the pain has shifted to her shoulder    08/20/2022 Patient states that she still gets some flare ups but much better than where we started, CSI didn't help much     Relevant Historical Information: hx of breast cancer   Additional pertinent review of systems negative.   Current Outpatient Medications:    furosemide (LASIX) 20 MG tablet, Take 20 mg by mouth daily., Disp: , Rfl:    losartan-hydrochlorothiazide (HYZAAR) 100-25 MG tablet, Take 1 tablet by mouth daily., Disp: , Rfl:    meloxicam (MOBIC) 15 MG tablet, Take 1 tablet (15 mg total) by mouth daily., Disp: 30 tablet, Rfl: 0   methocarbamol (ROBAXIN) 500 MG tablet, Take 1 tablet (500 mg total) by mouth 2 (two) times daily., Disp: 20 tablet, Rfl: 0   tiZANidine (ZANAFLEX) 4 MG tablet, Take 4 mg by mouth at bedtime as needed., Disp: , Rfl:    Objective:     Vitals:   08/20/22 1327  BP: 108/78  Pulse: 84  SpO2: 99%  Weight: 297 lb (134.7 kg)  Height: 5\' 9"  (1.753  m)      Body mass index is 43.86 kg/m.    Physical Exam:    Gen: Appears well, nad, nontoxic and pleasant Neuro:sensation intact, strength is 5/5 with df/pf/inv/ev, muscle tone wnl Skin: no suspicious lesion or defmority Psych: A&O, appropriate mood and affect   Right shoulder:  No deformity, swelling or muscle wasting No scapular winging FF 100, abd 90, int 30, ext 70 TTP clavicle, AC, coracoid, biceps groove, humerus, deltoid, trapezius NTTP over the [place],  cervical spine Positive neer, hawkins, empty can, obriens,  crossarm, subscap liftoff, speeds Neg ant drawer, sulcus sign, apprehension Negative Spurling's test bilat FROM of neck     Electronically signed by:  Aleen Sells D.[facility] 1:48 PM 08/20/22

## 2022-08-12 NOTE — Patient Instructions (Signed)
Self bandaging the arm:   Follow along with this video:  Https://www.[email or web address]/[email or web address]  -OR-  Search in your internet browser: "self bandaging arm MD [facility]"   And the video should appear in the video search results "Lymphedema Management: Self bandaging your arm" on YouTube   This video is for caregiver assistance:  https://www.youtube.com/watch?[email or web address]  -OR-  Enter " Lymphedema Management: Caregiver Bandaging you arm" into your search browser and your video should appear in the results   

## 2022-08-14 ENCOUNTER — Ambulatory Visit: Payer: Managed Care, Other (non HMO) | Admitting: Physical Therapy

## 2022-08-14 ENCOUNTER — Encounter: Payer: Self-pay | Admitting: Physical Therapy

## 2022-08-14 DIAGNOSIS — G8929 Other chronic pain: Secondary | ICD-10-CM

## 2022-08-14 DIAGNOSIS — M25612 Stiffness of left shoulder, not elsewhere classified: Secondary | ICD-10-CM

## 2022-08-14 DIAGNOSIS — I890 Lymphedema, not elsewhere classified: Secondary | ICD-10-CM

## 2022-08-14 NOTE — Therapy (Signed)
OUTPATIENT PHYSICAL THERAPY  UPPER EXTREMITY ONCOLOGY TREATMENT  Patient Name: Olivia [name] MRN: [id number] DOB:04-Aug-1970, 52 y.o., female Today's Date: 08/14/2022  END OF SESSION:  PT End of Session - 08/14/22 1208     Visit Number 3    Number of Visits 8    Date for PT Re-Evaluation 08/25/22    PT Start Time 1207    PT Stop Time 1247    PT Time Calculation (min) 40 min    Activity Tolerance Patient tolerated treatment well    Behavior During Therapy [facility] for tasks assessed/performed             Past Medical History:  Diagnosis Date   Breast cancer (HCC)    left   Essential hypertension 03/18/2022   Family history of breast cancer    Family history of breast cancer    Family history of colon cancer    Family history of early CAD 03/18/2022   Family history of stomach cancer    Lymphedema    Neuropathy    LE   Torn rotator cuff    left   Past Surgical History:  Procedure Laterality Date   ABDOMINAL HYSTERECTOMY     BREAST LUMPECTOMY Left    LYMPHADENECTOMY     TUBAL LIGATION     Patient Active Problem List   Diagnosis Date Noted   Genetic testing 06/30/2022   Family history of breast cancer 06/23/2022   Family history of colon cancer 06/23/2022   Family history of stomach cancer 06/23/2022   Personal history of breast cancer 06/23/2022   Family history of early CAD 03/18/2022   Essential hypertension 03/18/2022   Left rotator cuff tear 10/08/2020    PCP: Ardean Larsen MD  REFERRING PROVIDER: Ardean Larsen MD  REFERRING DIAG: LEFT UE Lymphedema  THERAPY DIAG:  Lymphedema  Chronic left shoulder pain  Stiffness of left shoulder, not elsewhere classified  Lymphedema, not elsewhere classified  ONSET DATE: March 2024  Rationale for Evaluation and Treatment: Rehabilitation  SUBJECTIVE:                                                                                                                                                                                            SUBJECTIVE STATEMENT:  I kept the wrap on and I put it back on myself Wednesday evening and I just took it off at 10 am.    EVAL 07/28/22 Pt noticed swelling in the back of her hand and fingers in Late March and it has gotten worse. I had swelling in 2021 with chemo and got a blood clot in port. She got swelling  in her left arm and hand. She was placed on Lovenox and once the clot cleared everything went back to normal. She has no pain in the arm, but when the hand swells she can get pain. Tying to sleep with hand elevated. Pt has a Flexi touch and she used it in May,then boyfriend had an accident and she has had to help him so she hasn't had time to use it. It did help some.  PERTINENT HISTORY:   Left breast cancer IDC stage 2 with lumpectomy and bilateral reduction and lift and 0/2 nodes removed for Triple Negative Breast Cancer on 10/14/19 in [place]. Radiation and chemotherapy completed. 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   PAIN:  Are you having pain? No  PRECAUTIONS: Left UE lymphedema, Known Torn left RTC  WEIGHT BEARING RESTRICTIONS: No  FALLS:  Has patient fallen in last 6 months? No  LIVING ENVIRONMENT: Lives with: boyfriend and her mom Lives in: House/apartment Stairs: Yes; External: 5 steps; can reach both Has following equipment at home: Lymphedema pump?  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 Did not want to address today  A/PROM RIGHT   eval   Shoulder extension   Shoulder flexion   Shoulder abduction   Shoulder internal rotation   Shoulder external rotation     (Blank rows = not tested)  A/PROM LEFT   eval  Shoulder extension   Shoulder flexion   Shoulder abduction   Shoulder internal rotation   Shoulder external rotation     (Blank rows = not tested)  CERVICAL AROM: All within functional limits:   UPPER EXTREMITY STRENGTH:   LYMPHEDEMA ASSESSMENTS:   SURGERY TYPE/DATE: 10/14/2019 Left Lumpectomy with SLNB and bilateral reduction  NUMBER OF LYMPH NODES REMOVED: 0/2  CHEMOTHERAPY: YES  RADIATION:YES  HORMONE TREATMENT: No    LYMPHEDEMA ASSESSMENTS:   LANDMARK RIGHT  eval  At axilla  46.9  15 cm proximal to olecranon process 42.8  10 cm proximal to olecranon process 41.8  Olecranon process 32.9  15 cm proximal to ulnar styloid process 31.4  10 cm proximal to ulnar styloid process 27.7  Just proximal to ulnar styloid process 19.8  Across hand at thumb web space 21.7  At base of 2nd digit 6.5  (Blank rows = not tested)  LANDMARK LEFT  eval LEFT 08/14/22  At axilla  45.7   15 cm proximal to olecranon process 41.7 41.4  10 cm proximal to olecranon process 40.3 39.5  Olecranon process 33.1 32  15 cm proximal to ulnar styloid process 31.3 30  10  cm proximal to ulnar styloid process 27.7 26.8  Just proximal to ulnar styloid process 22.0 20.5  Across hand at thumb web space 23.4 22.5  At base of 2nd digit 7.2 7  (Blank rows = not tested)    FUNCTIONAL TESTS:    Lymphedema life Impact scale: 54%   TODAY'S TREATMENT:  DATE:  08/14/22 Remeasured circumferences. Pt demonstrates a reduction in circumferences. Began manual lymph drainage in supine as follows: short neck, right  axillary nodes, left inguinal nodes, superficial and deep abdominals; anterior inter-axillary anastamoses, left axillo-inguinal anastamoses. Left upper extremity from fingers and dorsal hand to lateral shoulder redirecting along pathways. Instructing pt throughout and having pt return demonstrate correct skin stretch technique.  Compression bandaging as follows: Elastomull to digits 1-5, TG soft from hand to forearm, 1/2 grey foam at dorsum of hand, 1 6 cm bandage at hand, 1 8 cm bandage from wrist to elbow  08/11/22 Pts husband instructed in compression bandaging after watching therapist demonstrate each technique; TG soft applied to arm. Elastomull applied to fingers 1-5 and practiced by husband 2 times with pt  and PT giving VC's to help.  Half inch foam placed over elastomull on dorsum of hand and TG soft pulled down over it. 6 cm hand wrap applied by therapist to demonstrate and reapplied by husband x 1 with occasional VC's. Therapist then applied 8 cm wrap wrist to elbow with TG soft pulled over it. Printed video instructions for caregiver and self wrapping. Pt performed check of capillary refill and was advised to loosen wraps with wrist ROM and circles. Advised to re-apply a new wrap each day.   07/28/22 Educated pt in POC and wrapped pt so she could see what it would be like; used med TG soft, mollelast to fingers 1-5, 6 cm artiflex wrist to axilla, 6 cm hand wrap, and 1 12 cm wrap wrist to axilla with X at elbow. Pt mainly swollen wrist into hand and fingers. May be able to bandage to elbow? Pt to remove if having discomfort, pain, numbness or if it slides. May remove tonight or tomorrow prn. Advised to bring caregiver to learn wrapping.    PATIENT EDUCATION:  Education details: POC, CDT, compression garments Person educated: Patient Education method: Medical illustrator Education comprehension: verbalized understanding, needs further education  HOME EXERCISE  PROGRAM: NA  ASSESSMENT:  CLINICAL IMPRESSION: Pt is feeling independent with self bandaging. Began instructing her in MLD technique for L UE and began instructing pt. Will issue instructions to pt at next session. Reapplied compression bandaging.    OBJECTIVE IMPAIRMENTS: decreased knowledge of condition, increased edema, and impaired UE functional use.   ACTIVITY LIMITATIONS: lifting and reach over head  PARTICIPATION LIMITATIONS:  participates in most but with increased swelling  PERSONAL FACTORS: 1-2 comorbidities: Left breast Cancer s/p radiation and chemo  are also affecting patient's functional outcome.   REHAB POTENTIAL: Good  CLINICAL DECISION MAKING: Stable/uncomplicated  EVALUATION COMPLEXITY: Low  GOALS: Goals reviewed with patient? Yes  SHORT TERM GOALS=LONG TERM GOALS: Target date: 08/25/2022  Pt /caregiver will be independent in compression bandaging to left hand/arm Baseline:no knowledge Goal status: INITIAL  2.  Pt will have decreased finger swelling by atleast .4 cm Baseline: 7.2 Goal status: INITIAL  3.  Pt will have decreased wrist swelling by 1-2 cm Baseline: 22 cm Goal status: INITIAL  4.  Pt will have decreased hand swelling by atleast 1 cm Baseline: 23.4 Goal status: INITIAL  5.  Pt will be fit for appropriate compression garments for daytime use Baseline:  Goal status: INITIAL  6.  Pt will be independent with self MLD to reduce swelling Baseline:  Goal status: INITIAL  PLAN:  PT FREQUENCY: 2x/week  PT DURATION: 4 weeks  PLANNED INTERVENTIONS: Therapeutic exercises, Patient/Family education, Self Care, Orthotic/Fit training, Manual lymph drainage, Compression  bandaging, Manual therapy, and Re-evaluation  PLAN FOR NEXT SESSION: Give handout for self MLD, Measure ,review pt/caregiver  compression bandaging,(mainly swollen wrist to hand ? Wrap to elbow vs upper arm), MLD, fit for garments as soon as ready. (Pt types all day and is having  to see if she can get time off of work.) pt can only have 45 min appts from work  [employer], PT 08/14/2022, 12:59 PM

## 2022-08-20 ENCOUNTER — Ambulatory Visit: Payer: Managed Care, Other (non HMO) | Admitting: Sports Medicine

## 2022-08-20 ENCOUNTER — Ambulatory Visit: Payer: Managed Care, Other (non HMO) | Attending: Internal Medicine

## 2022-08-20 ENCOUNTER — Ambulatory Visit: Payer: Managed Care, Other (non HMO)

## 2022-08-20 VITALS — BP 108/78 | HR 84 | Ht 69.0 in | Wt 297.0 lb

## 2022-08-20 DIAGNOSIS — I890 Lymphedema, not elsewhere classified: Secondary | ICD-10-CM | POA: Insufficient documentation

## 2022-08-20 DIAGNOSIS — G8929 Other chronic pain: Secondary | ICD-10-CM | POA: Insufficient documentation

## 2022-08-20 DIAGNOSIS — M25512 Pain in left shoulder: Secondary | ICD-10-CM | POA: Diagnosis present

## 2022-08-20 DIAGNOSIS — M25612 Stiffness of left shoulder, not elsewhere classified: Secondary | ICD-10-CM | POA: Diagnosis present

## 2022-08-20 DIAGNOSIS — M25511 Pain in right shoulder: Secondary | ICD-10-CM | POA: Diagnosis not present

## 2022-08-20 NOTE — Patient Instructions (Signed)
PT referral  3 week follow up

## 2022-08-20 NOTE — Therapy (Signed)
OUTPATIENT PHYSICAL THERAPY  UPPER EXTREMITY ONCOLOGY TREATMENT  Patient Name: Olivia [name] MRN: [id number] DOB:[birth year], 52 y.o., female Today's Date: 08/20/2022  END OF SESSION:  PT End of Session - 08/20/22 1403     Visit Number 4    Number of Visits 8    Date for PT Re-Evaluation 08/25/22    PT Start Time 1404    PT Stop Time 1500    PT Time Calculation (min) 56 min    Activity Tolerance Patient tolerated treatment well    Behavior During Therapy [facility] for tasks assessed/performed             Past Medical History:  Diagnosis Date   Breast cancer (HCC)    left   Essential hypertension 03/18/2022   Family history of breast cancer    Family history of breast cancer    Family history of colon cancer    Family history of early CAD 03/18/2022   Family history of stomach cancer    Lymphedema    Neuropathy    LE   Torn rotator cuff    left   Past Surgical History:  Procedure Laterality Date   ABDOMINAL HYSTERECTOMY     BREAST LUMPECTOMY Left    LYMPHADENECTOMY     TUBAL LIGATION     Patient Active Problem List   Diagnosis Date Noted   Genetic testing 06/30/2022   Family history of breast cancer 06/23/2022   Family history of colon cancer 06/23/2022   Family history of stomach cancer 06/23/2022   Personal history of breast cancer 06/23/2022   Family history of early CAD 03/18/2022   Essential hypertension 03/18/2022   Left rotator cuff tear 10/08/2020    PCP: Ardean Larsen MD  REFERRING PROVIDER: Ardean Larsen MD  REFERRING DIAG: LEFT UE Lymphedema  THERAPY DIAG:  Lymphedema  Chronic left shoulder pain  Stiffness of left shoulder, not elsewhere classified  Lymphedema, not elsewhere classified  ONSET DATE: March 2024  Rationale for Evaluation and Treatment: Rehabilitation  SUBJECTIVE:                                                                                                                                                                                            SUBJECTIVE STATEMENT:  I took the wrap off at 11:30 and I had a Drs. Appt.. I can make a morning appt next Friday to be measured for garments. Pt reports her work is only allowing her 4 visits for appts so next Monday is her last appt, but she will get time to come get measured for garments.   EVAL 07/28/22 Pt noticed swelling in  the back of her hand and fingers in Late March and it has gotten worse. I had swelling in 2021 with chemo and got a blood clot in port. She got swelling in her left arm and hand. She was placed on Lovenox and once the clot cleared everything went back to normal. She has no pain in the arm, but when the hand swells she can get pain. Tying to sleep with hand elevated. Pt has a Flexi touch and she used it in May,then boyfriend had an accident and she has had to help him so she hasn't had time to use it. It did help some.  PERTINENT HISTORY:   Left breast cancer IDC stage 2 with lumpectomy and bilateral reduction and lift and 0/2 nodes removed for Triple Negative Breast Cancer on 10/14/19 in [place]. Radiation and chemotherapy completed. 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   PAIN:  Are you having pain? No  PRECAUTIONS: Left UE lymphedema, Known Torn left RTC  WEIGHT BEARING RESTRICTIONS: No  FALLS:  Has patient fallen in last 6 months? No  LIVING ENVIRONMENT: Lives with: boyfriend and her mom Lives in: House/apartment Stairs: Yes; External: 5 steps; can reach both Has following equipment at home: Lymphedema pump?  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 Did not want to address today  A/PROM RIGHT   eval   Shoulder extension   Shoulder flexion   Shoulder abduction   Shoulder internal rotation   Shoulder external rotation     (Blank rows = not tested)  A/PROM LEFT   eval  Shoulder extension   Shoulder flexion   Shoulder abduction   Shoulder internal rotation   Shoulder external rotation     (Blank rows = not tested)  CERVICAL AROM: All within functional limits:   UPPER EXTREMITY STRENGTH:   LYMPHEDEMA ASSESSMENTS:   SURGERY TYPE/DATE: 10/14/2019 Left Lumpectomy with SLNB and bilateral reduction  NUMBER OF LYMPH NODES REMOVED: 0/2  CHEMOTHERAPY: YES  RADIATION:YES  HORMONE TREATMENT: No    LYMPHEDEMA ASSESSMENTS:   LANDMARK RIGHT  eval  At axilla  46.9  15 cm proximal to olecranon process 42.8  10 cm proximal to olecranon process 41.8  Olecranon process 32.9  15 cm proximal to ulnar styloid process 31.4  10 cm proximal to ulnar styloid process 27.7  Just proximal to ulnar styloid process 19.8  Across hand at thumb web space 21.7  At base of 2nd digit 6.5  (Blank rows = not tested)  LANDMARK LEFT  eval LEFT 08/14/22  At axilla  45.7   15 cm proximal to olecranon process 41.7 41.4  10 cm proximal to olecranon process 40.3 39.5  Olecranon process 33.1 32  15 cm proximal to ulnar styloid process 31.3 30  10  cm proximal to ulnar styloid process 27.7 26.8  Just proximal to ulnar styloid process 22.0 20.5  Across hand at thumb web space 23.4 22.5  At base of 2nd digit 7.2 7  (Blank rows = not tested)  FUNCTIONAL TESTS:    Lymphedema life Impact scale: 54%   TODAY'S TREATMENT:                                                                                                                                           DATE:  08/20/2022 Pt arrived without bandages on. Left hand improved but more swollen than just after she took off wrap per pt. Reviewed left UE MLD in sitting with therapist demonstrating and pt performing. short neck, right axillary nodes, left inguinal nodes, 5 breaths, anterior inter-axillary anastamoses, left axillo-inguinal anastamoses. Left upper extremity from upper outer arm, inner to outer arm,outer arm to shoulder and retracing pathways, then both sides of forearm retracing all steps, and ending with dorsal hand to lateral shoulder redirecting along pathways.She started experiencing significant pain in the right shoulder so therapist completed  all of left Left UE MLD in supine so pt could rest arm. She performed all LN activation, pathways and outer arm.  Compression bandaging as follows: Elastomull to digits 1-5, TG soft from hand to forearm, 1/2 grey foam at dorsum of hand, 1 6 cm bandage at hand, 1 8 cm bandage from wrist to elbow   08/14/22 Remeasured circumferences. Pt demonstrates a reduction in circumferences. Began manual lymph drainage in supine as follows: short neck, right axillary nodes, left inguinal nodes, superficial and deep abdominals; anterior inter-axillary anastamoses, left axillo-inguinal anastamoses. Left upper extremity from fingers and dorsal hand to lateral shoulder redirecting along pathways. Instructing pt throughout and having pt return demonstrate correct skin stretch technique.  Compression bandaging as follows: Elastomull to digits 1-5, TG soft from hand to forearm, 1/2 grey foam at dorsum of hand, 1 6 cm bandage at hand, 1 8 cm bandage from wrist to elbow  08/11/22 Pts husband instructed in compression bandaging after watching therapist demonstrate each technique; TG soft applied to arm. Elastomull applied to fingers 1-5 and practiced by husband 2 times with pt  and PT giving VC's to help.  Half inch foam placed over elastomull on dorsum of hand and TG soft  pulled down over it. 6 cm hand wrap applied by therapist to demonstrate and reapplied by husband x 1 with occasional VC's. Therapist then applied 8 cm wrap wrist to elbow with TG soft pulled over it. Printed video instructions for caregiver and self wrapping. Pt performed check of capillary refill and was advised to loosen wraps with wrist ROM and circles. Advised to re-apply a new wrap each day.   07/28/22 Educated pt in POC and wrapped pt so she could see what it would be like; used med TG soft, mollelast to fingers 1-5, 6 cm artiflex wrist to axilla, 6 cm hand wrap, and 1 12 cm wrap wrist to axilla with X at elbow. Pt mainly swollen wrist into hand and fingers. May be able to bandage to elbow? Pt to remove if  having discomfort, pain, numbness or if it slides. May remove tonight or tomorrow prn. Advised to bring caregiver to learn wrapping.    PATIENT EDUCATION:  Education details: POC, CDT, compression garments Person educated: Patient Education method: Medical illustrator Education comprehension: verbalized understanding, needs further education  HOME EXERCISE PROGRAM: NA  ASSESSMENT:  CLINICAL IMPRESSION: Pt started review of left UE self MLD  in sitting with therapist demonstrating/reading instructions and pt performing ,until pt developed significant right shoulder pain, and therapist continued with pt in supine completing all Left UE techniques with verbalization to pt. Pt was seeing orthopedic today after her appt for Xrays of the right shoulder. Pt was wrapped by therapist. She is scheduled to come in for garment measuring on Friday at  8:45  OBJECTIVE IMPAIRMENTS: decreased knowledge of condition, increased edema, and impaired UE functional use.   ACTIVITY LIMITATIONS: lifting and reach over head  PARTICIPATION LIMITATIONS:  participates in most but with increased swelling  PERSONAL FACTORS: 1-2 comorbidities: Left breast Cancer s/p radiation and chemo  are also  affecting patient's functional outcome.   REHAB POTENTIAL: Good  CLINICAL DECISION MAKING: Stable/uncomplicated  EVALUATION COMPLEXITY: Low  GOALS: Goals reviewed with patient? Yes  SHORT TERM GOALS=LONG TERM GOALS: Target date: 08/25/2022  Pt /caregiver will be independent in compression bandaging to left hand/arm Baseline:no knowledge Goal status: INITIAL  2.  Pt will have decreased finger swelling by atleast .4 cm Baseline: 7.2 Goal status: INITIAL  3.  Pt will have decreased wrist swelling by 1-2 cm Baseline: 22 cm Goal status: MET 08/14/2022 4.  Pt will have decreased hand swelling by atleast 1 cm Baseline: 23.4 Goal status: INITIAL  5.  Pt will be fit for appropriate compression garments for daytime use Baseline:  Goal status: INITIAL  6.  Pt will be independent with self MLD to reduce swelling Baseline:  Goal status: INITIAL  PLAN:  PT FREQUENCY: 2x/week  PT DURATION: 4 weeks  PLANNED INTERVENTIONS: Therapeutic exercises, Patient/Family education, Self Care, Orthotic/Fit training, Manual lymph drainage, Compression bandaging, Manual therapy, and Re-evaluation  PLAN FOR NEXT SESSION: Next visit is last per Pt. Due to work not allowing any others.Measure ,review pt/caregiver  compression bandaging,(mainly swollen wrist to hand ? Wrap to elbow vs upper arm), MLD, fit for garments as soon as ready. (Pt types all day and is having to see if she can get time off of work.) pt can only have 45 min appts from work  [employer], PT 08/20/2022, 5:16 PM

## 2022-08-25 ENCOUNTER — Ambulatory Visit: Payer: Managed Care, Other (non HMO)

## 2022-08-25 DIAGNOSIS — I890 Lymphedema, not elsewhere classified: Secondary | ICD-10-CM | POA: Diagnosis not present

## 2022-08-25 NOTE — Therapy (Addendum)
OUTPATIENT PHYSICAL THERAPY  UPPER EXTREMITY ONCOLOGY TREATMENT  Patient Name: Olivia [name] MRN: [id number] DOB:31-Jan-1971, 52 y.o., female Today's Date: 08/25/2022  END OF SESSION:  PT End of Session - 08/25/22 1401     Visit Number 5    Number of Visits 8    Date for PT Re-Evaluation 08/25/22    PT Start Time 1402    PT Stop Time 1450    PT Time Calculation (min) 48 min    Activity Tolerance Patient tolerated treatment well    Behavior During Therapy [facility] for tasks assessed/performed             Past Medical History:  Diagnosis Date   Breast cancer (HCC)    left   Essential hypertension 03/18/2022   Family history of breast cancer    Family history of breast cancer    Family history of colon cancer    Family history of early CAD 03/18/2022   Family history of stomach cancer    Lymphedema    Neuropathy    LE   Torn rotator cuff    left   Past Surgical History:  Procedure Laterality Date   ABDOMINAL HYSTERECTOMY     BREAST LUMPECTOMY Left    LYMPHADENECTOMY     TUBAL LIGATION     Patient Active Problem List   Diagnosis Date Noted   Genetic testing 06/30/2022   Family history of breast cancer 06/23/2022   Family history of colon cancer 06/23/2022   Family history of stomach cancer 06/23/2022   Personal history of breast cancer 06/23/2022   Family history of early CAD 03/18/2022   Essential hypertension 03/18/2022   Left rotator cuff tear 10/08/2020    PCP: Olivia Larsen MD  REFERRING PROVIDER: Ardean Larsen MD  REFERRING DIAG: LEFT UE Lymphedema  THERAPY DIAG:  Lymphedema  ONSET DATE: March 2024  Rationale for Evaluation and Treatment: Rehabilitation  SUBJECTIVE:                                                                                                                                                                                           SUBJECTIVE STATEMENT:  I have the flexi touch at home and I have been using it about 2x's per  week. It doesn't seem to help my hand   EVAL 07/28/22 Pt noticed swelling in the back of her hand and fingers in Late March and it has gotten worse. I had swelling in 2021 with chemo and got a blood clot in port. She got swelling in her left arm and hand. She was placed on Lovenox and once the clot cleared everything went  back to normal. She has no pain in the arm, but when the hand swells she can get pain. Tying to sleep with hand elevated. Pt has a Flexi touch and she used it in May,then boyfriend had an accident and she has had to help [name] so she hasn't had time to use it. It did help some.  PERTINENT HISTORY:   Left breast cancer IDC stage 2 with lumpectomy and bilateral reduction and lift and 0/2 nodes removed for Triple Negative Breast Cancer on 10/14/19 in [place]. Radiation and chemotherapy completed. 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   PAIN:  Are you having pain? No  PRECAUTIONS: Left UE lymphedema, Known Torn left RTC  WEIGHT BEARING RESTRICTIONS: No  FALLS:  Has patient fallen in last 6 months? No  LIVING ENVIRONMENT: Lives with: boyfriend and her mom Lives in: House/apartment Stairs: Yes; External: 5 steps; can reach both Has following equipment at home: Lymphedema pump?  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 Did not want to address today  A/PROM RIGHT   eval   Shoulder extension   Shoulder  flexion   Shoulder abduction   Shoulder internal rotation   Shoulder external rotation     (Blank rows = not tested)  A/PROM LEFT   eval  Shoulder extension   Shoulder flexion   Shoulder abduction   Shoulder internal rotation   Shoulder external rotation     (Blank rows = not tested)  CERVICAL AROM: All within functional limits:   UPPER EXTREMITY STRENGTH:   LYMPHEDEMA ASSESSMENTS:   SURGERY TYPE/DATE: 10/14/2019 Left Lumpectomy with SLNB and bilateral reduction  NUMBER OF LYMPH NODES REMOVED: 0/2  CHEMOTHERAPY: YES  RADIATION:YES  HORMONE TREATMENT: No    LYMPHEDEMA ASSESSMENTS:   LANDMARK RIGHT  eval  At axilla  46.9  15 cm proximal to olecranon process 42.8  10 cm proximal to olecranon process 41.8  Olecranon process 32.9  15 cm proximal to ulnar styloid process 31.4  10 cm proximal to ulnar styloid process 27.7  Just proximal to ulnar styloid process 19.8  Across hand at thumb web space 21.7  At base of 2nd digit 6.5  (Blank rows = not tested)  LANDMARK LEFT  eval LEFT 08/14/22 LEFT 08/25/22   At axilla  45.7    15 cm proximal to olecranon process 41.7 41.4   10 cm proximal to olecranon process 40.3 39.5   Olecranon process 33.1 32   15 cm proximal to ulnar styloid process 31.3 30   10  cm proximal to ulnar styloid process 27.7 26.8 27.0  Just proximal to ulnar styloid process 22.0 20.5 20.5  Across hand at thumb web space 23.4 22.5 21.3  At base of 2nd digit 7.2 7 6.6  (Blank rows = not tested)    FUNCTIONAL TESTS:    Lymphedema life Impact scale: 54%   TODAY'S TREATMENT:  DATE:   09/05/2022 (Addendum) Olivia [name], PT Pt was measured for compression garments on 08/29/2022. She requires custom garments due to the circumference of her arm being outside the range of a [employer] . She requires a  Mediven 550, 23-32 mm/hg flat knit sleeve/glove for better containment of her swelling. An elbow flexure zone is needed due to her job requirements and a wide silicone band to prevent rolling and assist in keeping sleeve up.   08/25/2022 Wraps removed and hand looks good. Remeasured hand and forearm Showed pt circaid night and discussed option of purchasing if covered by insurance Pts daughter Olivia [name] instructed in MLD In supine Therapist demonstrated and Olivia [name] then performed: Short neck, 5 diaphragmatic breaths, R axillary nodes and establishment of interaxillary pathway, L inguinal nodes and establishment of axilloinguinal pathway, then L UE working proximal to distal, moving fluid from upper inner arm outwards, and doing both sides of forearm moving fluid towards pathways then left hand  retracing all steps and ending with LN's Pt to call [employer] to see if her Flexi needs to be adjusted to do better with her hand. Pt to come Friday to be measured     08/20/2022 Pt arrived without bandages on. Left hand improved but more swollen than just after she took off wrap per pt. Reviewed left UE MLD in sitting with therapist demonstrating and pt performing. short neck, right axillary nodes, left inguinal nodes, 5 breaths, anterior inter-axillary anastamoses, left axillo-inguinal anastamoses. Left upper extremity from upper outer arm, inner to outer arm,outer arm to shoulder and retracing pathways, then both sides of forearm retracing all steps, and ending with dorsal hand to lateral shoulder redirecting along pathways.She started experiencing significant pain in the right shoulder so therapist completed  all of left Left UE MLD in supine so pt could rest arm. She performed all LN activation, pathways and outer arm.  Compression bandaging as follows: Elastomull to digits 1-5, TG soft from hand to forearm, 1/2 grey foam at dorsum of hand, 1 6 cm bandage at hand, 1 8 cm bandage from wrist to  elbow   08/14/22 Remeasured circumferences. Pt demonstrates a reduction in circumferences. Began manual lymph drainage in supine as follows: short neck, right axillary nodes, left inguinal nodes, superficial and deep abdominals; anterior inter-axillary anastamoses, left axillo-inguinal anastamoses. Left upper extremity from fingers and dorsal hand to lateral shoulder redirecting along pathways. Instructing pt throughout and having pt return demonstrate correct skin stretch technique.  Compression bandaging as follows: Elastomull to digits 1-5, TG soft from hand to forearm, 1/2 grey foam at dorsum of hand, 1 6 cm bandage at hand, 1 8 cm bandage from wrist to elbow  08/11/22 Pts husband instructed in compression bandaging after watching therapist demonstrate each technique; TG soft applied to arm. Elastomull applied to fingers 1-5 and practiced by husband 2 times with pt  and PT giving VC's to help.  Half inch foam placed over elastomull on dorsum of hand and TG soft pulled down over it. 6 cm hand wrap applied by therapist to demonstrate and reapplied by husband x 1 with occasional VC's. Therapist then applied 8 cm wrap wrist to elbow with TG soft pulled over it. Printed video instructions for caregiver and self wrapping. Pt performed check of capillary refill and was advised to loosen wraps with wrist ROM and circles. Advised to re-apply a new wrap each day.   07/28/22 Educated pt in POC and wrapped pt so she could see what  it would be like; used med TG soft, mollelast to fingers 1-5, 6 cm artiflex wrist to axilla, 6 cm hand wrap, and 1 12 cm wrap wrist to axilla with X at elbow. Pt mainly swollen wrist into hand and fingers. May be able to bandage to elbow? Pt to remove if having discomfort, pain, numbness or if it slides. May remove tonight or tomorrow prn. Advised to bring caregiver to learn wrapping.    PATIENT EDUCATION:  Education details: POC, CDT, compression garments Person educated:  Patient Education method: Medical illustrator Education comprehension: verbalized understanding, needs further education  HOME EXERCISE PROGRAM: NA  ASSESSMENT:  CLINICAL IMPRESSION: Measured pt and checked bandages; Done well overall but needs 1 more layer of wrap at proximal finger to decrease swelling. Gave pt names for finger wraps so she can order. Instructed pts daughter in MLD but ran out of time so went quickly through the hand and retracing steps and ending with LN's. She did very well with light pressure and stretch, Pt to come Friday to be measured. Pt. To contact Flexi touch for adjustment prn.  OBJECTIVE IMPAIRMENTS: decreased knowledge of condition, increased edema, and impaired UE functional use.   ACTIVITY LIMITATIONS: lifting and reach over head  PARTICIPATION LIMITATIONS:  participates in most but with increased swelling  PERSONAL FACTORS: 1-2 comorbidities: Left breast Cancer s/p radiation and chemo  are also affecting patient's functional outcome.   REHAB POTENTIAL: Good  CLINICAL DECISION MAKING: Stable/uncomplicated  EVALUATION COMPLEXITY: Low  GOALS: Goals reviewed with patient? Yes  SHORT TERM GOALS=LONG TERM GOALS: Target date: 08/25/2022  Pt /caregiver will be independent in compression bandaging to left hand/arm Baseline:no knowledge Goal status: INITIAL  2.  Pt will have decreased finger swelling by atleast .4 cm Baseline: 7.2 Goal status: INITIAL  3.  Pt will have decreased wrist swelling by 1-2 cm Baseline: 22 cm Goal status: MET 08/14/2022 4.  Pt will have decreased hand swelling by atleast 1 cm Baseline: 23.4 Goal status: INITIAL  5.  Pt will be fit for appropriate compression garments for daytime use Baseline:  Goal status: INITIAL  6.  Pt will be independent with self MLD to reduce swelling Baseline:  Goal status: INITIAL  PLAN:  PT FREQUENCY: 2x/week  PT DURATION: 4 weeks  PLANNED INTERVENTIONS: Therapeutic  exercises, Patient/Family education, Self Care, Orthotic/Fit training, Manual lymph drainage, Compression bandaging, Manual therapy, and Re-evaluation  PLAN FOR NEXT SESSION: Next visit is last per Pt. Due to work not allowing any others.Measure ,review pt/caregiver  compression bandaging,(mainly swollen wrist to hand ? Wrap to elbow vs upper arm), MLD, fit for garments as soon as ready. (Pt types all day and is having to see if she can get time off of work.) pt can only have 45 min appts from work  [employer], PT 08/25/2022, 2:58 PM

## 2022-08-27 ENCOUNTER — Encounter: Payer: Self-pay | Admitting: Radiology

## 2022-08-27 ENCOUNTER — Ambulatory Visit: Payer: Managed Care, Other (non HMO)

## 2022-08-27 DIAGNOSIS — M25511 Pain in right shoulder: Secondary | ICD-10-CM | POA: Diagnosis not present

## 2022-08-27 DIAGNOSIS — G8929 Other chronic pain: Secondary | ICD-10-CM

## 2022-09-09 NOTE — Progress Notes (Deleted)
    Aleen [facility]ells [facility] [address] [place] [address] Phone: [phone]   Assessment and Plan:     There are no diagnoses linked to this encounter.  ***   Pertinent previous records reviewed include ***   Follow Up: ***     Subjective:   I,  , am serving as a Neurosurgeon for Doctor [name]   Chief Complaint: thoracic back pain    HPI:    06/18/2022 Patient is a 52 year old female complaining of thoracic back pain. Patient states left sided mid thoracic back pain that has been ongoing for the past month . Reports her pain got much worse starting 05/22/2022 and she developed a dry intermittent cough. She is unsure what triggered her back pain, she does remember her after her shower trying to bend over and dry her legs and after that she noticed the back pain. She had tried 1 muscle relaxer without relief, has tried warm compress, massage, ice and multiple other at home remedies without relief. She denies any previous back injuries, denies any falls or trauma to her back. She denies any numbness, tingling or incontinence. Musculoskeletal tenderness to palpation of the area. Reports she is unable to take deep breaths due to the pain, has been unable to sleep on her left side due to the pain. She was given naproxen and tht has helped . Right shoulder pain that started around the same time and that has gotten worse since then , pain when she lays on her side that pain radiates into her neck , does endorse some numbness and tingling that goes down to her forearm     07/23/2022 Patient states that thoracic has been doing well but now the pain has shifted to her shoulder    08/20/2022 Patient states that she still gets some flare ups but much better than where we started, CSI didn't help much    09/10/2022 Patient states    Relevant Historical Information: hx of breast cancer     Additional pertinent review of systems  negative.   Current Outpatient Medications:    furosemide (LASIX) 20 MG tablet, Take 20 mg by mouth daily., Disp: , Rfl:    losartan-hydrochlorothiazide (HYZAAR) 100-25 MG tablet, Take 1 tablet by mouth daily., Disp: , Rfl:    meloxicam (MOBIC) 15 MG tablet, Take 1 tablet (15 mg total) by mouth daily., Disp: 30 tablet, Rfl: 0   methocarbamol (ROBAXIN) 500 MG tablet, Take 1 tablet (500 mg total) by mouth 2 (two) times daily., Disp: 20 tablet, Rfl: 0   tiZANidine (ZANAFLEX) 4 MG tablet, Take 4 mg by mouth at bedtime as needed., Disp: , Rfl:    Objective:     There were no vitals filed for this visit.    There is no height or weight on file to calculate BMI.    Physical Exam:    ***   Electronically signed by:  Aleen [facility]ells [facility] 7:22 AM 09/09/22

## 2022-09-10 ENCOUNTER — Ambulatory Visit: Payer: Managed Care, Other (non HMO) | Admitting: Sports Medicine

## 2022-09-11 ENCOUNTER — Ambulatory Visit: Payer: Managed Care, Other (non HMO) | Admitting: Physical Therapy

## 2022-10-06 NOTE — Therapy (Addendum)
OUTPATIENT PHYSICAL THERAPY SHOULDER EVALUATION/DC SUMMARY   Patient Name: Olivia [name] MRN: [id number] DOB:Jan 17, 1971, 52 y.o., female Today's Date: 12/18/2022  END OF SESSION:    Past Medical History:  Diagnosis Date   Breast cancer (HCC)    left   Essential hypertension 03/18/2022   Family history of breast cancer    Family history of breast cancer    Family history of colon cancer    Family history of early CAD 03/18/2022   Family history of stomach cancer    Lymphedema    Neuropathy    LE   Personal history of chemotherapy    Personal history of radiation therapy    Torn rotator cuff    left   Past Surgical History:  Procedure Laterality Date   ABDOMINAL HYSTERECTOMY     BREAST LUMPECTOMY Left 09/2019   LYMPHADENECTOMY     REDUCTION MAMMAPLASTY Bilateral 09/2019   TUBAL LIGATION     Patient Active Problem List   Diagnosis Date Noted   Genetic testing 06/30/2022   Family history of breast cancer 06/23/2022   Family history of colon cancer 06/23/2022   Family history of stomach cancer 06/23/2022   Personal history of breast cancer 06/23/2022   Family history of early CAD 03/18/2022   Essential hypertension 03/18/2022   Left rotator cuff tear 10/08/2020    PCP: Ollen Bowl, MD   REFERRING PROVIDER: [name], DO  REFERRING DIAG: [id number] (ICD-10-CM) - Chronic right shoulder pain  THERAPY DIAG:  Chronic right shoulder pain - Plan: PT plan of care cert/re-cert  Muscle weakness (generalized) - Plan: PT plan of care cert/re-cert  Rationale for Evaluation and Treatment: Rehabilitation  ONSET DATE: chronic  SUBJECTIVE:                                                                                                                                                                                      SUBJECTIVE STATEMENT: R shoulder pain ongoing since April, denies radicular symptoms. Hand dominance: Right  PERTINENT HISTORY: 1.  Chronic right shoulder pain - Chronic with exacerbation, subsequent visit - Continued right shoulder pain with overall minimal to no change after subacromial CSI previous office visit on 07/23/2022.  Unclear etiology of patient's ongoing right shoulder pain though differential includes impingement syndrome, intra-articular pathology, AC joint pathology - Continue HEP and start physical therapy.  Referral sent - May continue to use meloxicam as needed for severe breakthrough pain.  Recommend not using NSAIDs more frequently than 1-2 times per week for long-term pain relief - Recommend using Tylenol for day-to-day pain relief   Pertinent previous records reviewed include none   Follow Up: 3  weeks for reevaluation.  Would review x-ray at that time.  If no improvement or worsening of symptoms, could consider intra-articular versus AC joint CSI versus advanced imaging  PAIN:  Are you having pain? Yes: NPRS scale: 10/10 Pain location: R shoulder Pain description: ache Aggravating factors: lifting and sleep positions Relieving factors: undetermined  PRECAUTIONS: None  RED FLAGS: None   WEIGHT BEARING RESTRICTIONS: No  FALLS:  Has patient fallen in last 6 months? No  OCCUPATION: Works from home   PLOF: Independent  PATIENT GOALS:To reduce and manage my shoulder pain  NEXT MD VISIT: TBD  OBJECTIVE:   DIAGNOSTIC FINDINGS:  Narrative & Impression  CLINICAL DATA:  Pain.   EXAM: RIGHT SHOULDER - 3 VIEW   COMPARISON:  None Available.   FINDINGS: There is no evidence of fracture or dislocation. There is degenerative change of the [facility] joint with osteophyte formation. No osteolytic or osteoblastic changes.   IMPRESSION: Acromioclavicular degenerative changes. No acute osseous abnormalities.     Electronically Signed   By: Layla Maw M.D.   On: 09/01/2022 20:18    PATIENT SURVEYS:  FOTO 46(65 predicted)  POSTURE: Depressed R soulder  UPPER EXTREMITY ROM: L not  assessed due to mastectomy  A/PROM Right eval Left eval  Shoulder flexion 135/150   Shoulder extension 45   Shoulder abduction 120/150   Shoulder adduction    Shoulder internal rotation WFL/WNL   Shoulder external rotation WFL/WNL   Elbow flexion    Elbow extension    Wrist flexion    Wrist extension    Wrist ulnar deviation    Wrist radial deviation    Wrist pronation    Wrist supination    (Blank rows = not tested)  UPPER EXTREMITY MMT:  MMT Right eval Left eval  Shoulder flexion 4-   Shoulder extension 4-   Shoulder abduction 4-   Shoulder adduction    Shoulder internal rotation 4   Shoulder external rotation 4   Middle trapezius 4-   Lower trapezius 4-   Elbow flexion 4   Elbow extension 4   Wrist flexion    Wrist extension    Wrist ulnar deviation    Wrist radial deviation    Wrist pronation    Wrist supination    Grip strength (lbs)    (Blank rows = not tested)  SHOULDER SPECIAL TESTS: Impingement tests: Neer impingement test: negative and Hawkins/Kennedy impingement test: positive   Rotator cuff assessment: Drop arm test: negative, Empty can test: negative, and Full can test: negative Biceps assessment: Yergason's test: negative and Speed's test: negative   PALPATION:  TTP R pec minor   TODAY'S TREATMENT:                                                                                                                                         DATE: 10/08/22 Eval and HEP  PATIENT EDUCATION: Education details: Discussed eval findings, rehab rationale and POC and patient is in agreement  Person educated: Patient Education method: Explanation Education comprehension: verbalized understanding and needs further education  HOME EXERCISE PROGRAM: Access Code: [email or web address] URL: [email or web address] Date: 10/08/2022 Prepared by: Gustavus Bryant  Exercises - Shoulder External Rotation and Scapular Retraction with Resistance  - 3 x daily - 5 x  weekly - 1 sets - 15 reps - Standing Shoulder Horizontal Abduction with Resistance  - 3 x daily - 5 x weekly - 1 sets - 15 reps - Seated Shoulder Shrugs  - 3 x daily - 5 x weekly - 1 sets - 10 reps - Seated Scapular Retraction  - 3 x daily - 5 x weekly - 1 sets - 10 reps  ASSESSMENT:  CLINICAL IMPRESSION: Patient is a 53 y.o. female who was seen today for physical therapy evaluation and treatment for R shoulder pain ongoing from April 2024. Patient demonstrates decreased AROM due to pain, AC joint palpation unremarkable, impingement signs inconclusive.  TTP noted at R pec minor.  Mild strength deficits noted in R RC and shoulder girdle.  Patient is a good candidate for OPPT to resolve TTP as well as restore shoulder girdle strength.  OBJECTIVE IMPAIRMENTS: decreased knowledge of condition, decreased mobility, decreased ROM, decreased strength, postural dysfunction, obesity, and pain.   ACTIVITY LIMITATIONS: carrying, lifting, sleeping, and reach over head  PERSONAL FACTORS: Age, Fitness, and Time since onset of injury/illness/exacerbation are also affecting patient's functional outcome.   REHAB POTENTIAL: Good  CLINICAL DECISION MAKING: Evolving/moderate complexity  EVALUATION COMPLEXITY: Low   GOALS: Goals reviewed with patient? No  SHORT TERM GOALS: Target date: 10/29/2022    Patient to demonstrate independence in HEP  Baseline: [id number] Goal status: INITIAL  2.  Decrease worst pain to 6/10 Baseline: 4/10 Goal status: INITIAL   LONG TERM GOALS: Target date: 11/19/2022    Increase AROM R shoulder to 160d flexion and abduction Baseline:  A/PROM Right eval Left eval  Shoulder flexion 135/150   Shoulder extension 45   Shoulder abduction 120/150   Shoulder adduction    Shoulder internal rotation WFL/WNL   Shoulder external rotation WFL/WNL    Goal status: INITIAL  2.  Increase R shoulder girdle strength to 4/5 in deficit areas Baseline:  MMT Right eval Left eval   Shoulder flexion 4-   Shoulder extension 4-   Shoulder abduction 4-   Shoulder adduction    Shoulder internal rotation 4   Shoulder external rotation 4   Middle trapezius 4-   Lower trapezius 4-   Elbow flexion 4   Elbow extension 4    Goal status: INITIAL  3.  Increase FOTO score to 65 Baseline: 46 Goal status: INITIAL  4.  Decrease pain to 4/10 Baseline: 10/10 Goal status: INITIAL    PLAN:  PT FREQUENCY: 1-2x/week  PT DURATION: 6 weeks  PLANNED INTERVENTIONS: Therapeutic exercises, Therapeutic activity, Neuromuscular re-education, Balance training, Gait training, Patient/Family education, Self Care, Joint mobilization, Stair training, DME instructions, Dry Needling, Electrical stimulation, Spinal mobilization, Cryotherapy, Moist heat, Manual therapy, and Re-evaluation  PLAN FOR NEXT SESSION: HEP review and update, manual techniques as appropriate, aerobic tasks, ROM and flexibility activities, strengthening and PREs, TPDN, gait and balance training as needed     Hildred Laser, PT 12/18/2022, 3:53 PM

## 2022-10-08 ENCOUNTER — Ambulatory Visit: Payer: Managed Care, Other (non HMO) | Attending: Internal Medicine

## 2022-10-08 DIAGNOSIS — G8929 Other chronic pain: Secondary | ICD-10-CM | POA: Insufficient documentation

## 2022-10-08 DIAGNOSIS — M25511 Pain in right shoulder: Secondary | ICD-10-CM | POA: Insufficient documentation

## 2022-10-08 DIAGNOSIS — M6281 Muscle weakness (generalized): Secondary | ICD-10-CM | POA: Insufficient documentation

## 2022-11-03 ENCOUNTER — Ambulatory Visit
Admission: RE | Admit: 2022-11-03 | Discharge: 2022-11-03 | Disposition: A | Discharge status: Home / Self Care | Payer: Managed Care, Other (non HMO) | Source: Ambulatory Visit | Attending: Hematology and Oncology

## 2022-11-03 DIAGNOSIS — Z853 Personal history of malignant neoplasm of breast: Secondary | ICD-10-CM

## 2022-11-03 HISTORY — DX: Personal history of antineoplastic chemotherapy: Z92.21

## 2022-11-03 HISTORY — DX: Personal history of irradiation: Z92.3

## 2022-11-17 ENCOUNTER — Telehealth: Payer: Self-pay | Admitting: Cardiovascular Disease

## 2022-11-17 NOTE — Telephone Encounter (Addendum)
Discussed with Dr Duke Salvia and feels patient should be seen Scheduled patient for tomorrow with Josefina Do PA for office visit and EKG  Patient aware at the [facility] location

## 2022-11-17 NOTE — Telephone Encounter (Signed)
Pt c/o of Chest Pain: STAT if CP now or developed within 24 hours  1. Are you having CP right now? No   2. Are you experiencing any other symptoms (ex. SOB, nausea, vomiting, sweating)? Coughing   3. How long have you been experiencing CP? 3 days   4. Is your CP continuous or coming and going? Coming and going   5. Have you taken Nitroglycerin? No.  ?

## 2022-11-17 NOTE — Progress Notes (Unsigned)
   Cardiology Office Note:    Date:  11/17/2022   ID:  Olivia [name], DOB [birth year], MRN [id number]  PCP:  Ollen Bowl, MD  Cardiologist:  Chilton Si, MD  Electrophysiologist:  None   Referring MD: Ollen Bowl, MD   Chief Complaint: chest pain  History of Present Illness:    Olivia [name] is a 52 y.o. female with a history of hypertension, lymphedema, and breast cancer s/p lumpectomy/ chemo/ radiation who is followed by Dr. Duke Salvia and presents today for further evaluation of chest pain.   Patient was referred to Dr. Duke Salvia in 02/2022 to get established with Cardiology given family history of early CAD. She was doing well from a cardiac standpoint at that time with not angina symptoms. PCP had previously recommended a coronary calcium score but patient wanted to see Cardiology first. Dr. Duke Salvia agreed with getting a coronary calcium score. It looks like this has been ordered by PCP but not completed yet.  She called our office on 11/17/2022 with reports of some chest tightness. Therefore, this visit was arranged for further evaluation. ***  Chest Tightness ***  Hypertension BP well controlled. *** - Continue Losartan-HCTZ 100-25mg  daily.   Lymphedema Patient has a history of left upper extremity lymphedema after lumpectomy for breast cancer.  - Continue Lasix 20mg  daily.   EKGs/Labs/Other Studies Reviewed:    The following studies were reviewed: N/A  EKG:  EKG ordered today. EKG personally reviewed and demonstrates ***.  Recent Labs: No results found for requested labs within last 365 days.  Recent Lipid Panel No results found for: "CHOL", "TRIG", "HDL", "CHOLHDL", "VLDL", "LDLCALC", "LDLDIRECT"  Physical Exam:    Vital Signs: There were no vitals taken for this visit.    Wt Readings from Last 3 Encounters:  08/20/22 297 lb (134.7 kg)  07/23/22 (!) 305 lb (138.3 kg)  06/18/22 (!) 302 lb (137 kg)     General: 52 y.o. female in no acute  distress. HEENT: Normocephalic and atraumatic. Sclera clear. EOMs intact. Neck: Supple. No carotid bruits. No JVD. Heart: *** RRR. Distinct S1 and S2. No murmurs, gallops, or rubs. Radial and distal pedal pulses 2+ and equal bilaterally. Lungs: No increased work of breathing. Clear to ausculation bilaterally. No wheezes, rhonchi, or rales.  Abdomen: Soft, non-distended, and non-tender to palpation. Bowel sounds present in all 4 quadrants.  MSK: Normal strength and tone for age. *** Extremities: No lower extremity edema.    Skin: Warm and dry. Neuro: Alert and oriented x3. No focal deficits. Psych: Normal affect. Responds appropriately.   Assessment:    No diagnosis found.  Plan:     Disposition: Follow up in ***   Medication Adjustments/Labs and Tests Ordered: Current medicines are reviewed at length with the patient today.  Concerns regarding medicines are outlined above.  No orders of the defined types were placed in this encounter.  No orders of the defined types were placed in this encounter.   There are no Patient Instructions on file for this visit.   Signed, Corrin Parker, PA-C  11/17/2022 3:26 PM    [facility]

## 2022-11-17 NOTE — Telephone Encounter (Signed)
Returned call to patient,   Patient states she is not really having pain, it is more like a tightening, happens most when she is up and moving around or when she is anxious about something. She states it has been going on for about 3 days, she states this did happen last year when her allergies were really bad. She states pain does get better when she takes the medications. She does have a cough, but does not believe that her cough is related. The only thing that makes it better. She thinks it feels like when people describe their heart fluttering.

## 2022-11-18 ENCOUNTER — Encounter: Payer: Self-pay | Admitting: Student

## 2022-11-18 ENCOUNTER — Ambulatory Visit: Payer: Managed Care, Other (non HMO) | Attending: Student | Admitting: Student

## 2022-11-18 VITALS — BP 98/66 | HR 74 | Ht 68.0 in | Wt 304.0 lb

## 2022-11-18 DIAGNOSIS — I10 Essential (primary) hypertension: Secondary | ICD-10-CM

## 2022-11-18 DIAGNOSIS — M79604 Pain in right leg: Secondary | ICD-10-CM

## 2022-11-18 DIAGNOSIS — Z8249 Family history of ischemic heart disease and other diseases of the circulatory system: Secondary | ICD-10-CM

## 2022-11-18 DIAGNOSIS — R072 Precordial pain: Secondary | ICD-10-CM

## 2022-11-18 DIAGNOSIS — M79605 Pain in left leg: Secondary | ICD-10-CM

## 2022-11-18 DIAGNOSIS — I890 Lymphedema, not elsewhere classified: Secondary | ICD-10-CM | POA: Diagnosis not present

## 2022-11-18 MED ORDER — IVABRADINE HCL 5 MG PO TABS: 10.0000 mg | ORAL_TABLET | Freq: Once | ORAL | Status: DC

## 2022-11-18 MED ORDER — IVABRADINE HCL 5 MG PO TABS: 10.0000 mg | ORAL_TABLET | Freq: Once | ORAL | 0 refills | Status: AC

## 2022-11-18 NOTE — Patient Instructions (Signed)
Medication Instructions:  Your physician recommends that you continue on your current medications as directed. Please refer to the Current Medication list given to you today.  *If you need a refill on your cardiac medications before your next appointment, please call your pharmacy*   Lab Work: Your physician recommends that you have labs drawn today: BMET  If you have labs (blood work) drawn today and your tests are completely normal, you will receive your results only by: MyChart Message (if you have MyChart) OR A paper copy in the mail If you have any lab test that is abnormal or we need to change your treatment, we will call you to review the results.   Testing/Procedures: Your physician has requested that you have an ankle brachial index (ABI). During this test an ultrasound and blood pressure cuff are used to evaluate the arteries that supply the arms and legs with blood. Allow thirty minutes for this exam. There are no restrictions or special instructions. This will take place at [address] [facility], [address].      Follow-Up: At [facility], you and your health needs are our priority.  As part of our continuing mission to provide you with exceptional heart care, we have created designated Provider Care Teams.  These Care Teams include your primary Cardiologist (physician) and Advanced Practice Providers (APPs -  Physician Assistants and Nurse Practitioners) who all work together to provide you with the care you need, when you need it.  We recommend signing up for the patient portal called "MyChart".  Sign up information is provided on this After Visit Summary.  MyChart is used to connect with patients for Virtual Visits (Telemedicine).  Patients are able to view lab/test results, encounter notes, upcoming appointments, etc.  Non-urgent messages can be sent to your provider as well.   To learn more about what you can do with MyChart, go to [email or web address].    Your  next appointment:   Monday, February 17th @ 9:00am  Provider:   Chilton Si, MD    Other Instructions   Your cardiac CT will be scheduled at the below location:   [facility] [address] [place], [place] [address] [phone]   If scheduled at [facility], please arrive at the [facility] Entrance (Entrance C2) of [facility] 30 minutes prior to test start time. You can use the FREE valet parking offered at entrance C (encouraged to control the heart rate for the test)  Proceed to the [facility] Radiology Department (first floor) to check-in and test prep.  All radiology patients and guests should use entrance C2 at [facility], accessed from [facility], even though the hospital's physical address listed is [address].     Please follow these instructions carefully (unless otherwise directed):   On the Night Before the Test: Be sure to Drink plenty of water. Do not consume any caffeinated/decaffeinated beverages or chocolate 12 hours prior to your test. Do not take any antihistamines 12 hours prior to your test.   On the Day of the Test: Drink plenty of water until 1 hour prior to the test. Do not eat any food 1 hour prior to test. You may take your regular medications prior to the test.  Ivabradine 10mg  two hours prior to procedure. If you take Furosemide/Hydrochlorothiazide/Spironolactone, please HOLD on the morning of the test. **Hold Hyxaar on the day of your procedure** FEMALES- please wear underwire-free bra if available,  avoid dresses & tight clothing      After the Test: Drink plenty of water. After receiving IV contrast, you may experience a mild flushed feeling. This is normal. On occasion, you may experience a mild rash up to 24 hours after the test. This is not dangerous. If this occurs, you can take Benadryl 25 mg and increase your fluid intake. If you experience trouble  breathing, this can be serious. If it is severe call 911 IMMEDIATELY. If it is mild, please call our office. If you take any of these medications: Glipizide/Metformin, Avandament, Glucavance, please do not take 48 hours after completing test unless otherwise instructed.  We will call to schedule your test 2-4 weeks out understanding that some insurance companies will need an authorization prior to the service being performed.   For more information and frequently asked questions, please visit our website : [email or web address]  For non-scheduling related questions, please contact the cardiac imaging nurse navigator should you have any questions/concerns: Cardiac Imaging Nurse Navigators Direct Office Dial: [phone]   For scheduling needs, including cancellations and rescheduling, please call [place], [phone].

## 2022-11-21 ENCOUNTER — Other Ambulatory Visit: Payer: Self-pay | Admitting: Internal Medicine

## 2022-11-21 ENCOUNTER — Other Ambulatory Visit: Payer: Self-pay | Admitting: Student

## 2022-11-21 ENCOUNTER — Encounter: Payer: Self-pay | Admitting: Sports Medicine

## 2022-11-21 DIAGNOSIS — Z8249 Family history of ischemic heart disease and other diseases of the circulatory system: Secondary | ICD-10-CM

## 2022-11-21 DIAGNOSIS — I10 Essential (primary) hypertension: Secondary | ICD-10-CM

## 2022-11-21 DIAGNOSIS — M79604 Pain in right leg: Secondary | ICD-10-CM

## 2022-11-21 DIAGNOSIS — I890 Lymphedema, not elsewhere classified: Secondary | ICD-10-CM

## 2022-11-21 DIAGNOSIS — R072 Precordial pain: Secondary | ICD-10-CM

## 2022-11-24 ENCOUNTER — Other Ambulatory Visit: Payer: Self-pay | Admitting: Sports Medicine

## 2022-11-24 MED ORDER — METHOCARBAMOL 500 MG PO TABS: 501.0000 mg | ORAL_TABLET | Freq: Every day | ORAL | 0 refills | Status: AC | PRN

## 2022-11-24 NOTE — Telephone Encounter (Signed)
Refill placed

## 2022-11-24 NOTE — Progress Notes (Unsigned)
Robaxin refill placed

## 2022-11-28 ENCOUNTER — Ambulatory Visit (HOSPITAL_COMMUNITY)
Admission: RE | Admit: 2022-11-28 | Discharge: 2022-11-28 | Disposition: A | Discharge status: Home / Self Care | Payer: Managed Care, Other (non HMO) | Source: Ambulatory Visit | Attending: Cardiology | Admitting: Cardiology

## 2022-11-28 DIAGNOSIS — M79604 Pain in right leg: Secondary | ICD-10-CM | POA: Diagnosis present

## 2022-11-28 DIAGNOSIS — M79605 Pain in left leg: Secondary | ICD-10-CM | POA: Insufficient documentation

## 2022-12-01 LAB — VAS US ABI WITH/WO TBI
Left ABI: 1.16
Right ABI: 1.27

## 2023-01-27 ENCOUNTER — Encounter (HOSPITAL_COMMUNITY): Payer: Self-pay

## 2023-02-03 NOTE — Progress Notes (Deleted)
    Olivia [name] [facility] [address] [place] [address] Phone: [phone]   Assessment and Plan:     There are no diagnoses linked to this encounter.  ***   Pertinent previous records reviewed include ***    Follow Up: ***     Subjective:   I, Olivia [name], am serving as a Neurosurgeon for Doctor [name]   Chief Complaint: thoracic back pain    HPI:    06/18/2022 Patient is a 52 year old female complaining of thoracic back pain. Patient states left sided mid thoracic back pain that has been ongoing for the past month . Reports her pain got much worse starting 05/22/2022 and she developed a dry intermittent cough. She is unsure what triggered her back pain, she does remember her after her shower trying to bend over and dry her legs and after that she noticed the back pain. She had tried 1 muscle relaxer without relief, has tried warm compress, massage, ice and multiple other at home remedies without relief. She denies any previous back injuries, denies any falls or trauma to her back. She denies any numbness, tingling or incontinence. Musculoskeletal tenderness to palpation of the area. Reports she is unable to take deep breaths due to the pain, has been unable to sleep on her left side due to the pain. She was given naproxen and tht has helped . Right shoulder pain that started around the same time and that has gotten worse since then , pain when she lays on her side that pain radiates into her neck , does endorse some numbness and tingling that goes down to her forearm     07/23/2022 Patient states that thoracic has been doing well but now the pain has shifted to her shoulder    08/20/2022 Patient states that she still gets some flare ups but much better than where we started, CSI didn't help much    02/04/2023 Patient states   Relevant Historical Information: hx of breast cancer   Additional pertinent review of systems  negative.   Current Outpatient Medications:    losartan-hydrochlorothiazide (HYZAAR) 100-25 MG tablet, Take 1 tablet by mouth daily., Disp: , Rfl:    meloxicam (MOBIC) 15 MG tablet, Take 1 tablet (15 mg total) by mouth daily., Disp: 30 tablet, Rfl: 0   methocarbamol (ROBAXIN) 500 MG tablet, Take 1 tablet (500 mg total) by mouth 2 (two) times daily., Disp: 20 tablet, Rfl: 0   methocarbamol (ROBAXIN) 500 MG tablet, Take 1 tablet (501 mg total) by mouth daily as needed for muscle spasms., Disp: 30 tablet, Rfl: 0   tiZANidine (ZANAFLEX) 4 MG tablet, Take 4 mg by mouth at bedtime as needed., Disp: , Rfl:    Objective:     There were no vitals filed for this visit.    There is no height or weight on file to calculate BMI.    Physical Exam:    ***   Electronically signed by:  Olivia [name] [facility] 7:45 AM 02/03/23

## 2023-02-04 ENCOUNTER — Ambulatory Visit: Payer: Managed Care, Other (non HMO) | Admitting: Sports Medicine

## 2023-02-05 ENCOUNTER — Ambulatory Visit (INDEPENDENT_AMBULATORY_CARE_PROVIDER_SITE_OTHER): Payer: Managed Care, Other (non HMO) | Admitting: Sports Medicine

## 2023-02-05 VITALS — HR 78 | Ht 68.0 in | Wt 300.0 lb

## 2023-02-05 DIAGNOSIS — G8929 Other chronic pain: Secondary | ICD-10-CM | POA: Diagnosis not present

## 2023-02-05 DIAGNOSIS — M25571 Pain in right ankle and joints of right foot: Secondary | ICD-10-CM | POA: Diagnosis not present

## 2023-02-05 DIAGNOSIS — M25561 Pain in right knee: Secondary | ICD-10-CM | POA: Diagnosis not present

## 2023-02-05 MED ORDER — METHYLPREDNISOLONE 4 MG PO TBPK: ORAL_TABLET | ORAL | 0 refills | Status: DC

## 2023-02-05 NOTE — Patient Instructions (Addendum)
Prednisone dos pak  Knee HEP  As needed if no improvement 2-4 week follow up

## 2023-02-05 NOTE — Progress Notes (Signed)
Olivia [name] D.[facility] [address] [place] [address] Phone: [phone]   Assessment and Plan:     1. Chronic pain of right knee 2. Chronic pain of right ankle  -Chronic with exacerbation, initial visit - 3 months of right ankle and knee pain.  Most consistent with flare of lateral knee compartment from positioning with long driving distances, leading to compensation and strain of right lateral ankle - No red flag symptoms, so no imaging at today's visit - Patient states she was told by PCP to avoid NSAIDs due to kidney function.  I do not have records the patient's kidney function, though we will avoid NSAID use - Start prednisone Dosepak - Start HEP focusing on knee  15 additional minutes spent for educating Therapeutic Home Exercise Program.  This included exercises focusing on stretching, strengthening, with focus on eccentric aspects.   Long term goals include an improvement in range of motion, strength, endurance as well as avoiding reinjury. Patient's frequency would include in 1-2 times a day, 3-5 times a week for a duration of 6-12 weeks. Proper technique shown and discussed handout in great detail with ATC.  All questions were discussed and answered.    Pertinent previous records reviewed include none  Follow Up: As needed if no improvement in 2 to 3 weeks.  If no improvement or worsening of symptoms, would follow-up in clinic and obtain right knee and right ankle x-ray.  Could consider physical therapy versus CSI   Subjective:   I, Olivia [name], am serving as a Neurosurgeon for Doctor [name]  Chief Complaint: right ankle pain   HPI:  02/05/2023 Patient states right knee down to her ankle . Pain for about 2 months she did a lot of driving for the trip. Tylenol for the pain , lateral knee pain down. Tiger balm helps. No numbness or tingling. Constant pain 2-3/10. Pain is the worse at night when she is trying  to go to sleep. She is not able to sleep through the night. She will have to place a pillow between her knees to get relief.   Relevant Historical Information: Hypertension  Additional pertinent review of systems negative.   Current Outpatient Medications:    losartan-hydrochlorothiazide (HYZAAR) 100-25 MG tablet, Take 1 tablet by mouth daily., Disp: , Rfl:    meloxicam (MOBIC) 15 MG tablet, Take 1 tablet (15 mg total) by mouth daily., Disp: 30 tablet, Rfl: 0   methocarbamol (ROBAXIN) 500 MG tablet, Take 1 tablet (500 mg total) by mouth 2 (two) times daily., Disp: 20 tablet, Rfl: 0   methocarbamol (ROBAXIN) 500 MG tablet, Take 1 tablet (501 mg total) by mouth daily as needed for muscle spasms., Disp: 30 tablet, Rfl: 0   tiZANidine (ZANAFLEX) 4 MG tablet, Take 4 mg by mouth at bedtime as needed., Disp: , Rfl:    Objective:     Vitals:   02/05/23 1124  Pulse: 78  SpO2: 100%  Weight: 300 lb (136.1 kg)  Height: 5\' 8"  (1.727 m)      Body mass index is 45.61 kg/m.    Physical Exam:    General:  awake, alert oriented, no acute distress nontoxic Skin: no suspicious lesions or rashes Neuro:sensation intact and strength 5/5 with no deficits, no atrophy, normal muscle tone Psych: No signs of anxiety, depression or other mood disorder  Right knee: No swelling No deformity  Neg fluid wave, joint milking ROM Flex 110, Ext 0 TTP lateral femoral condyle NTTP over the quad tendon, medial fem condyle,  patella, plica, patella tendon, tibial tuberostiy, fibular head, posterior fossa, pes anserine bursa, gerdy's tubercle, medial jt line, lateral jt line Neg anterior and posterior drawer Neg lachman Neg sag sign Negative varus stress Negative valgus stress Negative McMurray Positive Thessaly  Gait normal    Electronically signed by:  Olivia [name] [facility] 11:44 AM 02/05/23

## 2023-02-06 ENCOUNTER — Ambulatory Visit: Payer: Managed Care, Other (non HMO) | Admitting: Sports Medicine

## 2023-04-06 ENCOUNTER — Ambulatory Visit (HOSPITAL_BASED_OUTPATIENT_CLINIC_OR_DEPARTMENT_OTHER): Payer: Managed Care, Other (non HMO) | Admitting: Cardiovascular Disease

## 2023-04-30 ENCOUNTER — Telehealth: Payer: Self-pay

## 2023-04-30 NOTE — Telephone Encounter (Signed)
 Left message on voicemail for patient about appointment on 05/01/23

## 2023-05-01 ENCOUNTER — Inpatient Hospital Stay: Payer: Managed Care, Other (non HMO) | Admitting: Hematology and Oncology

## 2023-05-15 NOTE — Progress Notes (Signed)
 Olivia [name] [facility] [address] [place] [address] Phone: [phone]   Assessment and Plan:     1. Chronic pain of right knee 2. Chronic pain of right ankle 3. Primary osteoarthritis of right knee -Chronic with exacerbation, subsequent visit - Patient presents with pain throughout right leg x 5 to 6 months.  I suspect primary pain generator is patient's right knee, likely due to flare of mild osteoarthritis seen on x-ray, that is leading to compensatory pain in right ankle and right hip - Continue HEP and start physical therapy targeting knee and ankle - Patient was told by PCP to not take NSAIDs due to kidney function.  I do not have access to patient's kidney function lab work, but we will avoid NSAIDs based on patient report - Patient did not tolerate prednisone Dosepak well due to GI discomfort.  Will avoid p.o. prednisone in the future - Patient elected for intra-articular right knee CSI.  Tolerated well per note below - X-ray obtained in clinic.  My interpretation: No acute fracture or dislocation.  Mild degenerative changes with mild decrease in joint space, medial/lateral compartment spurring, posterior patellar spurring  Procedure: Knee Joint Injection Side: Right Indication: Flare of osteoarthritis  Risks explained and consent was given verbally. The site was cleaned with alcohol prep. A needle was introduced with an anterio-lateral approach. Injection given using 2mL of 1% lidocaine without epinephrine and 1mL of kenalog 40mg /ml. This was well tolerated and resulted in symptomatic relief.  Needle was removed, hemostasis achieved, and post injection instructions were explained.   Pt was advised to call or return to clinic if these symptoms worsen or fail to improve as anticipated.    Pertinent previous records reviewed include none  Follow Up: 3 to 4 weeks for reevaluation.  Could discuss alternative injections versus areas of  remaining pain.  Could consider lumbar/hip origin of pain if no improvement   Subjective:   I, Olivia [name], am serving as a Neurosurgeon for Doctor [name]   Chief Complaint: right ankle pain    HPI:  02/05/2023 Patient states right knee down to her ankle . Pain for about 2 months she did a lot of driving for the trip. Tylenol for the pain , lateral knee pain down. Tiger balm helps. No numbness or tingling. Constant pain 2-3/10. Pain is the worse at night when she is trying to go to sleep. She is not able to sleep through the night. She will have to place a pillow between her knees to get relief.   05/18/2023 Patient states pain has been getting worse. Feels like leg is shutting down on her . She isnt able to sleep through the night. Pain starts at the hip and goes down the whole leg. Decreased ROM. When she lifts her leg to turnover she has pain relief but when she brings it back down the pain is back. Pain got really bad a week ago. Ice  helped a lot. Pain meds/muscle relaxer's dont help. Tramadol helps ease the pain, she is able to get a little sleep. Think she is allergic to steroid     Relevant Historical Information: Hypertension   Additional pertinent review of systems negative.   Current Outpatient Medications:    losartan-hydrochlorothiazide (HYZAAR) 100-25 MG tablet, Take 1 tablet by mouth daily., Disp: , Rfl:    meloxicam (MOBIC) 15 MG tablet, Take 1 tablet (15 mg total) by mouth daily., Disp: 30 tablet, Rfl: 0  methocarbamol (ROBAXIN) 500 MG tablet, Take 1 tablet (500 mg total) by mouth 2 (two) times daily., Disp: 20 tablet, Rfl: 0   methocarbamol (ROBAXIN) 500 MG tablet, Take 1 tablet (501 mg total) by mouth daily as needed for muscle spasms., Disp: 30 tablet, Rfl: 0   methylPREDNISolone (MEDROL DOSEPAK) 4 MG TBPK tablet, Take 6 tablets on day 1.  Take 5 tablets on day 2.  Take 4 tablets on day 3.  Take 3 tablets on day 4.  Take 2 tablets on day 5.  Take 1 tablet on day  6., Disp: 21 tablet, Rfl: 0   tiZANidine (ZANAFLEX) 4 MG tablet, Take 4 mg by mouth at bedtime as needed., Disp: , Rfl:    Objective:     Vitals:   05/18/23 1140  Pulse: 97  SpO2: 98%  Weight: 300 lb (136.1 kg)  Height: 5\' 8"  (1.727 m)      Body mass index is 45.61 kg/m.    Physical Exam:    General:  awake, alert oriented, no acute distress nontoxic Skin: no suspicious lesions or rashes Neuro:sensation intact and strength 5/5 with no deficits, no atrophy, normal muscle tone Psych: No signs of anxiety, depression or other mood disorder   Right knee: No swelling No deformity Neg fluid wave, joint milking ROM Flex 110, Ext 0 TTP lateral femoral condyle NTTP over the quad tendon, medial fem condyle,  patella, plica, patella tendon, tibial tuberostiy, fibular head, posterior fossa, pes anserine bursa, gerdy's tubercle, medial jt line, lateral jt line Neg anterior and posterior drawer Neg lachman Neg sag sign Negative varus stress Negative valgus stress Negative McMurray Positive Thessaly Negative straight leg raise bilaterally Right FABER and FADIR causes right knee pain, but no hip pain Gait normal       Electronically signed by:  Olivia [name] [facility] 12:06 PM 05/18/23

## 2023-05-18 ENCOUNTER — Ambulatory Visit: Admitting: Sports Medicine

## 2023-05-18 ENCOUNTER — Ambulatory Visit (INDEPENDENT_AMBULATORY_CARE_PROVIDER_SITE_OTHER)

## 2023-05-18 VITALS — HR 97 | Ht 68.0 in | Wt 300.0 lb

## 2023-05-18 DIAGNOSIS — M25561 Pain in right knee: Secondary | ICD-10-CM

## 2023-05-18 DIAGNOSIS — M1711 Unilateral primary osteoarthritis, right knee: Secondary | ICD-10-CM | POA: Diagnosis not present

## 2023-05-18 DIAGNOSIS — G8929 Other chronic pain: Secondary | ICD-10-CM | POA: Diagnosis not present

## 2023-05-18 DIAGNOSIS — M25571 Pain in right ankle and joints of right foot: Secondary | ICD-10-CM

## 2023-05-18 NOTE — Patient Instructions (Signed)
 PT referral  Recommend stationary bike, elliptical, and water aerobics 3-4 week follow up

## 2023-05-19 ENCOUNTER — Inpatient Hospital Stay: Attending: Hematology and Oncology | Admitting: Hematology and Oncology

## 2023-05-19 ENCOUNTER — Encounter: Payer: Self-pay | Admitting: Sports Medicine

## 2023-05-19 VITALS — BP 144/70 | HR 86 | Temp 97.9°F | Resp 16 | Wt 302.4 lb

## 2023-05-19 DIAGNOSIS — I890 Lymphedema, not elsewhere classified: Secondary | ICD-10-CM | POA: Diagnosis not present

## 2023-05-19 DIAGNOSIS — Z171 Estrogen receptor negative status [ER-]: Secondary | ICD-10-CM | POA: Insufficient documentation

## 2023-05-19 DIAGNOSIS — C50912 Malignant neoplasm of unspecified site of left female breast: Secondary | ICD-10-CM | POA: Insufficient documentation

## 2023-05-19 DIAGNOSIS — Z853 Personal history of malignant neoplasm of breast: Secondary | ICD-10-CM | POA: Diagnosis not present

## 2023-05-19 DIAGNOSIS — Z809 Family history of malignant neoplasm, unspecified: Secondary | ICD-10-CM | POA: Diagnosis not present

## 2023-05-19 DIAGNOSIS — G620 Drug-induced polyneuropathy: Secondary | ICD-10-CM | POA: Diagnosis not present

## 2023-05-19 DIAGNOSIS — Z803 Family history of malignant neoplasm of breast: Secondary | ICD-10-CM | POA: Diagnosis not present

## 2023-05-19 NOTE — Progress Notes (Signed)
 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  She then underwent adjuvant radiation and was followed up at [facility] by her oncologist.     Breast cancer post-treatment In year four post-diagnosis of breast cancer, initially diagnosed in 2021. She has undergone chemotherapy and radiation, resulting in neuropathy and lymphedema. No current evidence of cancer recurrence, but experiences pain and swelling in the left breast, likely due to dense scar tissue and lymphedema. Peau d'orange changes consistent with post-radiation effects. Prefers not to undergo MRD testing for cancer recurrence. - Prescribe compression bras for swelling and tenderness in the left breast. - Advise monitoring for persistent pain or swelling and to contact provider if symptoms continue. - Schedule a mammogram in September for routine follow-up.  Lymphedema Lymphedema in the left breast, likely secondary to breast cancer treatment, contributing to swelling and tenderness. - Prescribe compression bras to manage lymphedema.  Chemotherapy-induced peripheral neuropathy Experiences neuropathy due to chemotherapy. Opts not to take medication due to concerns about side effects affecting mental health, such as anxiety or depression, which she does not have.  Hypertension Managing hypertension with losartan and potassium supplementation. No new concerns or  changes.  Chronic left shoulder pain Tear in left shoulder managed with methocarbamol and tizanidine as needed for muscle relaxation, alternating to avoid simultaneous use.  Follow-up  - Schedule a follow-up appointment in one year if all is well. - Strict precautions given to call us sooner if left breast pain worsens.   Thank you for consulting [place] in the care of this patient.  Please do not hesitate to contact us with any additional questions or concerns  HISTORY OF PRESENTING ILLNESS:  Olivia [name] 53 y.o. female is here because of recent diagnosis of left breast cancer  This is a very pleasant 53 year old female patient with past medical history significant for left breast cancer diagnosed on a screening mammogram back in January 2021 which showed a left breast 3.1 x 2 cm hypoechoic mass and mildly enlarged axillary node.  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  Lymph node was thought to be benign.    She underwent chemotherapy with [facility] from April 14, 2019 to June 09, 2019 followed by [facility] from 06/23/2019 to 09/15/2019.  She was not able to tolerate CarboTaxol hence was changed to single agent Taxol starting cycle 2-day 8 which was once again stopped with 1 infusion left because of severe neuropathy pain.  She then underwent left lumpectomy and sentinel lymph node biopsy on October 14, 2019.  Final pathologic staging was pT0 N0 consistent with complete clinical response.  She then underwent radiation from December 05, 2019 through 11/22/202.  She was last seen by Dr. Otilio Carpen at [facility] on November 01, 2021.   She recently moved to [facility] and is establishing with [place].   Her last mammogram was November 01, 2021 with no evidence of malignancy, posttreatment changes  in the left breast and postreduction changes in the right breast.  FH of breast cancer in maternal aunt at 41, died from uterine cancer.  Maternal cousin had BC in her 73's, died from it. Another maternal aunt died from lung cancer at the age of 62, never smoker. She declined genetic testing in the past, but interested now.  She had 2 kids, one passed away in 2018/06/09, daughter lives in [place]. She breast fed both her kids.  Interval History  Olivia [name] is a 53 year old female who presents for follow-up regarding chemotherapy-induced neuropathy and recent knee spurs.  She has ongoing issues with neuropathy since undergoing chemotherapy. The neuropathy has not worsened and is not progressing. She opts not to take medication for it due to concerns about side effects, stating she is managing without it. No anxiety or depression reported.  She recently experienced knee spurs and consulted Dr. Jean Rosenthal for evaluation. She was prescribed prednisone, which she has completed, and is not currently taking it. She also uses methocarbamol and tizanidine as needed for muscle relaxation, particularly for a tear in her left shoulder.  She has a history of lymphedema in her left leg since the birth of her son, She also experiences lymphedema in her left breast, with recent pain and fluid accumulation, which she attributes to past radiation therapy. No acute concerns for recurrence were noted. She denies anxiety and depression.  Her medication regimen includes losartan for blood pressure and potassium supplements. She does not take meloxicam as it was ineffective for her.  MEDICAL HISTORY:  Past Medical History:  Diagnosis Date   Breast cancer (HCC)    left   Essential hypertension 03/18/2022   Family history of breast cancer    Family history of breast cancer    Family history of colon cancer    Family history of early CAD 03/18/2022   Family history of stomach cancer    Lymphedema    Neuropathy    LE   Personal history of chemotherapy    Personal history of radiation therapy    Torn rotator cuff    left    SURGICAL  HISTORY: Past Surgical History:  Procedure Laterality Date   ABDOMINAL HYSTERECTOMY     BREAST LUMPECTOMY Left 09/2019   LYMPHADENECTOMY     REDUCTION MAMMAPLASTY Bilateral 09/2019   TUBAL LIGATION      SOCIAL HISTORY: Social History   Socioeconomic History   Marital status: Legally Separated    Spouse name: Not on file   Number of children: 2   Years of education: Not on file   Highest education level: Some college, no degree  Occupational History    Comment: Engineering geologist  Tobacco Use   Smoking status: Never   Smokeless tobacco: Never  Vaping Use   Vaping status: Not on file  Substance and Sexual Activity   Alcohol use: Yes    Comment: social   Drug use: Yes    Types: Marijuana   Sexual activity: Not on file  Other Topics Concern   Not on file  Social History Narrative   Caffetine intake once a month a cup of coffee   Right handed   Social Drivers of Corporate investment banker Strain: Not on file  Food Insecurity: No Food Insecurity (03/18/2022)   Hunger Vital Sign    Worried About Running Out of Food in the Last Year: Never true    Ran Out of Food in the Last Year:  Never true  Transportation Needs: No Transportation Needs (03/18/2022)   PRAPARE - Administrator, Civil Service (Medical): No    Lack of Transportation (Non-Medical): No  Physical Activity: Inactive (03/18/2022)   Exercise Vital Sign    Days of Exercise per Week: 0 days    Minutes of Exercise per Session: 0 min  Stress: Not on file  Social Connections: Unknown (07/07/2022)   Received from [facility]   Social Network    Social Network: Not on file  Intimate Partner Violence: Unknown (07/07/2022)   Received from [facility], [facility]   HITS    Physically Hurt: Not on file    Insult or Talk Down To: Not on file    Threaten Physical Harm: Not on file    Scream or Curse: Not on file    FAMILY HISTORY: Family History  Problem Relation Age  of Onset   Hypertension Mother    CAD Mother    Diabetes Father    Hypertension Father    Colon cancer Father 38   Hypertension Sister    Breast cancer Maternal Aunt 63   Vaginal cancer Maternal Aunt 63   Breast cancer Maternal Aunt 71   Breast cancer Maternal Aunt 69   Stomach cancer Paternal Aunt 32   Cancer Maternal Grandmother        NOS   Breast cancer Cousin 35       d. 63; maternal first cousin   Leukemia Cousin 63       d. 35; maternal first cousin   Breast cancer Cousin 69       paternal first cousin   Healthy Half-Brother    Healthy Half-Sister     ALLERGIES:  is allergic to gabapentin, valsartan, and penicillins.  MEDICATIONS:  Current Outpatient Medications  Medication Sig Dispense Refill   losartan-hydrochlorothiazide (HYZAAR) 100-25 MG tablet Take 1 tablet by mouth daily.     methocarbamol (ROBAXIN) 500 MG tablet Take 1 tablet (501 mg total) by mouth daily as needed for muscle spasms. 30 tablet 0   tiZANidine (ZANAFLEX) 4 MG tablet Take 4 mg by mouth at bedtime as needed.     No current facility-administered medications for this visit.    REVIEW OF SYSTEMS:   Constitutional: Denies fevers, chills or abnormal night sweats Eyes: Denies blurriness of vision, double vision or watery eyes Ears, nose, mouth, throat, and face: Denies mucositis or sore throat Respiratory: Denies cough, dyspnea or wheezes Cardiovascular: Denies palpitation, chest discomfort or lower extremity swelling Gastrointestinal:  Denies nausea, heartburn or change in bowel habits Skin: Denies abnormal skin rashes Lymphatics: Denies new lymphadenopathy or easy bruising Neurological:Denies numbness, tingling or new weaknesses Behavioral/Psych: Mood is stable, no new changes  Breast: Denies any palpable lumps or discharge All other systems were reviewed with the patient and are negative.  PHYSICAL EXAMINATION: ECOG PERFORMANCE STATUS: 0 - Asymptomatic  Vitals:   05/19/23 1359  BP: (!)  144/70  Pulse: 86  Resp: 16  Temp: 97.9 F (36.6 C)  SpO2: 99%    Filed Weights   05/19/23 1359  Weight: (!) 302 lb 6.4 oz (137.2 kg)    General appearance: Alert, oriented and in no acute distress.   Breast: Bilateral breasts inspected and palpated.  She is status post bilateral reduction mammoplasty.  Left breast with skin changes consistent with postradiation.  Some tenderness along the surgical scar but no obvious lumps concerning for recurrence.  No regional adenopathy LLE slightly  swollen, chronic  LABORATORY DATA:  I have reviewed the data as listed No results found for: "WBC", "HGB", "HCT", "MCV", "PLT" No results found for: "NA", "K", "CL", "CO2"  RADIOGRAPHIC STUDIES: I have personally reviewed the radiological reports and agreed with the findings in the report.  Total time spent: 45 minutes All questions were answered. The patient knows to call the clinic with any problems, questions or concerns.    Rachel Moulds, MD 05/19/23

## 2023-05-20 ENCOUNTER — Encounter: Payer: Self-pay | Admitting: Sports Medicine

## 2023-05-20 NOTE — Telephone Encounter (Signed)
 Note sent via mychart

## 2023-05-25 ENCOUNTER — Encounter: Payer: Self-pay | Admitting: Sports Medicine

## 2023-06-04 NOTE — Progress Notes (Deleted)
    Ben Jackson D.[facility] [address] [place] [address] Phone: [phone]   Assessment and Plan:     There are no diagnoses linked to this encounter.  ***   Pertinent previous records reviewed include ***    Follow Up: ***     Subjective:   I, Olivia [name], am serving as a Neurosurgeon for Doctor Ulysees Gander   Chief Complaint: right ankle pain    HPI:  02/05/2023 Patient states right knee down to her ankle . Pain for about 2 months she did a lot of driving for the trip. Tylenol for the pain , lateral knee pain down. Tiger balm helps. No numbness or tingling. Constant pain 2-3/10. Pain is the worse at night when she is trying to go to sleep. She is not able to sleep through the night. She will have to place a pillow between her knees to get relief.    05/18/2023 Patient states pain has been getting worse. Feels like leg is shutting down on her . She isnt able to sleep through the night. Pain starts at the hip and goes down the whole leg. Decreased ROM. When she lifts her leg to turnover she has pain relief but when she brings it back down the pain is back. Pain got really bad a week ago. Ice  helped a lot. Pain meds/muscle relaxer's dont help. Tramadol helps ease the pain, she is able to get a little sleep. Think she is allergic to steroid    06/08/2023 Patient states   Relevant Historical Information: Hypertension  Additional pertinent review of systems negative.   Current Outpatient Medications:    losartan-hydrochlorothiazide (HYZAAR) 100-25 MG tablet, Take 1 tablet by mouth daily., Disp: , Rfl:    methocarbamol (ROBAXIN) 500 MG tablet, Take 1 tablet (501 mg total) by mouth daily as needed for muscle spasms., Disp: 30 tablet, Rfl: 0   tiZANidine (ZANAFLEX) 4 MG tablet, Take 4 mg by mouth at bedtime as needed., Disp: , Rfl:    Objective:     There were no vitals filed for this visit.    There is no height or weight on file  to calculate BMI.    Physical Exam:    ***   Electronically signed by:  Marshall Skeeter D.[facility] 7:29 AM 06/04/23

## 2023-06-08 ENCOUNTER — Ambulatory Visit: Admitting: Sports Medicine

## 2023-06-11 ENCOUNTER — Ambulatory Visit (HOSPITAL_BASED_OUTPATIENT_CLINIC_OR_DEPARTMENT_OTHER): Payer: Managed Care, Other (non HMO) | Admitting: Cardiovascular Disease

## 2023-06-16 ENCOUNTER — Ambulatory Visit: Admitting: Sports Medicine

## 2023-11-05 ENCOUNTER — Ambulatory Visit
Admission: RE | Admit: 2023-11-05 | Discharge: 2023-11-05 | Disposition: A | Discharge status: Home / Self Care | Source: Ambulatory Visit | Attending: Hematology and Oncology | Admitting: Hematology and Oncology

## 2023-11-05 DIAGNOSIS — Z853 Personal history of malignant neoplasm of breast: Secondary | ICD-10-CM

## 2024-05-19 ENCOUNTER — Ambulatory Visit: Admitting: Hematology and Oncology
